# Patient Record
Sex: Female | Born: 1950 | Race: White | Hispanic: No | Smoking: Never smoker
Health system: Southern US, Community
[De-identification: ages and names within clinical notes are randomized; demographics above are authoritative.]

## PROBLEM LIST (undated history)

## (undated) DIAGNOSIS — E559 Vitamin D deficiency, unspecified: Secondary | ICD-10-CM

## (undated) DIAGNOSIS — R001 Bradycardia, unspecified: Secondary | ICD-10-CM

## (undated) DIAGNOSIS — E7800 Pure hypercholesterolemia, unspecified: Secondary | ICD-10-CM

## (undated) DIAGNOSIS — N6019 Diffuse cystic mastopathy of unspecified breast: Secondary | ICD-10-CM

## (undated) DIAGNOSIS — I341 Nonrheumatic mitral (valve) prolapse: Secondary | ICD-10-CM

## (undated) DIAGNOSIS — N200 Calculus of kidney: Secondary | ICD-10-CM

## (undated) DIAGNOSIS — Z124 Encounter for screening for malignant neoplasm of cervix: Secondary | ICD-10-CM

## (undated) DIAGNOSIS — R87619 Unspecified abnormal cytological findings in specimens from cervix uteri: Secondary | ICD-10-CM

## (undated) DIAGNOSIS — Z800 Family history of malignant neoplasm of digestive organs: Secondary | ICD-10-CM

## (undated) DIAGNOSIS — I499 Cardiac arrhythmia, unspecified: Secondary | ICD-10-CM

## (undated) DIAGNOSIS — G43909 Migraine, unspecified, not intractable, without status migrainosus: Secondary | ICD-10-CM

## (undated) DIAGNOSIS — Z780 Asymptomatic menopausal state: Secondary | ICD-10-CM

## (undated) DIAGNOSIS — H409 Unspecified glaucoma: Secondary | ICD-10-CM

## (undated) DIAGNOSIS — K648 Other hemorrhoids: Secondary | ICD-10-CM

## (undated) DIAGNOSIS — Z9289 Personal history of other medical treatment: Secondary | ICD-10-CM

## (undated) DIAGNOSIS — M810 Age-related osteoporosis without current pathological fracture: Secondary | ICD-10-CM

## (undated) DIAGNOSIS — Z803 Family history of malignant neoplasm of breast: Secondary | ICD-10-CM

## (undated) DIAGNOSIS — I4891 Unspecified atrial fibrillation: Secondary | ICD-10-CM

## (undated) DIAGNOSIS — J309 Allergic rhinitis, unspecified: Secondary | ICD-10-CM

## (undated) DIAGNOSIS — Z8041 Family history of malignant neoplasm of ovary: Secondary | ICD-10-CM

## (undated) DIAGNOSIS — H40003 Preglaucoma, unspecified, bilateral: Secondary | ICD-10-CM

## (undated) HISTORY — DX: Unspecified abnormal cytological findings in specimens from cervix uteri: R87.619

## (undated) HISTORY — DX: Family history of malignant neoplasm of ovary: Z80.41

## (undated) HISTORY — DX: Asymptomatic menopausal state: Z78.0

## (undated) HISTORY — DX: Calculus of kidney: N20.0

## (undated) HISTORY — PX: EYE SURGERY: SHX253

## (undated) HISTORY — DX: Preglaucoma, unspecified, bilateral: H40.003

## (undated) HISTORY — DX: Diffuse cystic mastopathy of unspecified breast: N60.19

## (undated) HISTORY — DX: Migraine, unspecified, not intractable, without status migrainosus: G43.909

## (undated) HISTORY — PX: OTHER SURGICAL HISTORY: SHX169

## (undated) HISTORY — DX: Personal history of other medical treatment: Z92.89

## (undated) HISTORY — PX: BREAST CYST ASPIRATION: SHX578

## (undated) HISTORY — DX: Age-related osteoporosis without current pathological fracture: M81.0

## (undated) HISTORY — DX: Nonrheumatic mitral (valve) prolapse: I34.1

## (undated) HISTORY — DX: Pure hypercholesterolemia, unspecified: E78.00

## (undated) HISTORY — DX: Family history of malignant neoplasm of digestive organs: Z80.0

## (undated) HISTORY — DX: Encounter for screening for malignant neoplasm of cervix: Z12.4

## (undated) HISTORY — DX: Vitamin D deficiency, unspecified: E55.9

## (undated) HISTORY — DX: Bradycardia, unspecified: R00.1

## (undated) HISTORY — PX: COLONOSCOPY: SHX174

## (undated) HISTORY — DX: Allergic rhinitis, unspecified: J30.9

## (undated) HISTORY — DX: Other hemorrhoids: K64.8

## (undated) HISTORY — DX: Family history of malignant neoplasm of breast: Z80.3

## (undated) HISTORY — DX: Unspecified atrial fibrillation: I48.91

## (undated) HISTORY — DX: Unspecified glaucoma: H40.9

---

## 1984-03-20 DIAGNOSIS — N200 Calculus of kidney: Secondary | ICD-10-CM

## 1984-03-20 HISTORY — DX: Calculus of kidney: N20.0

## 1997-03-20 DIAGNOSIS — R87619 Unspecified abnormal cytological findings in specimens from cervix uteri: Secondary | ICD-10-CM

## 1997-03-20 HISTORY — DX: Unspecified abnormal cytological findings in specimens from cervix uteri: R87.619

## 2000-02-21 ENCOUNTER — Encounter: Payer: Self-pay | Admitting: *Deleted

## 2000-02-21 ENCOUNTER — Encounter: Admission: RE | Admit: 2000-02-21 | Discharge: 2000-02-21 | Payer: Self-pay | Admitting: *Deleted

## 2000-05-08 LAB — HM COLONOSCOPY: HM Colonoscopy: NORMAL

## 2000-06-05 ENCOUNTER — Ambulatory Visit (HOSPITAL_COMMUNITY): Admission: RE | Admit: 2000-06-05 | Discharge: 2000-06-05 | Payer: Self-pay | Admitting: Gastroenterology

## 2001-03-18 ENCOUNTER — Other Ambulatory Visit: Admission: RE | Admit: 2001-03-18 | Discharge: 2001-03-18 | Payer: Self-pay | Admitting: *Deleted

## 2001-03-20 HISTORY — PX: CERVICAL POLYPECTOMY: SHX88

## 2001-11-18 HISTORY — PX: BREAST FIBROADENOMA SURGERY: SHX580

## 2002-09-08 ENCOUNTER — Other Ambulatory Visit: Admission: RE | Admit: 2002-09-08 | Discharge: 2002-09-08 | Payer: Self-pay

## 2003-11-24 ENCOUNTER — Other Ambulatory Visit: Admission: RE | Admit: 2003-11-24 | Discharge: 2003-11-24 | Payer: Self-pay | Admitting: Family Medicine

## 2004-01-19 HISTORY — PX: OTHER SURGICAL HISTORY: SHX169

## 2005-01-24 ENCOUNTER — Other Ambulatory Visit: Admission: RE | Admit: 2005-01-24 | Discharge: 2005-01-24 | Payer: Self-pay | Admitting: Family Medicine

## 2005-01-31 ENCOUNTER — Encounter: Admission: RE | Admit: 2005-01-31 | Discharge: 2005-01-31 | Payer: Self-pay | Admitting: Family Medicine

## 2007-03-21 DIAGNOSIS — E559 Vitamin D deficiency, unspecified: Secondary | ICD-10-CM

## 2007-03-21 HISTORY — DX: Vitamin D deficiency, unspecified: E55.9

## 2008-03-20 HISTORY — PX: OTHER SURGICAL HISTORY: SHX169

## 2009-04-13 LAB — HM PAP SMEAR: HM Pap smear: NORMAL

## 2009-04-14 DIAGNOSIS — Z124 Encounter for screening for malignant neoplasm of cervix: Secondary | ICD-10-CM

## 2009-04-14 HISTORY — DX: Encounter for screening for malignant neoplasm of cervix: Z12.4

## 2009-05-18 HISTORY — PX: DILATION AND CURETTAGE OF UTERUS: SHX78

## 2009-06-11 ENCOUNTER — Ambulatory Visit (HOSPITAL_COMMUNITY): Admission: RE | Admit: 2009-06-11 | Discharge: 2009-06-11 | Payer: Self-pay | Admitting: Obstetrics and Gynecology

## 2009-09-07 DIAGNOSIS — Z9289 Personal history of other medical treatment: Secondary | ICD-10-CM

## 2009-09-07 HISTORY — DX: Personal history of other medical treatment: Z92.89

## 2010-06-13 LAB — COMPREHENSIVE METABOLIC PANEL
ALT: 25 U/L (ref 0–35)
AST: 30 U/L (ref 0–37)
Albumin: 4.3 g/dL (ref 3.5–5.2)
Alkaline Phosphatase: 49 U/L (ref 39–117)
BUN: 13 mg/dL (ref 6–23)
CO2: 25 mEq/L (ref 19–32)
Calcium: 9 mg/dL (ref 8.4–10.5)
Chloride: 104 mEq/L (ref 96–112)
Creatinine, Ser: 0.87 mg/dL (ref 0.4–1.2)
GFR calc Af Amer: >60 mL/min (ref >60)
GFR calc non Af Amer: >60 mL/min (ref >60)
Glucose, Bld: 86 mg/dL (ref 70–99)
Potassium: 3.6 mEq/L (ref 3.5–5.1)
Sodium: 137 mEq/L (ref 135–145)
Total Bilirubin: 0.5 mg/dL (ref 0.3–1.2)
Total Protein: 7.6 g/dL (ref 6.0–8.3)

## 2010-06-13 LAB — CBC
HCT: 40.1 % (ref 36.0–46.0)
Hemoglobin: 13.4 g/dL (ref 12.0–15.0)
MCHC: 33.4 g/dL (ref 30.0–36.0)
MCV: 93.1 fL (ref 78.0–100.0)
Platelets: 306 10*3/uL (ref 150–400)
RBC: 4.3 MIL/uL (ref 3.87–5.11)
RDW: 13.1 % (ref 11.5–15.5)
WBC: 6 10*3/uL (ref 4.0–10.5)

## 2010-08-05 NOTE — Procedures (Signed)
[place]. [facility]  Patient:    Paige Klein, HEIDEL                  MRN: [id number] Proc. Date: 06/05/00 Adm. Date:  [id number] Attending:  Charna Elizabeth CC:         Heather Roberts, M.D.   Procedure Report  DATE OF BIRTH:  1950/05/04.  PROCEDURE:  Colonoscopy.  ENDOSCOPIST:  Anselmo Rod, M.D.  INSTRUMENT USED:  Olympus video colonoscope.  INDICATION FOR PROCEDURE:  Guaiac-positive stools in a 60 year old white female.  Rule out colonic polyps, masses, hemorrhoids, etc.  PREPROCEDURE PREPARATION:  Informed consent was procured from the patient. The patient was fasted for eight hours prior to the procedure and prepped with a bottle of magnesium citrate and a gallon of NuLytely the night prior to the procedure.  She also received 1 g of Ancef and 80 mg of gentamicin prior to the procedure.  PREPROCEDURE PHYSICAL:  VITAL SIGNS:  The patient had stable vital signs.  NECK:  Supple.  CHEST:  Clear to auscultation.  S1, S2 regular.  ABDOMEN:  Soft with normal abdominal bowel sounds.  DESCRIPTION OF PROCEDURE:  The patient was placed in the left lateral decubitus position and sedated with 50 mg of Demerol and 5 mg of Versed intravenously.  Once the patient was adequately sedate and maintained on low-flow oxygen and continuous cardiac monitoring, the Olympus video colonoscope was advanced from the rectum to the cecum without difficulty. The entire colonic mucosa up to the cecum appeared healthy with normal vascular pattern.  Small internal hemorrhoids were appreciated on retroflexion.  No masses, polyps, erosions, ulcerations, or diverticula were seen.  IMPRESSION:  Essentially healthy-appearing colon except for small, nonbleeding internal hemorrhoids.  RECOMMENDATIONS: 1. The patient has been advised to increase the fluid and fiber in her diet. 2. Repeat guaiac testing will be done on an outpatient basis and further    recommendations  made as needed. DD:  06/05/00 TD:  06/05/00 Job: [id number]

## 2010-10-20 ENCOUNTER — Ambulatory Visit (INDEPENDENT_AMBULATORY_CARE_PROVIDER_SITE_OTHER): Payer: BC Managed Care – PPO | Admitting: Family Medicine

## 2010-10-20 ENCOUNTER — Encounter: Payer: Self-pay | Admitting: Family Medicine

## 2010-10-20 VITALS — BP 100/60 | HR 60 | Ht 68.0 in | Wt 138.0 lb

## 2010-10-20 DIAGNOSIS — M810 Age-related osteoporosis without current pathological fracture: Secondary | ICD-10-CM

## 2010-10-20 DIAGNOSIS — Z0000 Encounter for general adult medical examination without abnormal findings: Secondary | ICD-10-CM

## 2010-10-20 DIAGNOSIS — E559 Vitamin D deficiency, unspecified: Secondary | ICD-10-CM | POA: Insufficient documentation

## 2010-10-20 LAB — POCT URINALYSIS DIPSTICK
Bilirubin, UA: NEGATIVE
Ketones, UA: NEGATIVE
Leukocytes, UA: NEGATIVE
Protein, UA: NEGATIVE
Spec Grav, UA: 1.02
pH, UA: 5

## 2010-10-20 MED ORDER — ALENDRONATE SODIUM 70 MG PO TABS: ORAL_TABLET | ORAL | Status: DC

## 2010-10-20 NOTE — Progress Notes (Signed)
Paige Klein is a 60 y.o. female who presents for a complete physical.  She has the following concerns:  She has been off the fosamax for a few months--ran out.  She took Actonel x 6 years, subsequently changed to Fosamax (insurance reasons).  She thinks she's been on medications for about 10 years.  For many years she took med with well water, and it was felt it wasn't being absorbed, bone density continued to decline.  Stabilized since changing to taking it with distilled water.  Complaining of some pain--L hip (better after getting some exercises).  Very sedentary. Pain lateral hip, intermittent. No pain with weight bearing, no pain in groin  Also takes compounded progesterone (bi-est (6:4)+progest+test 0.3mg +2%+0.025mg   1cc topically BID (last filled 10/07/10).  Still has some vaginal dryness  Immunization History  Administered Date(s) Administered  . Td 10/04/1990, 03/18/2001   Last Pap smear: 03/2009 Last mammogram: 2011--due now Last colonoscopy: 05/2000--due Last DEXA: 08/2009 Ophtho: every 6 months Dentist: regular Exercise: sporadic  Past Medical History  Diagnosis Date  . Unspecified vitamin D deficiency 2009  . Glaucoma suspect of both eyes     high-normal intraocular pressures (Dr. Emily Filbert)  . Abnormal Pap smear of cervix 1999    ASGUS x 1  . Kidney stone 1986    right  . Osteoporosis   . Allergic rhinitis, cause unspecified     mold; and vasomotor rhinitis  . Migraine headache     resolved with menopause (menstrual migraines)  . MVP (mitral valve prolapse)     per echo  . Fibrocystic breast changes     and also breast calcifications (08/2008)  . Postmenopausal     Past Surgical History  Procedure Date  . Cervical polypectomy 2003    treated with cryosurgery  . Colonoscopy 05/2000    Dr. Loreta Ave (normal)  . Breast cyst aspiration     many (Dr. Maryagnes Amos)  . Endocervical cyst 12/96, 6/97    Dr Chevis Pretty  . Cardiolite (stress test) 11/05    Dr. Ermalinda Memos  . Cataract  surgery 2010    bilateral    History   Social History  . Marital Status: Divorced    Spouse Name: N/A    Number of Children: 0  . Years of Education: N/A   Occupational History  . school psychologist Agricultural engineer) [employer]   Social History Main Topics  . Smoking status: Never Smoker   . Smokeless tobacco: Never Used  . Alcohol Use: Yes     2-3 drinks once a week.  . Drug Use: No  . Sexually Active: Yes   Other Topics Concern  . Not on file   Social History Narrative  . No narrative on file    Family History  Problem Relation Age of Onset  . Hypertension Mother   . Arthritis Mother   . Cancer Mother     breast cancer in her 69's and 63's (bilateral)  . Hypertension Father   . Lichen planus Sister     Current outpatient prescriptions:latanoprost (XALATAN) 0.005 % ophthalmic solution, Place 1 drop into both eyes at bedtime.  , Disp: , Rfl: ;  Multiple Minerals-Vitamins (CITRACAL PLUS) TABS, Take 1 tablet by mouth daily.  , Disp: , Rfl: ;  Multiple Vitamins-Minerals (CENTRUM SILVER PO), Take 1 tablet by mouth daily.  , Disp: , Rfl: ;  alendronate (FOSAMAX) 70 MG tablet, Take with a full glass of water on an empty stomach., Disp: 4 tablet, Rfl:  11  Allergies  Allergen Reactions  . Cortisone Other (See Comments)    Entire body hurt from a cortisone shot.    ROS: The patient denies anorexia, fever, weight changes, headaches,  vision changes, decreased hearing, ear pain, sore throat, breast concerns, chest pain, palpitations, dizziness, syncope, dyspnea on exertion, cough, swelling, nausea, vomiting, diarrhea, constipation, abdominal pain, melena, hematochezia, indigestion/heartburn, hematuria,  Dysuria, postmenopausal bleeding, vaginal discharge, odor or itch, genital lesions, numbness, tingling, weakness, tremor, suspicious skin lesions, depression, anxiety, abnormal bleeding/bruising, or enlarged lymph nodes.  Occasional L hip pain.  Rare leakage of urine if  holds urine too long  PHYSICAL EXAM: BP 100/60  Pulse 60  Ht 5\' 8"  (1.727 m)  Wt 138 lb (62.596 kg)  BMI 20.98 kg/m2  LMP 03/20/2000  General Appearance:    Alert, cooperative, no distress, appears stated age  Head:    Normocephalic, without obvious abnormality, atraumatic  Eyes:    PERRL, conjunctiva/corneas clear, EOM's intact, fundi    benign  Ears:    Normal TM's and external ear canals  Nose:   Nares normal, mucosa normal, no drainage or sinus   tenderness  Throat:   Lips, mucosa, and tongue normal; teeth and gums normal  Neck:   Supple, no lymphadenopathy;  thyroid:  no   enlargement/tenderness/nodules; no carotid   bruit or JVD  Back:    Spine nontender, no curvature, ROM normal, no CVA     tenderness  Lungs:     Clear to auscultation bilaterally without wheezes, rales or     ronchi; respirations unlabored  Chest Wall:    No tenderness or deformity   Heart:    Regular rate and rhythm, S1 and S2 normal, no murmur, rub   or gallop.  Bradycardic (rate in low 50's at time of my exam)  Breast Exam:    No tenderness, masses, or nipple discharge or inversion.      No axillary lymphadenopathy  Abdomen:     Soft, non-tender, nondistended, normoactive bowel sounds,    no masses, no hepatosplenomegaly  Genitalia:    Normal external genitalia without lesions.  Mild atrophic changes. Bimanual exam performed (no speculum exam)-- Uterus and adnexa not enlarged, nontender, no masses.  Pap not performed  Rectal:    Normal tone, no masses or tenderness; guaiac negative stool  Extremities:   No clubbing, cyanosis or edema.  Nontender at ITB and trochanteric bursa  Pulses:   2+ and symmetric all extremities  Skin:   Skin color, texture, turgor normal, no rashes or lesions  Lymph nodes:   Cervical, supraclavicular, and axillary nodes normal  Neurologic:   CNII-XII intact, normal strength, sensation and gait; reflexes 2+ and symmetric throughout          Psych:   Normal mood, affect, hygiene and  grooming.    ASSESSMENT/PLAN: 1. Routine general medical examination at a health care facility  Visual acuity screening, POCT urinalysis dipstick  2. Osteoporosis  alendronate (FOSAMAX) 70 MG tablet  3. Unspecified vitamin D deficiency  Vitamin D 25 hydroxy   Discussed monthly self breast exams and yearly mammograms; at least 30 minutes of aerobic activity at least 5 days/week; proper sunscreen use reviewed; healthy diet, including goals of calcium and vitamin D intake and alcohol recommendations (less than or equal to 1 drink/day) reviewed; regular seatbelt use; changing batteries in smoke detectors.  Immunization recommendations discussed--zostavax recommended.  Colonoscopy recommendations reviewed--past due; to call and schedule.  Zostavax discussed and recommended--check insurance.  Continue yearly flu shots  Osteoporosis:  Getting suboptimal amts of calcium (only taking 250mg  of Ca and 125 IU of Vit D by only taking 1 Citrical daily). Restart fosamax and re-check DEXA 08/2011.  Monitor Vitamin D levels.  Normal lipids (borderline, but LDL always <130) in past, normal CBC, c-met and TSH done yearly so will skip other labs this year.  No recent abnormal paps, can screen q2-3 years  Hemoccult kit given

## 2010-10-20 NOTE — Patient Instructions (Addendum)
Ensure that you are getting 1500 mg of calcium daily (look closely at labels of vitamins/supplements)--this includes your dietary intake Vitamin D 1000 IU daily Re-start the Fosamax Re-check DEXA in 1 year Please schedule your mammogram and your colonoscopy Check with your insurance regarding Zostavax (shingles vaccine) coverage   HEALTH MAINTENANCE RECOMMENDATIONS:  It is recommended that you get at least 30 minutes of aerobic exercise at least 5 days/week (for weight loss, you may need as much as 60-90 minutes). This can be any activity that gets your heart rate up. This can be divided in 10-15 minute intervals if needed, but try and build up your endurance at least once a week.  Weight bearing exercise is also recommended twice weekly.  Eat a healthy diet with lots of vegetables, fruits and fiber.  "Colorful" foods have a lot of vitamins (ie green vegetables, tomatoes, red peppers, etc).  Limit sweet tea, regular sodas and alcoholic beverages, all of which has a lot of calories and sugar.  Up to 1 alcoholic drink daily may be beneficial for women (unless trying to lose weight, watch sugars).  Drink a lot of water.  Calcium recommendations are 1200-1500 mg daily (1500 mg for postmenopausal women or women without ovaries), and vitamin D 1000 IU daily.  This should be obtained from diet and/or supplements (vitamins), and calcium should not be taken all at once, but in divided doses.  Monthly self breast exams and yearly mammograms for women over the age of 40 is recommended.  Sunscreen of at least SPF 30 should be used on all sun-exposed parts of the skin when outside between the hours of 10 am and 4 pm (not just when at beach or pool, but even with exercise, golf, tennis, and yard work!)  Use a sunscreen that says "broad spectrum" so it covers both UVA and UVB rays, and make sure to reapply every 1-2 hours.  Remember to change the batteries in your smoke detectors when changing your clock times  in the spring and fall.  Use your seat belt every time you are in a car, and please drive safely and not be distracted with cell phones and texting while driving.  Return stool cards

## 2010-10-21 ENCOUNTER — Encounter: Payer: Self-pay | Admitting: Family Medicine

## 2010-10-21 LAB — VITAMIN D 25 HYDROXY (VIT D DEFICIENCY, FRACTURES): Vit D, 25-Hydroxy: 30 ng/mL (ref 30–89)

## 2010-11-22 ENCOUNTER — Encounter: Payer: Self-pay | Admitting: Family Medicine

## 2010-11-22 ENCOUNTER — Other Ambulatory Visit: Payer: BC Managed Care – PPO

## 2010-11-22 DIAGNOSIS — K921 Melena: Secondary | ICD-10-CM

## 2010-11-22 LAB — POC HEMOCCULT BLD/STL (HOME/3-CARD/SCREEN)
Card #2 Fecal Occult Blod, POC: NEGATIVE
Card #3 Fecal Occult Blood, POC: NEGATIVE
Fecal Occult Blood, POC: NEGATIVE

## 2010-11-23 DIAGNOSIS — Z9289 Personal history of other medical treatment: Secondary | ICD-10-CM

## 2010-11-23 HISTORY — DX: Personal history of other medical treatment: Z92.89

## 2010-12-13 ENCOUNTER — Telehealth: Payer: Self-pay | Admitting: Family Medicine

## 2010-12-13 NOTE — Telephone Encounter (Signed)
Please see if pt has had her mammogram done.  Must have yearly mammograms in order to get refills of hormones.  I see order in computer--I don't have chart to see if paper copy came across.  If had mammogram recently (and is UTD), then okay to refill until next mammogram is due (1 year)

## 2010-12-13 NOTE — Telephone Encounter (Signed)
Called patient to see whether or not she was UTD on her mammogram, had to leave message on patient's answering machine.

## 2010-12-14 ENCOUNTER — Telehealth: Payer: Self-pay | Admitting: Family Medicine

## 2010-12-14 NOTE — Telephone Encounter (Signed)
Pt called and said she just had a mammogram in September 2012 at [place].  Please renew meds.

## 2010-12-15 ENCOUNTER — Other Ambulatory Visit: Payer: Self-pay | Admitting: *Deleted

## 2010-12-15 DIAGNOSIS — Z780 Asymptomatic menopausal state: Secondary | ICD-10-CM

## 2010-12-15 MED ORDER — AMBULATORY NON FORMULARY MEDICATION: 1.0000 mL | Freq: Two times a day (BID) | Status: DC

## 2010-12-15 NOTE — Telephone Encounter (Signed)
rx faxed to [facility] @ [phone]. Bi-est(6:4)+Progest+Test0.3+ 2%+0.025mg  Cream. 60quantity apply 1cc topically twice a day(rotate sites)

## 2011-02-06 ENCOUNTER — Telehealth: Payer: Self-pay | Admitting: Family Medicine

## 2011-02-06 NOTE — Telephone Encounter (Signed)
OV here is recommended (last seen/discussed in August)

## 2011-02-06 NOTE — Telephone Encounter (Signed)
Called patient and scheduled appt to see Dr.Knapp 02/22/11 @ 2:45pm.

## 2011-02-20 ENCOUNTER — Encounter: Payer: Self-pay | Admitting: Internal Medicine

## 2011-02-22 ENCOUNTER — Ambulatory Visit (INDEPENDENT_AMBULATORY_CARE_PROVIDER_SITE_OTHER): Payer: BC Managed Care – PPO | Admitting: Family Medicine

## 2011-02-22 ENCOUNTER — Encounter: Payer: Self-pay | Admitting: Family Medicine

## 2011-02-22 VITALS — BP 116/70 | HR 72 | Ht 68.0 in | Wt 145.0 lb

## 2011-02-22 DIAGNOSIS — M25559 Pain in unspecified hip: Secondary | ICD-10-CM

## 2011-02-22 DIAGNOSIS — M25552 Pain in left hip: Secondary | ICD-10-CM

## 2011-02-22 MED ORDER — NAPROXEN 500 MG PO TABS: 500.0000 mg | ORAL_TABLET | Freq: Two times a day (BID) | ORAL | Status: DC

## 2011-02-22 NOTE — Progress Notes (Signed)
Patient presents with complaint of L hip pain for 6-8 months.  Pain is worse with sitting, but also notices while lying in bed.  Hurts to lay down on her left side.  Has some pain with she rotates her hip externally.  Has pain when the cat sits on her lap when she sits cross legged.  Has been taking yoga classes, and had some pain with abduction of the hip, keeping her leg elevated against gravity.  Described as an achey discomfort.  Has had some cramping in her thigh, although this is much improved, no other radiation of pain.  Pain was worse when she moved in the Spring, and then sat for prolonged periods of time at work.  She hasn't taken any kind of anti-inflammatories.  Ice helped some  Past Medical History  Diagnosis Date  . Unspecified vitamin D deficiency 2009  . Glaucoma suspect of both eyes     high-normal intraocular pressures (Dr. Emily Filbert)  . Abnormal Pap smear of cervix 1999    ASGUS x 1  . Kidney stone 1986    right  . Osteoporosis   . Allergic rhinitis, cause unspecified     mold; and vasomotor rhinitis  . Migraine headache     resolved with menopause (menstrual migraines)  . MVP (mitral valve prolapse)     per echo  . Fibrocystic breast changes     and also breast calcifications (08/2008)  . Postmenopausal   . Cataract   . Internal hemorrhoids     Past Surgical History  Procedure Date  . Cervical polypectomy 2003    treated with cryosurgery  . Colonoscopy 05/2000    Dr. Loreta Ave (normal)  . Breast cyst aspiration     many (Dr. Maryagnes Amos)  . Endocervical cyst 12/96, 6/97    Dr Chevis Pretty  . Cardiolite (stress test) 11/05    Dr. Ermalinda Memos  . Cataract surgery 2010    bilateral  . Breast fibroadenoma surgery 09-03    Dr.Gooden    History   Social History  . Marital Status: Divorced    Spouse Name: N/A    Number of Children: 0  . Years of Education: N/A   Occupational History  . school psychologist Agricultural engineer) [employer]   Social History Main Topics  .  Smoking status: Never Smoker   . Smokeless tobacco: Never Used  . Alcohol Use: Yes     2-3 drinks once a week.  . Drug Use: No  . Sexually Active: Yes   Other Topics Concern  . Not on file   Social History Narrative  . No narrative on file    Family History  Problem Relation Age of Onset  . Hypertension Mother   . Arthritis Mother   . Cancer Mother     breast cancer in her 43's and 81's (bilateral)  . Hypertension Father   . Lichen planus Sister   . Celiac disease Maternal Uncle   . Cancer Maternal Grandmother     cervical and ovarian  . Cancer Maternal Grandfather     pancreatic    Current outpatient prescriptions:alendronate (FOSAMAX) 70 MG tablet, Take with a full glass of water on an empty stomach., Disp: 4 tablet, Rfl: 11;  AMBULATORY NON FORMULARY MEDICATION, Apply 1 mL topically 2 (two) times daily. BI-EST(6:4)+PROGEST+TEST 0.3MG  = 2% + 0.025MG  CREAM. 60 QUANTITY., Disp: 60 mL, Rfl: PRN;  latanoprost (XALATAN) 0.005 % ophthalmic solution, Place 1 drop into both eyes at bedtime.  , Disp: ,  Rfl:  LUTEIN PO, Take by mouth.  , Disp: , Rfl: ;  Multiple Minerals-Vitamins (CITRACAL PLUS) TABS, Take 1 tablet by mouth daily.  , Disp: , Rfl: ;  Multiple Vitamins-Minerals (CENTRUM SILVER PO), Take 1 tablet by mouth daily.  , Disp: , Rfl: ;  naproxen (NAPROSYN) 500 MG tablet, Take 1 tablet (500 mg total) by mouth 2 (two) times daily with a meal., Disp: 30 tablet, Rfl: 1  Allergies  Allergen Reactions  . Cortisone Other (See Comments)    Entire body hurt from a cortisone shot.   ROS:  Denies fevers, URI symptoms, GI complaints, other joint complaints except as per HPI.  PHYSICAL EXAM: BP 116/70  Pulse 72  Ht 5\' 8"  (1.727 m)  Wt 145 lb (65.772 kg)  BMI 22.05 kg/m2  LMP 03/20/2000 Well developed, pleasant female in no distress Left hip:  Full range of motion without pain.  Area of discomfort is over trochanteric bursa and into thigh, but not tender on exam.  Some discomfort  with hip abduction.  Normal strength.  Spine, SI joints nontender.  DTR's 2+, normal strength and sensation Skin: intact, no rash  ASSESSMENT/PLAN:  1. Hip pain, left  naproxen (NAPROSYN) 500 MG tablet    L hip pain--suspect either tendinitis or bursitis.  Will treat with NSAID's.  NSAID precautions reviewed.  If ongoing pain, recommend she call for hip x-ray and referral to PT.  Will give NSAID's a chance first. Continue with stretching, ice/heat

## 2011-02-22 NOTE — Patient Instructions (Signed)
Take medicine with food.  Do not take any other pain medications other than Tylenol.  Stop the medication (or decrease dose) if you develop stomach pain. If ongoing hip pain despite trial of 10-14 days of this medication, call to get order for hip x-ray, and referral to physical therapy.  Try ice and/or heat, and continue stretches.  Avoid activities which worsen pain

## 2011-03-21 ENCOUNTER — Emergency Department (HOSPITAL_COMMUNITY)
Admission: EM | Admit: 2011-03-21 | Discharge: 2011-03-21 | Disposition: A | Discharge status: Home / Self Care | Payer: No Typology Code available for payment source | Attending: Emergency Medicine | Admitting: Emergency Medicine

## 2011-03-21 ENCOUNTER — Encounter (HOSPITAL_COMMUNITY): Payer: Self-pay | Admitting: Emergency Medicine

## 2011-03-21 ENCOUNTER — Emergency Department (HOSPITAL_COMMUNITY): Payer: No Typology Code available for payment source

## 2011-03-21 DIAGNOSIS — M542 Cervicalgia: Secondary | ICD-10-CM | POA: Insufficient documentation

## 2011-03-21 DIAGNOSIS — Z9889 Other specified postprocedural states: Secondary | ICD-10-CM | POA: Insufficient documentation

## 2011-03-21 DIAGNOSIS — S22009A Unspecified fracture of unspecified thoracic vertebra, initial encounter for closed fracture: Secondary | ICD-10-CM | POA: Insufficient documentation

## 2011-03-21 DIAGNOSIS — M4854XA Collapsed vertebra, not elsewhere classified, thoracic region, initial encounter for fracture: Secondary | ICD-10-CM

## 2011-03-21 DIAGNOSIS — M549 Dorsalgia, unspecified: Secondary | ICD-10-CM | POA: Insufficient documentation

## 2011-03-21 DIAGNOSIS — R079 Chest pain, unspecified: Secondary | ICD-10-CM | POA: Insufficient documentation

## 2011-03-21 DIAGNOSIS — M810 Age-related osteoporosis without current pathological fracture: Secondary | ICD-10-CM | POA: Insufficient documentation

## 2011-03-21 DIAGNOSIS — S22000A Wedge compression fracture of unspecified thoracic vertebra, initial encounter for closed fracture: Secondary | ICD-10-CM | POA: Insufficient documentation

## 2011-03-21 DIAGNOSIS — R109 Unspecified abdominal pain: Secondary | ICD-10-CM | POA: Insufficient documentation

## 2011-03-21 LAB — CBC
HCT: 38.5 % (ref 36.0–46.0)
MCHC: 33 g/dL (ref 30.0–36.0)
Platelets: 233 10*3/uL (ref 150–400)
RDW: 13 % (ref 11.5–15.5)
WBC: 5.7 10*3/uL (ref 4.0–10.5)

## 2011-03-21 LAB — DIFFERENTIAL
Basophils Absolute: 0 10*3/uL (ref 0.0–0.1)
Basophils Relative: 0 % (ref 0–1)
Lymphocytes Relative: 31 % (ref 12–46)
Monocytes Absolute: 0.4 10*3/uL (ref 0.1–1.0)
Neutro Abs: 3.3 10*3/uL (ref 1.7–7.7)
Neutrophils Relative %: 59 % (ref 43–77)

## 2011-03-21 LAB — COMPREHENSIVE METABOLIC PANEL
ALT: 28 U/L (ref 0–35)
AST: 35 U/L (ref 0–37)
Albumin: 3.9 g/dL (ref 3.5–5.2)
Chloride: 105 mEq/L (ref 96–112)
Creatinine, Ser: 0.89 mg/dL (ref 0.50–1.10)
Sodium: 139 mEq/L (ref 135–145)
Total Bilirubin: 0.2 mg/dL — ABNORMAL LOW (ref 0.3–1.2)

## 2011-03-21 MED ORDER — TRAMADOL HCL 50 MG PO TABS: 50.0000 mg | ORAL_TABLET | Freq: Four times a day (QID) | ORAL | Status: AC | PRN

## 2011-03-21 MED ORDER — IOHEXOL 300 MG/ML  SOLN
80.0000 mL | Freq: Once | INTRAMUSCULAR | Status: AC | PRN
  Administered 2011-03-21: 80 mL via INTRAVENOUS

## 2011-03-21 NOTE — Progress Notes (Signed)
Trauma page: chaplain spoke briefly with patient who stated that she was coping okay. Patient asked chaplain to let her companion, Bing Plume (also in the vehicle with her), know that she is here in the [facility]. Chaplain did so. Patient declined to have any family or friends contacted at this time. Follow up as needed.

## 2011-03-21 NOTE — ED Notes (Signed)
Pt logrolled from backboard. Pt denies pain. No step offs or crepitus.

## 2011-03-21 NOTE — ED Notes (Signed)
Patient transported to CT 

## 2011-03-21 NOTE — ED Notes (Signed)
Pt placed in Aspen collar per Neuro, pt tolerated well
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
## 2011-03-22 ENCOUNTER — Encounter: Payer: Self-pay | Admitting: Family Medicine

## 2011-06-29 ENCOUNTER — Ambulatory Visit (INDEPENDENT_AMBULATORY_CARE_PROVIDER_SITE_OTHER): Payer: BC Managed Care – PPO | Admitting: Family Medicine

## 2011-06-29 ENCOUNTER — Encounter: Payer: Self-pay | Admitting: Family Medicine

## 2011-06-29 VITALS — BP 118/72 | HR 68 | Ht 68.0 in | Wt 139.0 lb

## 2011-06-29 DIAGNOSIS — N952 Postmenopausal atrophic vaginitis: Secondary | ICD-10-CM

## 2011-06-29 NOTE — Patient Instructions (Signed)
Increase hormones to twice daily as was originally prescribed.  Hopefully symptoms should improve in the next 1-2 weeks.  There is no evidence of infection just atrophic changes.  Atrophic Vaginitis Atrophic vaginitis is a problem of low levels of estrogen in women. This problem can happen at any age. It is most common in women who have gone through menopause ("the change").  HOW WILL I KNOW IF I HAVE THIS PROBLEM? You may have:  Trouble with peeing (urinating), such as:   Going to the bathroom often.   A hard time holding your pee until you reach a bathroom.   Leaking pee.   Having pain when you pee.   Itching or a burning feeling.   Vaginal bleeding and spotting.   Pain during sex.   Dryness of the vagina.   A yellow, bad-smelling fluid (discharge) coming from the vagina.  HOW WILL MY DOCTOR CHECK FOR THIS PROBLEM?  During your exam, your doctor will likely find the problem.   If there is a vaginal fluid, it may be checked for infection.  HOW WILL THIS PROBLEM BE TREATED? Keep the vulvar skin as clean as possible. Moisturizers and lubricants can help with some of the symptoms. Estrogen replacement can help. There are 2 ways to take estrogen:  Systemic estrogen gets estrogen to your whole body. It takes many weeks or months before the symptoms get better.   You take an estrogen pill.   You use a skin patch. This is a patch that you put on your skin.   If you still have your uterus, your doctor may ask you to take a hormone. Talk to your doctor about the right medicine for you.   Estrogen cream.  This puts estrogen only at the part of your body where you apply it. The cream is put into the vagina or put on the vulvar skin. For some women, estrogen cream works faster than pills or the patch.  CAN ALL WOMEN WITH THIS PROBLEM USE ESTROGEN? No. Women with certain types of cancer, liver problems, or problems with blood clots should not take estrogen. Your doctor can help you  decide the best treatment for your symptoms. Document Released: 08/23/2007 Document Revised: 02/23/2011 Document Reviewed: 08/23/2007 [facility] Patient Information 2012 [place], [place].

## 2011-06-29 NOTE — Progress Notes (Signed)
Patient presents with complaint of vaginal irritation.  Feels internal, not just a skin irritation.  Symptoms have been going on for about a month.  Initially thought it was due to a change in soap, but ongoing symptoms despite changing back to previous soap.  Has some clear/white discharge, notices "moisture" in her panties, thinks it may be perspiration, doesn't seem to have discharge..  Denies itching, just some burning.  Denies any significant odor.  On topical HRT with testosterone---hasn't changed (has been using since approx 2005.), although admits to only using it once daily, when it has been prescribed to BID dosing.  She is in a monogamous relationship x 2 years.  Doesn't think there is possibility of STD.  Past Medical History  Diagnosis Date  . Unspecified vitamin D deficiency 2009  . Glaucoma suspect of both eyes     high-normal intraocular pressures (Dr. Emily Filbert)  . Abnormal Pap smear of cervix 1999    ASGUS x 1  . Kidney stone 1986    right  . Osteoporosis   . Allergic rhinitis, cause unspecified     mold; and vasomotor rhinitis  . Migraine headache     resolved with menopause (menstrual migraines)  . MVP (mitral valve prolapse)     per echo  . Fibrocystic breast changes     and also breast calcifications (08/2008)  . Postmenopausal   . Cataract   . Internal hemorrhoids    Past Surgical History  Procedure Date  . Cervical polypectomy 2003    treated with cryosurgery  . Colonoscopy 05/2000    Dr. Loreta Ave (normal)  . Breast cyst aspiration     many (Dr. Maryagnes Amos)  . Endocervical cyst 12/96, 6/97    Dr Chevis Pretty  . Cardiolite (stress test) 11/05    Dr. Ermalinda Memos  . Cataract surgery 2010    bilateral  . Breast fibroadenoma surgery 09-03    Dr.Gooden   History   Social History  . Marital Status: Divorced    Spouse Name: N/A    Number of Children: 0  . Years of Education: N/A   Occupational History  . school psychologist Agricultural engineer) [employer]   Social  History Main Topics  . Smoking status: Never Smoker   . Smokeless tobacco: Never Used  . Alcohol Use: Yes     2-3 drinks once a week.  . Drug Use: No  . Sexually Active: Yes   Other Topics Concern  . Not on file   Social History Narrative  . No narrative on file   Current Outpatient Prescriptions on File Prior to Visit  Medication Sig Dispense Refill  . alendronate (FOSAMAX) 70 MG tablet Take 70 mg by mouth every 7 (seven) days. Wed or Thurs Take with a full glass of water on an empty stomach.       . AMBULATORY NON FORMULARY MEDICATION Apply 1 mL topically 2 (two) times daily. BI-EST(6:4)+PROGEST+TEST 0.3MG  = 2% + 0.025MG  CREAM. 60 QUANTITY.  60 mL  PRN  . latanoprost (XALATAN) 0.005 % ophthalmic solution Place 1 drop into both eyes at bedtime.        . LUTEIN PO Take by mouth.        . Multiple Minerals-Vitamins (CITRACAL PLUS) TABS Take 1 tablet by mouth daily.        . Multiple Vitamins-Minerals (CENTRUM SILVER PO) Take 1 tablet by mouth daily.        . naproxen (NAPROSYN) 500 MG tablet Take 1 tablet (500 mg  total) by mouth 2 (two) times daily with a meal.  30 tablet  1   Allergies  Allergen Reactions  . Cortisone Other (See Comments)    Entire body hurt from a cortisone shot.  . Tramadol Rash   ROS:  Denies dysuria, fevers, skin rashes.  Denies fevers, URI symptoms, chest pain or shortness of breath.  Hip is much better (last seen in December with pain), only occasionally notices slight discomfort.  No longer using NSAIDs.  PHYSICAL EXAM: BP 118/72  Pulse 68  Ht 5\' 8"  (1.727 m)  Wt 139 lb (63.05 kg)  BMI 21.13 kg/m2  LMP 03/20/2000 Well developed, pleasant female in no distress External genitalia: Very slight perineal erythema.  Atrophic changes noted.  No vaginal discharge is seen, some atrophic vaginal changes.  ASSESSMENT/PLAN:  1. Atrophic vaginitis    Increase hormones to BID as was originally prescribed (has only been using once daily).  Hopefully symptoms  should improve in the next 1-2 weeks.  There is no evidence of infection just atrophic changes.

## 2011-07-07 ENCOUNTER — Telehealth: Payer: Self-pay | Admitting: Internal Medicine

## 2011-07-07 NOTE — Telephone Encounter (Signed)
Faxed in pharmacy form to [facility] with medication and directions with 4 refills. Wasn't sure how to put in computer.

## 2011-07-07 NOTE — Telephone Encounter (Signed)
Okay to refill x 4 (I don't know how to put this in computer). Thanks

## 2011-08-02 LAB — HM COLONOSCOPY

## 2011-11-02 ENCOUNTER — Other Ambulatory Visit: Payer: Self-pay | Admitting: Family Medicine

## 2012-02-08 ENCOUNTER — Telehealth: Payer: Self-pay | Admitting: Internal Medicine

## 2012-02-08 NOTE — Telephone Encounter (Signed)
Last CPE was 10/2010.  I see no record of mammo within the year.  I can't rx HRT cream without CPE and updated mammograms

## 2012-02-08 NOTE — Telephone Encounter (Signed)
Left messages on patient's home and cell numbers asking her to please return my call and the reason for my call.

## 2012-02-08 NOTE — Telephone Encounter (Signed)
Pt scheduled an appt in December with shane

## 2012-02-21 ENCOUNTER — Encounter: Payer: Self-pay | Admitting: Medical

## 2012-02-21 ENCOUNTER — Ambulatory Visit (INDEPENDENT_AMBULATORY_CARE_PROVIDER_SITE_OTHER): Payer: BC Managed Care – PPO | Admitting: Medical

## 2012-02-21 VITALS — BP 110/70 | HR 60 | Resp 14 | Ht 68.5 in | Wt 144.0 lb

## 2012-02-21 DIAGNOSIS — M810 Age-related osteoporosis without current pathological fracture: Secondary | ICD-10-CM

## 2012-02-21 DIAGNOSIS — Z780 Asymptomatic menopausal state: Secondary | ICD-10-CM

## 2012-02-21 DIAGNOSIS — Z23 Encounter for immunization: Secondary | ICD-10-CM

## 2012-02-21 DIAGNOSIS — Z0000 Encounter for general adult medical examination without abnormal findings: Secondary | ICD-10-CM

## 2012-02-21 DIAGNOSIS — T887XXA Unspecified adverse effect of drug or medicament, initial encounter: Secondary | ICD-10-CM

## 2012-02-21 DIAGNOSIS — R002 Palpitations: Secondary | ICD-10-CM

## 2012-02-21 DIAGNOSIS — E559 Vitamin D deficiency, unspecified: Secondary | ICD-10-CM

## 2012-02-21 DIAGNOSIS — T50905A Adverse effect of unspecified drugs, medicaments and biological substances, initial encounter: Secondary | ICD-10-CM

## 2012-02-21 DIAGNOSIS — Z1239 Encounter for other screening for malignant neoplasm of breast: Secondary | ICD-10-CM

## 2012-02-21 LAB — POCT URINALYSIS DIPSTICK
Bilirubin, UA: NEGATIVE
Ketones, UA: NEGATIVE
Leukocytes, UA: NEGATIVE
Spec Grav, UA: 1.015

## 2012-02-21 LAB — CBC WITH DIFFERENTIAL/PLATELET
Basophils Relative: 1 % (ref 0–1)
Eosinophils Absolute: 0.1 10*3/uL (ref 0.0–0.7)
Eosinophils Relative: 2 % (ref 0–5)
HCT: 38.8 % (ref 36.0–46.0)
Hemoglobin: 13 g/dL (ref 12.0–15.0)
MCH: 30.6 pg (ref 26.0–34.0)
MCHC: 33.5 g/dL (ref 30.0–36.0)
MCV: 91.3 fL (ref 78.0–100.0)
Monocytes Absolute: 0.4 10*3/uL (ref 0.1–1.0)
Monocytes Relative: 8 % (ref 3–12)

## 2012-02-21 LAB — BASIC METABOLIC PANEL
CO2: 26 mEq/L (ref 19–32)
Chloride: 107 mEq/L (ref 96–112)
Sodium: 141 mEq/L (ref 135–145)

## 2012-02-21 NOTE — Progress Notes (Signed)
Subjective: Paige Klein is a 62 y.o. female who presents for a complete physical.  Saw Dr. Lynelle Doctor last year for physical, has not had a consistent routine PCM the last few years, has seen various providers, Dr. Merrilee Jansky, Dr. Wynetta Emery.  She has the following concerns:  She has been off the fosamax for a few months.  She took Actonel x 6 years, subsequently changed to Fosamax (insurance reasons).  She thinks she's been on medications for about 10 years.  For many years she took med with well water, and it was felt it wasn't being absorbed, bone density continued to decline.  Stabilized since changing to taking it with distilled water.  However, she stopped Fosamax in September after reading about possible adverse effects.  She says since stopping Fosamax, has no more GI upset, bone pains/aches are better, and left hip pain is better.  Ongoing left hip pain, but seems to be better after stopping Fosamax few months ago.    Postmenopausal with atrophic vaginitis, uses compounded progesterone (bi-est (6:4)+progest+test 0.3mg +2%+0.025mg   1cc topically BID  She was advised last year to check on zostavax coverage, but she has not done this.  She sees eye doctor regularly, on medication for suspected glaucoma.   Sees dentist regularly.  Has done yoga for 25 years, was exercising regularly, but lately with added stress, hasn't been exercising as much.  Past Medical History  Diagnosis Date  . Unspecified vitamin D deficiency 2009  . Glaucoma suspect of both eyes     high-normal intraocular pressures (Dr. Emily Filbert)  . Abnormal Pap smear of cervix 1999    ASGUS x 1  . Kidney stone 1986    right  . Osteoporosis   . Allergic rhinitis, cause unspecified     mold; and vasomotor rhinitis  . Migraine headache     resolved with menopause (menstrual migraines)  . MVP (mitral valve prolapse)     per echo  . Fibrocystic breast changes     and also breast calcifications (08/2008)  . Postmenopausal     on  compounded cream daily  . Cataract   . Internal hemorrhoids   . H/O mammogram 11/23/10    normal  . Pap smear for cervical cancer screening 04/14/09    negative for malignancy, +atrophic changes  . H/O bone density study 09/07/09    08/16/07, 08/08/05; osteoporosis  . Hypercholesteremia     borderline per NMR lipoprofile 2008, 2007    Past Surgical History  Procedure Date  . Cervical polypectomy 2003    treated with cryosurgery  . Colonoscopy 08/02/11; 05/2000    Dr. Loreta Ave - rectal polyps 2013  . Breast cyst aspiration     many (Dr. Maryagnes Amos)  . Endocervical cyst 12/96, 6/97    Dr Chevis Pretty  . Cardiolite (stress test) 11/05    Dr. Ermalinda Memos  . Cataract surgery 2010    bilateral  . Breast fibroadenoma surgery 09-03    Dr.Gooden  . Dilation and curettage of uterus 05/2009    uterine polyp, bleeding    History   Social History  . Marital Status: Divorced    Spouse Name: N/A    Number of Children: 0  . Years of Education: N/A   Occupational History  . school psychologist Agricultural engineer) [employer]   Social History Main Topics  . Smoking status: Never Smoker   . Smokeless tobacco: Never Used  . Alcohol Use: Yes     Comment: 2-3 drinks once a week.  Marland Kitchen  Drug Use: No  . Sexually Active: Yes   Other Topics Concern  . 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 Hypertension Mother   . Arthritis Mother   . Cancer Mother     breast cancer in her 83's and 60's (bilateral)  . Hypertension Father   . Other Father     died of pancreatitis  . Lichen planus Sister   . Celiac disease Maternal Uncle   . Cancer Maternal Grandmother     cervical and ovarian  . Cancer Maternal Grandfather     pancreatic    Current outpatient prescriptions:AMBULATORY NON FORMULARY MEDICATION, Apply 1 mL topically 2 (two) times daily.  BI-EST(6:4)+PROGEST+TEST 0.3MG  = 2% + 0.025MG  CREAM. 60 QUANTITY., Disp: 60 mL, Rfl: PRN;  latanoprost (XALATAN) 0.005 % ophthalmic solution, Place 1 drop into both eyes at bedtime.  , Disp: , Rfl: ;  LUTEIN PO, Take by mouth.  , Disp: , Rfl: ;  Multiple Minerals-Vitamins (CITRACAL PLUS) TABS, Take 1 tablet by mouth daily.  , Disp: , Rfl:  Multiple Vitamins-Minerals (CENTRUM SILVER PO), Take 1 tablet by mouth daily.  , Disp: , Rfl:   Allergies  Allergen Reactions  . Cortisone Other (See Comments)    Entire body hurt from a cortisone shot.  . Fosamax (Alendronate Sodium)     GI upset, bone pain; tolerated Actonel  . Tramadol Rash   Review of Systems Constitutional: -fever, -chills, -sweats, -unexpected -weight change,-fatigue ENT: -runny nose, -ear pain, -sore throat Cardiology:  -chest pain, -palpitations, -edema Respiratory: -cough, -shortness of breath, -wheezing Gastroenterology: -abdominal pain, -nausea, -vomiting, -diarrhea, -constipation  Hematology: -bleeding or bruising problems Urology: -dysuria, -difficulty urinating, -hematuria, -urinary frequency, -urgency Neurology: -headache, -weakness, -tingling, -numbness   Objective:  General Appearance:    Alert, cooperative, no distress, lean white female  Head:    Normocephalic, without obvious abnormality, atraumatic  Eyes:    PERRL, conjunctiva/corneas clear, EOM's intact  Ears:    Normal TM's and external ear canals  Nose:   Nares normal, mucosa normal, no drainage or sinus   tenderness  Throat:   Lips, mucosa, and tongue normal; teeth and gums normal  Neck:   Supple, no lymphadenopathy;  thyroid:  no   enlargement/tenderness/nodules; no carotid   bruit or JVD  Back:    Spine nontender, no curvature, ROM normal, no CVA     tenderness  Lungs:     Clear to auscultation bilaterally without wheezes, rales or     rhonchi; respirations unlabored  Chest Wall:    No tenderness or deformity   Heart:    Regular rate and rhythm, S1 and  S2 normal, no murmur, rub   or gallop.    Breast Exam:    No tenderness, masses, or nipple discharge or inversion.      No axillary lymphadenopathy  Abdomen:     Soft, non-tender, nondistended, normoactive bowel sounds,    no masses, no hepatosplenomegaly  Genitalia:    Normal external genitalia without lesions.  Mild atrophic changes. Bimanual exam performed (no speculum exam)-- Uterus and adnexa not enlarged, nontender, no masses.  Pap not performed  Rectal:    Normal tone, no masses or tenderness; guaiac negative stool  Extremities:   nontender joints and extremities, normal ROM, no obvious deformities or laxity, no clubbing, cyanosis or edema.  Pulses:   2+ and symmetric all extremities  Skin:   Skin color, texture, turgor normal.  scattered benign lesions.  Left medial breast with 2.33mm round cherry red lesion with irregular border but unchanged for years per pt, right upper abdomen with 4mm x 1mm raised oval stuck on appearing lesion c/w seborrheic keratosis  Lymph nodes:   Cervical, supraclavicular, and axillary nodes normal  Neurologic:   CNII-XII intact, normal strength, sensation and gait; reflexes 2+ and symmetric throughout          Psych:   Normal mood, affect, hygiene and grooming.     Adult ECG Report  Indication: palpitations  Rate: 51bpm  Rhythm: sinus bradycardia  QRS Axis: 51 degrees  PR Interval:  QRS Duration: 68ms  QTc:  Conduction Disturbances: none  Other Abnormalities: none  Patient's cardiac risk factors are: none.  EKG comparison: 09/2007    Narrative Interpretation: no significant change from 2009 EKG, sinus bradycardia   ASSESSMENT/PLAN: Encounter Diagnoses  Name Primary?  . Routine general medical examination at a health care facility Yes  . Need for prophylactic vaccination and inoculation against influenza   . Palpitation   . Screening for breast cancer   . Osteoporosis   . Vitamin D deficiency   . Adverse drug effect   .  Postmenopausal    Discussed diet, exercise, vaccinations, healthy lifestyle.  She is UTD on colonoscopy, repeat pap next year 2014.  Zostavax discussed and recommended--check insurance.  Continue yearly flu shots.  Flu vaccine, VIS and counseling given.  Palpitation - reassured.  Recheck if this continues  Referred for repeat yearly mammogram.  Hx/o multiple biopsies. Advised monthly self breast exams.  Osteoporosis, vit D deficiency:  Getting suboptimal amts of calcium and vitamin D, no different than a year ago.  Stressed the importance of 1500mg  Ca and 1000 Vit D daily.  Adverse effect of fosamax -  Repeat dexa.  Consider alternate therapy.    Postmenopausal - c/t compounded HRT.  discussed risks/benefits.   Refill sent.

## 2012-02-22 ENCOUNTER — Encounter: Payer: Self-pay | Admitting: Medical

## 2012-02-22 DIAGNOSIS — E559 Vitamin D deficiency, unspecified: Secondary | ICD-10-CM | POA: Insufficient documentation

## 2012-02-22 DIAGNOSIS — M810 Age-related osteoporosis without current pathological fracture: Secondary | ICD-10-CM

## 2012-02-22 HISTORY — DX: Age-related osteoporosis without current pathological fracture: M81.0

## 2012-02-22 HISTORY — DX: Vitamin D deficiency, unspecified: E55.9

## 2012-02-22 NOTE — Patient Instructions (Addendum)
Overall your significant health concerns include Glaucoma, Osteoporosis and Vitamin D deficiency.  Recommendations:  Given your history of osteoporosis and risk for fracture due to decreased bone density, I recommend you take 1500mg  Calcium with Vitamin D 1000 units daily.  This is available OTC  We will repeat a bone density scan.   Depending upon results, we can consider if we need to continue a bisphosphonate medication.  Options include once monthly Boniva, once yearly Reclast, or even a nasal spray.    We have referred you for repeat mammogram.  Do monthly self breast exams  Check your insurance coverage for Shingles vaccine/Zostavax coverage.  We recommend you get this.  We will repeat pap smear in 2014.  We will refill your compounded hormone cream.    After reviewing your past cholesterol lab evaluations, I would just recommend eating a healthy diet, avoiding lots of high cholesterol foods such as red meat, lots of cheese, ice cream, egg yolks, etc.  I do recommend you consider taking Aspirin 81mg  daily for heart health.  I don't have a copy of your prior echocardiogram.  Chart shows a history of mitral valve prolapse.  I did not hear a murmur.  At this point, since you have not had any more palpitations, you don't have any particular worrisome symptoms.  However, if you want to get a baseline echocardiogram now to recheck the heart valves, we can set this up.  Let me know.  Continue routine follow up with your ophthalmologist for Glaucoma management

## 2012-02-23 ENCOUNTER — Telehealth: Payer: Self-pay | Admitting: Medical

## 2012-02-23 NOTE — Telephone Encounter (Signed)
pls send the script refill form

## 2012-02-26 ENCOUNTER — Telehealth: Payer: Self-pay | Admitting: Medical

## 2012-02-26 DIAGNOSIS — Z780 Asymptomatic menopausal state: Secondary | ICD-10-CM

## 2012-02-26 MED ORDER — AMBULATORY NON FORMULARY MEDICATION: 1.0000 mL | Freq: Two times a day (BID) | Status: DC

## 2012-02-26 NOTE — Telephone Encounter (Signed)
REORDER FAXED TO [facility]

## 2012-04-02 ENCOUNTER — Other Ambulatory Visit: Payer: Self-pay | Admitting: Radiology

## 2012-04-02 LAB — HM MAMMOGRAPHY: HM Mammogram: ABNORMAL

## 2012-04-03 ENCOUNTER — Encounter: Payer: Self-pay | Admitting: Medical

## 2012-04-09 ENCOUNTER — Encounter: Payer: Self-pay | Admitting: Internal Medicine

## 2012-08-14 ENCOUNTER — Ambulatory Visit (INDEPENDENT_AMBULATORY_CARE_PROVIDER_SITE_OTHER): Payer: BC Managed Care – PPO | Admitting: Family Medicine

## 2012-08-14 ENCOUNTER — Encounter: Payer: Self-pay | Admitting: Family Medicine

## 2012-08-14 VITALS — BP 118/72 | HR 56 | Temp 98.0°F | Ht 67.0 in | Wt 140.0 lb

## 2012-08-14 DIAGNOSIS — R0789 Other chest pain: Secondary | ICD-10-CM

## 2012-08-14 DIAGNOSIS — R071 Chest pain on breathing: Secondary | ICD-10-CM

## 2012-08-14 NOTE — Patient Instructions (Addendum)
Consider trial of heat and/or NSAIDs (aleve or ibuprofen) when in more discomfort. Continue stretches. If worsening pain, shortness of breath, or other symptoms develop, return for re-evaluation (which may include x-rays or CT)
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
## 2012-08-20 ENCOUNTER — Other Ambulatory Visit (INDEPENDENT_AMBULATORY_CARE_PROVIDER_SITE_OTHER): Payer: BC Managed Care – PPO

## 2012-08-20 DIAGNOSIS — Z2911 Encounter for prophylactic immunotherapy for respiratory syncytial virus (RSV): Secondary | ICD-10-CM

## 2012-08-20 DIAGNOSIS — Z23 Encounter for immunization: Secondary | ICD-10-CM

## 2012-09-18 ENCOUNTER — Telehealth: Payer: Self-pay | Admitting: Internal Medicine

## 2012-09-18 NOTE — Telephone Encounter (Signed)
Refill request for BI-EST+ Progesterone + Testosterone 0.3mg  +2% +0.025mg  [facility]

## 2012-09-18 NOTE — Telephone Encounter (Signed)
Called in to pharmacy and scheduled patient for CPE with Dr.Knapp for 02/24/13.

## 2012-09-18 NOTE — Telephone Encounter (Signed)
Looks like it was done at CPE with Shane in 02/2012, with "prn" refills.  Okay to refill until due for CPE again in 02/2013. Likely should schedule CPE now also, if plans to have it with me

## 2013-02-24 ENCOUNTER — Other Ambulatory Visit (HOSPITAL_COMMUNITY)
Admission: RE | Admit: 2013-02-24 | Discharge: 2013-02-24 | Disposition: A | Discharge status: Home / Self Care | Payer: BC Managed Care – PPO | Source: Ambulatory Visit | Attending: Family Medicine | Admitting: Family Medicine

## 2013-02-24 ENCOUNTER — Ambulatory Visit (INDEPENDENT_AMBULATORY_CARE_PROVIDER_SITE_OTHER): Payer: BC Managed Care – PPO | Admitting: Family Medicine

## 2013-02-24 ENCOUNTER — Encounter: Payer: Self-pay | Admitting: Family Medicine

## 2013-02-24 VITALS — BP 124/70 | HR 56 | Ht 67.0 in | Wt 139.0 lb

## 2013-02-24 DIAGNOSIS — Z1151 Encounter for screening for human papillomavirus (HPV): Secondary | ICD-10-CM | POA: Insufficient documentation

## 2013-02-24 DIAGNOSIS — E559 Vitamin D deficiency, unspecified: Secondary | ICD-10-CM

## 2013-02-24 DIAGNOSIS — Z0000 Encounter for general adult medical examination without abnormal findings: Secondary | ICD-10-CM

## 2013-02-24 DIAGNOSIS — Z79899 Other long term (current) drug therapy: Secondary | ICD-10-CM

## 2013-02-24 DIAGNOSIS — Z13220 Encounter for screening for lipoid disorders: Secondary | ICD-10-CM

## 2013-02-24 DIAGNOSIS — Z01419 Encounter for gynecological examination (general) (routine) without abnormal findings: Secondary | ICD-10-CM | POA: Insufficient documentation

## 2013-02-24 DIAGNOSIS — Z23 Encounter for immunization: Secondary | ICD-10-CM

## 2013-02-24 DIAGNOSIS — M810 Age-related osteoporosis without current pathological fracture: Secondary | ICD-10-CM

## 2013-02-24 LAB — POCT URINALYSIS DIPSTICK
Bilirubin, UA: NEGATIVE
Ketones, UA: NEGATIVE
Leukocytes, UA: NEGATIVE
Protein, UA: NEGATIVE

## 2013-02-24 NOTE — Patient Instructions (Addendum)
HEALTH MAINTENANCE RECOMMENDATIONS:  It is recommended that you get at least 30 minutes of aerobic exercise at least 5 days/week (for weight loss, you may need as much as 60-90 minutes). This can be any activity that gets your heart rate up. This can be divided in 10-15 minute intervals if needed, but try and build up your endurance at least once a week.  Weight bearing exercise is also recommended twice weekly.  Eat a healthy diet with lots of vegetables, fruits and fiber.  "Colorful" foods have a lot of vitamins (ie green vegetables, tomatoes, red peppers, etc).  Limit sweet tea, regular sodas and alcoholic beverages, all of which has a lot of calories and sugar.  Up to 1 alcoholic drink daily may be beneficial for women (unless trying to lose weight, watch sugars).  Drink a lot of water.  Calcium recommendations are 1200-1500 mg daily (1500 mg for postmenopausal women or women without ovaries), and vitamin D 1000 IU daily.  This should be obtained from diet and/or supplements (vitamins), and calcium should not be taken all at once, but in divided doses.  Monthly self breast exams and yearly mammograms for women over the age of 82 is recommended.  Sunscreen of at least SPF 30 should be used on all sun-exposed parts of the skin when outside between the hours of 10 am and 4 pm (not just when at beach or pool, but even with exercise, golf, tennis, and yard work!)  Use a sunscreen that says "broad spectrum" so it covers both UVA and UVB rays, and make sure to reapply every 1-2 hours.  Remember to change the batteries in your smoke detectors when changing your clock times in the spring and fall.  Use your seat belt every time you are in a car, and please drive safely and not be distracted with cell phones and texting while driving.   Plantar Fasciitis Plantar fasciitis is a common condition that causes foot pain. It is soreness (inflammation) of the band of tough fibrous tissue on the bottom of the  foot that runs from the heel bone (calcaneus) to the ball of the foot. The cause of this soreness may be from excessive standing, poor fitting shoes, running on hard surfaces, being overweight, having an abnormal walk, or overuse (this is common in runners) of the painful foot or feet. It is also common in aerobic exercise dancers and ballet dancers. SYMPTOMS  Most people with plantar fasciitis complain of:  Severe pain in the morning on the bottom of their foot especially when taking the first steps out of bed. This pain recedes after a few minutes of walking.  Severe pain is experienced also during walking following a long period of inactivity.  Pain is worse when walking barefoot or up stairs DIAGNOSIS   Your caregiver will diagnose this condition by examining and feeling your foot.  Special tests such as X-rays of your foot, are usually not needed. PREVENTION   Consult a sports medicine professional before beginning a new exercise program.  Walking programs offer a good workout. With walking there is a lower chance of overuse injuries common to runners. There is less impact and less jarring of the joints.  Begin all new exercise programs slowly. If problems or pain develop, decrease the amount of time or distance until you are at a comfortable level.  Wear good shoes and replace them regularly.  Stretch your foot and the heel cords at the back of the ankle (Achilles tendon) both  before and after exercise.  Run or exercise on even surfaces that are not hard. For example, asphalt is better than pavement.  Do not run barefoot on hard surfaces.  If using a treadmill, vary the incline.  Do not continue to workout if you have foot or joint problems. Seek professional help if they do not improve. HOME CARE INSTRUCTIONS   Avoid activities that cause you pain until you recover.  Use ice or cold packs on the problem or painful areas after working out.  Only take over-the-counter or  prescription medicines for pain, discomfort, or fever as directed by your caregiver.  Soft shoe inserts or athletic shoes with air or gel sole cushions may be helpful.  If problems continue or become more severe, consult a sports medicine caregiver or your own health care provider. Cortisone is a potent anti-inflammatory medication that may be injected into the painful area. You can discuss this treatment with your caregiver. MAKE SURE YOU:   Understand these instructions.  Will watch your condition.  Will get help right away if you are not doing well or get worse. Document Released: 11/29/2000 Document Revised: 05/29/2011 Document Reviewed: 01/29/2008 [facility] Patient Information 2014 [place], [place].

## 2013-02-24 NOTE — Progress Notes (Signed)
Chief Complaint  Patient presents with  . Annual Exam    fasting annual exam with pap. Did not do eye exam, she just had one with Paige Klein last month. UA showed 1+, no symptoms. Paige Klein recommended a low dose asa last year, she stopped taking because it was bothering her stomach. B/L foot pain, right still bothering her.    Paige Klein is a 62 y.o. female who presents for a complete physical.  She has the following concerns:  Some right heel pain, mainly when she first wakes up in the morning.  Osteoporosis:  She has been off the fosamax for over a year. She took Actonel x 6 years, subsequently changed to Fosamax (insurance reasons). She thinks she had been on medications for about 10 years. (For many years she took med with well water, and it was felt it wasn't being absorbed, bone density continued to decline. Stabilized since changing to taking it with distilled water). She stopped Fosamax in September 2013 after reading about possible adverse effects. She says since stopping Fosamax, has no more GI upset, bone pains/aches are better, and left hip pain has completely resolved.  (She also quit working, and no longer has stairs at home).  Postmenopausal with atrophic vaginitis, uses compounded progesterone (bi-est (6:4)+progest+test 0.3mg +2%+0.025mg  1cc topically twice daily.  She is not in a sexual relationship, although has a boyfriend.  She knows that intercourse would be extremely painful for her.  Immunization History  Administered Date(s) Administered  . Influenza Split 03/21/2007, 03/20/2008, 01/19/2011  . Influenza, Seasonal, Injecte, Preservative Fre 02/21/2012  . PPD Test 02/11/1997  . Td 10/04/1990, 03/18/2001  . Tdap 04/13/2009  . Zoster 08/20/2012   Last Pap smear: 03/2009 Last mammogram: 09/2012 Last colonoscopy: 07/2011 Last DEXA: 03/2012:  T-2.9 in spine, but unchanged when compared to scan in 2011 Ophtho: every 6 months (glaucoma); went last month Dentist: regularly,  every 6 months Exercise: yoga once a week during school year, gym 2x/week (but hasn't been very good about going regularly)  Past Medical History  Diagnosis Date  . Unspecified vitamin D deficiency 2009  . Glaucoma suspect of both eyes     high-normal intraocular pressures (Paige Klein)  . Abnormal Pap smear of cervix 1999    ASGUS x 1  . Kidney stone 1986    right  . Osteoporosis   . Allergic rhinitis, cause unspecified     mold; and vasomotor rhinitis  . Migraine headache     resolved with menopause (menstrual migraines)  . MVP (mitral valve prolapse)     per echo  . Fibrocystic breast changes     and also breast calcifications (08/2008)  . Postmenopausal     on compounded cream daily  . Cataract   . Internal hemorrhoids   . H/O mammogram 11/23/10    normal  . Pap smear for cervical cancer screening 04/14/09    negative for malignancy, +atrophic changes  . H/O bone density study 09/07/09    08/16/07, 08/08/05; osteoporosis  . Hypercholesteremia     borderline per NMR lipoprofile 2008, 2007    Past Surgical History  Procedure Laterality Date  . Cervical polypectomy  2003    treated with cryosurgery  . Colonoscopy  08/02/11; 05/2000    Paige Klein - rectal polyps 2013  . Breast cyst aspiration      many (Paige Klein)  . Endocervical cyst  12/96, 6/97    Paige Klein  . Cardiolite (stress test)  11/05  Paige Klein  . Cataract surgery  2010    bilateral  . Breast fibroadenoma surgery  09-03    PaigeGooden  . Dilation and curettage of uterus  05/2009    uterine polyp, bleeding    History   Social History  . Marital Status: Divorced    Spouse Name: N/A    Number of Children: 0  . Years of Education: N/A   Occupational History  . 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 Smoking status: Never Smoker   . Smokeless tobacco: Never Used  . Alcohol Use: Yes     Comment: 2-3 drinks once a week.  . Drug Use: No  . Sexual  Activity: Yes     Comment: has boyfriend, not currently sexually active   Other Topics Concern  . Not on file   Social History Narrative   Divorced, no children, exercise with yoga, goes to the [employer], school psychologist - retired.   Lives alone    Family History  Problem Relation Age of Onset  . Hypertension Mother   . Arthritis Mother   . Cancer Mother     breast cancer in her 18's and 75's (bilateral)  . Hypertension Father   . Other Father     died of pancreatitis  . Lichen planus Sister   . Celiac disease Maternal Uncle   . Cancer Maternal Grandmother     cervical and ovarian  . Cancer Maternal Grandfather     pancreatic  . Muscular dystrophy Maternal Aunt     adult onset    Current outpatient prescriptions:AMBULATORY NON FORMULARY MEDICATION, Apply 1 mL topically 2 (two) times daily. BI-EST(6:4)+PROGEST+TEST 0.3MG  = 2% + 0.025MG  CREAM. 60 QUANTITY., Disp: 60 mL, Rfl: PRN;  latanoprost (XALATAN) 0.005 % ophthalmic solution, Place 1 drop into both eyes at bedtime.  , Disp: , Rfl: ;  LUTEIN PO, Take by mouth.  , Disp: , Rfl: ;  Multiple Minerals-Vitamins (CITRACAL PLUS) TABS, Take 1 tablet by mouth daily.  , Disp: , Rfl:  Multiple Vitamins-Minerals (CENTRUM SILVER PO), Take 1 tablet by mouth daily.  , Disp: , Rfl:   Allergies  Allergen Reactions  . Cortisone Other (See Comments)    Entire body hurt from a cortisone shot.  . Fosamax [Alendronate Sodium]     GI upset, bone pain; tolerated Actonel  . Tramadol Rash    ROS: The patient denies anorexia, fever, weight changes, headaches, vision changes (slight visual field loss from glaucoma, on left field of vision), decreased hearing, ear pain, sore throat, breast concerns, chest pain, palpitations, dizziness, syncope, dyspnea on exertion, cough, swelling, nausea, vomiting, diarrhea, constipation, abdominal pain, melena, hematochezia, indigestion/heartburn, hematuria, Dysuria, postmenopausal bleeding, vaginal discharge, odor or  itch, genital lesions, numbness, tingling, weakness, tremor, suspicious skin lesions, depression, anxiety, abnormal bleeding/bruising, or enlarged lymph nodes. Rare allergy symptoms in her eyes (has meds from Paige Klein for prn use).  Some urinary urgency, no incontinence.  PHYSICAL EXAM:  BP 124/70  Pulse 56  Ht 5\' 7"  (1.702 m)  Wt 139 lb (63.05 kg)  BMI 21.77 kg/m2  LMP 03/20/2000   General Appearance:  Alert, cooperative, no distress, appears her age  Head:  Normocephalic, without obvious abnormality, atraumatic   Eyes:  PERRL, conjunctiva/corneas clear, EOM's intact   Ears:  Normal TM's and external ear canals   Nose:  Nares normal, mucosa normal, no drainage or sinus tenderness.  Small amount of clear mucus in nares  Throat:  Lips, mucosa, and tongue normal; teeth and gums normal   Neck:  Supple, no lymphadenopathy; thyroid: no enlargement/tenderness/nodules; no carotid  bruit or JVD   Back:  Spine nontender, no curvature, ROM normal, no CVA tenderness   Lungs:  Clear to auscultation bilaterally without wheezes, rales or rhonchi; respirations unlabored   Chest Wall:  No tenderness or deformity   Heart:  Regular rate and rhythm, S1 and S2 normal, no murmur, rub  or gallop.   Breast Exam:  No tenderness, masses, or nipple discharge or inversion. No axillary lymphadenopathy   Abdomen:  Soft, non-tender, nondistended, normoactive bowel sounds,  no masses, no hepatosplenomegaly   Genitalia:  Normal external genitalia without lesions. Mild atrophic changes. Had pain upon insertion of speculum, narrow introitus Cervix is normal, no lesions, no cervical motion tenderness. Uterus and adnexa not enlarged, nontender, no masses. Pap performed   Rectal:  Normal tone, no masses or tenderness; guaiac negative stool   Extremities:  nontender joints and extremities, normal ROM, no clubbing, cyanosis or edema. nontender at plantar fascia insertion at calcaneous   Pulses:  2+ and symmetric all  extremities   Skin:  Skin color, texture, turgor normal. scattered benign lesions. Left medial breast with 2.68mm round cherry red lesion with irregular border but unchanged for years per pt, right upper abdomen with 4mm x 1mm raised oval stuck on appearing lesion c/w seborrheic keratosis.  Upper central chest has a 10x2mm flesh-colored raised area.  Smooth, no telangiectasia.  Unchanged per pt, without any bleeding/scabbing.  Lymph nodes:  Cervical, supraclavicular, and axillary nodes normal   Neurologic:  CNII-XII intact, normal strength, sensation and gait; reflexes 2+ and symmetric throughout          Psych: Normal mood, affect, hygiene and grooming.     ASSESSMENT/PLAN:  Routine general medical examination at a health care facility - Plan: POCT Urinalysis Dipstick, Comprehensive metabolic panel, Lipid panel, TSH, Cytology - PAP [facility]  Need for prophylactic vaccination and inoculation against influenza - Plan: Flu Vaccine QUAD 36+ mos IM  Osteoporosis, unspecified - Plan: Vit D  25 hydroxy (rtn osteoporosis monitoring), TSH  Unspecified vitamin D deficiency - Plan: Vit D  25 hydroxy (rtn osteoporosis monitoring)  Encounter for long-term (current) use of other medications - Plan: Comprehensive metabolic panel, Vit D  25 hydroxy (rtn osteoporosis monitoring)  Screening for lipoid disorders - Plan: Lipid panel  Osteoporosis  Suspect mild plantar fasciitis. Discussed arch supports, stretches.  NSAIDs prn.  Discussed monthly self breast exams and yearly mammograms after the age of 68; at least 30 minutes of aerobic activity at least 5 days/week; proper sunscreen use reviewed; healthy diet, including goals of calcium and vitamin D intake and alcohol recommendations (less than or equal to 1 drink/day) reviewed; regular seatbelt use; changing batteries in smoke detectors.  Immunization recommendations discussed-pneumonia vaccine at age 22.  Colonoscopy recommendations  reviewed--UTD  Osteoporosis and vitamin D deficiency: continue calcium and vitamin D as recommended.  dexa due 03/2014 Postmenopausal - c/t compounded HRT. discussed risks/benefits. Await refill request from pharmacy, when needed

## 2013-02-25 LAB — COMPREHENSIVE METABOLIC PANEL
ALT: 14 U/L (ref 0–35)
CO2: 27 mEq/L (ref 19–32)
Sodium: 139 mEq/L (ref 135–145)
Total Bilirubin: 0.4 mg/dL (ref 0.3–1.2)
Total Protein: 7.3 g/dL (ref 6.0–8.3)

## 2013-02-25 LAB — LIPID PANEL
Cholesterol: 228 mg/dL — ABNORMAL HIGH (ref 0–200)
LDL Cholesterol: 136 mg/dL — ABNORMAL HIGH (ref 0–99)
VLDL: 22 mg/dL (ref 0–40)

## 2013-02-25 LAB — VITAMIN D 25 HYDROXY (VIT D DEFICIENCY, FRACTURES): Vit D, 25-Hydroxy: 40 ng/mL (ref 30–89)

## 2013-02-25 LAB — TSH: TSH: 1.525 u[IU]/mL (ref 0.350–4.500)

## 2013-02-26 ENCOUNTER — Encounter: Payer: Self-pay | Admitting: Family Medicine

## 2013-04-16 ENCOUNTER — Telehealth: Payer: Self-pay | Admitting: *Deleted

## 2013-04-16 ENCOUNTER — Other Ambulatory Visit: Payer: Self-pay | Admitting: *Deleted

## 2013-04-16 ENCOUNTER — Telehealth: Payer: Self-pay | Admitting: Internal Medicine

## 2013-04-16 NOTE — Telephone Encounter (Signed)
Refill request for Bi-est +prgesterone + testosterone +2%+0.025mg  cream to [facility]

## 2013-04-16 NOTE — Telephone Encounter (Signed)
She is due for a 6 month f/u mammogram.  Please ensure that she has either had this (does at [facility]) or has it scheduled.  If only rescheduled, refill x 1 month.  If had mammo, okay to refill x 1 year if mammo is normal.

## 2013-04-16 NOTE — Telephone Encounter (Signed)
Spoke with patient and she will call [facility] and schedule 6 month mammogram, went ahead and filled 1 month worth of her bi-est cream. Once she has mammo and we have results I can go ahead and refill for 1 year.

## 2013-05-09 LAB — HM MAMMOGRAPHY

## 2013-05-16 ENCOUNTER — Encounter: Payer: Self-pay | Admitting: Internal Medicine

## 2013-05-16 LAB — HM MAMMOGRAPHY

## 2013-06-30 ENCOUNTER — Other Ambulatory Visit: Payer: Self-pay | Admitting: *Deleted

## 2013-06-30 ENCOUNTER — Telehealth: Payer: Self-pay | Admitting: Internal Medicine

## 2013-06-30 DIAGNOSIS — Z780 Asymptomatic menopausal state: Secondary | ICD-10-CM

## 2013-06-30 MED ORDER — AMBULATORY NON FORMULARY MEDICATION: 1.0000 mL | Freq: Two times a day (BID) | Status: DC

## 2013-06-30 NOTE — Telephone Encounter (Signed)
Called in x 73mo only, as it is a controlled substance.

## 2013-06-30 NOTE — Telephone Encounter (Signed)
Refill request for Bi-Est +progesterone + testosterone 0.3mg  +2%+0.025mg  cream to [facility]

## 2013-06-30 NOTE — Telephone Encounter (Signed)
Ok to refill to last until when next visit in December

## 2014-02-25 ENCOUNTER — Ambulatory Visit (INDEPENDENT_AMBULATORY_CARE_PROVIDER_SITE_OTHER): Payer: BC Managed Care – PPO | Admitting: Family Medicine

## 2014-02-25 ENCOUNTER — Encounter: Payer: Self-pay | Admitting: Family Medicine

## 2014-02-25 VITALS — BP 128/74 | Ht 67.0 in | Wt 138.0 lb

## 2014-02-25 DIAGNOSIS — M810 Age-related osteoporosis without current pathological fracture: Secondary | ICD-10-CM

## 2014-02-25 DIAGNOSIS — E559 Vitamin D deficiency, unspecified: Secondary | ICD-10-CM

## 2014-02-25 DIAGNOSIS — R001 Bradycardia, unspecified: Secondary | ICD-10-CM

## 2014-02-25 DIAGNOSIS — Z0000 Encounter for general adult medical examination without abnormal findings: Secondary | ICD-10-CM

## 2014-02-25 DIAGNOSIS — Z23 Encounter for immunization: Secondary | ICD-10-CM

## 2014-02-25 DIAGNOSIS — R5383 Other fatigue: Secondary | ICD-10-CM

## 2014-02-25 HISTORY — DX: Bradycardia, unspecified: R00.1

## 2014-02-25 LAB — CBC WITH DIFFERENTIAL/PLATELET
BASOS ABS: 0 10*3/uL (ref 0.0–0.1)
Basophils Relative: 1 % (ref 0–1)
Eosinophils Absolute: 0.1 10*3/uL (ref 0.0–0.7)
Eosinophils Relative: 2 % (ref 0–5)
HCT: 38.7 % (ref 36.0–46.0)
HEMOGLOBIN: 12.7 g/dL (ref 12.0–15.0)
LYMPHS ABS: 1.4 10*3/uL (ref 0.7–4.0)
LYMPHS PCT: 28 % (ref 12–46)
MCH: 30.5 pg (ref 26.0–34.0)
MCHC: 32.8 g/dL (ref 30.0–36.0)
MCV: 92.8 fL (ref 78.0–100.0)
MONO ABS: 0.6 10*3/uL (ref 0.1–1.0)
MPV: 9.1 fL — ABNORMAL LOW (ref 9.4–12.4)
Monocytes Relative: 12 % (ref 3–12)
NEUTROS ABS: 2.8 10*3/uL (ref 1.7–7.7)
Neutrophils Relative %: 57 % (ref 43–77)
PLATELETS: 284 10*3/uL (ref 150–400)
RBC: 4.17 MIL/uL (ref 3.87–5.11)
RDW: 13.4 % (ref 11.5–15.5)
WBC: 4.9 10*3/uL (ref 4.0–10.5)

## 2014-02-25 LAB — COMPREHENSIVE METABOLIC PANEL
ALT: 14 U/L (ref 0–35)
AST: 18 U/L (ref 0–37)
Albumin: 4.1 g/dL (ref 3.5–5.2)
Alkaline Phosphatase: 53 U/L (ref 39–117)
BILIRUBIN TOTAL: 0.5 mg/dL (ref 0.2–1.2)
BUN: 25 mg/dL — AB (ref 6–23)
CO2: 25 meq/L (ref 19–32)
CREATININE: 0.98 mg/dL (ref 0.50–1.10)
Calcium: 9.4 mg/dL (ref 8.4–10.5)
Chloride: 107 mEq/L (ref 96–112)
GLUCOSE: 77 mg/dL (ref 70–99)
Potassium: 4.3 mEq/L (ref 3.5–5.3)
SODIUM: 140 meq/L (ref 135–145)
TOTAL PROTEIN: 6.8 g/dL (ref 6.0–8.3)

## 2014-02-25 LAB — POCT URINALYSIS DIPSTICK
BILIRUBIN UA: NEGATIVE
Blood, UA: NEGATIVE
GLUCOSE UA: NEGATIVE
KETONES UA: NEGATIVE
LEUKOCYTES UA: NEGATIVE
NITRITE UA: NEGATIVE
Protein, UA: NEGATIVE
Spec Grav, UA: 1.02
Urobilinogen, UA: NEGATIVE
pH, UA: 7

## 2014-02-25 LAB — TSH: TSH: 2.235 u[IU]/mL (ref 0.350–4.500)

## 2014-02-25 LAB — LIPID PANEL
CHOL/HDL RATIO: 3.4 ratio
CHOLESTEROL: 185 mg/dL (ref 0–200)
HDL: 55 mg/dL (ref >39)
LDL Cholesterol: 115 mg/dL — ABNORMAL HIGH (ref 0–99)
TRIGLYCERIDES: 76 mg/dL (ref <150)
VLDL: 15 mg/dL (ref 0–40)

## 2014-02-25 NOTE — Progress Notes (Signed)
Chief Complaint  Patient presents with  . Annual Exam    nonfasting annual exam (blood in lab) with pelvic exam, pap done last year. Did not do eye exam as she just had one [facility] She is using new eye drop and she wonders if that is having an affect on her heart rate.    Paige Klein is a 63 y.o. female who presents for a complete physical.  She has the following concerns:  Last year she had heel pain, felt to have mild plantar fasciitis.  She got better shoes, and heel pain resolved.  She injured her left inner calf back in May.  She had a large knot, which resolved, but discoloration persists.  Her ophtho started her on a new eye drop a month ago for glaucoma.  She read that it could lower her heart rate.  It was 45-48 when she checked at home, and at the dentist (48 at dentist was prior to the new eye drop).  She does feel a little short of breath with exercise.  Denies dizziness, chest pain, shortness of breath on a regular basis.  She has been a little more tired than usual, but attributes that to stress and change in schedule/routine.  She plans to discuss with her ophtho her concerns, and wanted my opinion.  Osteoporosis: She has been off the fosamax since 11/2011 (since reading about adverse effects). She took Actonel x 6 years, subsequently changed to Fosamax (insurance reasons). She thinks she had been on medications for about 10 years. (For many years she took med with well water, and it was felt it wasn't being absorbed, bone density continued to decline. Stabilized since changing to taking it with distilled water). She says since stopping Fosamax, has no more GI upset, bone pains/aches are better, and left hip pain has completely resolved. (She also quit working, and no longer has stairs at home). She noticed stiffness in her yoga class, even after not working, while still taking the fosamax.  [facility].  Postmenopausal with atrophic vaginitis, uses compounded progesterone (bi-est (6:4)+progest+test 0.3mg +2%+0.025mg  1cc topically--she admits to only taking it once daily (rather than the twice daily), even less recently as she has run out. She is not in a sexual relationship, although has a boyfriend.When she has stopped the cream in the past, she had urinary symptoms (burning). Denies hot flashes or night sweats.  Sometimes feels a little sluggish when she is out of the cream.  Immunization History  Administered Date(s) Administered  . Influenza Split 03/21/2007, 03/20/2008, 01/19/2011  . Influenza, Seasonal, Injecte, Preservative Fre 02/21/2012  . Influenza,inj,Quad PF,36+ Mos 02/24/2013, 02/25/2014  . PPD Test 02/11/1997  . Td 10/04/1990, 03/18/2001  . Tdap 04/13/2009  . Zoster 08/20/2012   Last Pap smear: 02/2013--normal with no high risk HPV Last mammogram: 04/2013 Last colonoscopy: 07/2011 Last DEXA: 03/2012: T-2.9 in spine, but unchanged when compared to scan in 2011 Ophtho: every 6 months (glaucoma); has follow-up tomorrow Dentist: regularly, every 6 months Exercise: yoga once a week, goes to the gym once a week (treadmill and weights).  Past Medical History  Diagnosis Date  . Unspecified vitamin D deficiency 2009  . Glaucoma suspect of both eyes     high-normal intraocular pressures (Dr. Emily [facility])  . Abnormal Pap smear of cervix 1999    ASGUS x 1  . Kidney stone 1986    right  . Osteoporosis   .  Allergic rhinitis, cause unspecified     mold; and vasomotor rhinitis  . Migraine headache     resolved with menopause (menstrual migraines)  . MVP (mitral valve prolapse)     per echo  . Fibrocystic breast changes     and also breast calcifications (08/2008)  . Postmenopausal     on compounded cream daily  . Cataract   . Internal hemorrhoids   . H/O mammogram 11/23/10    normal  . Pap smear for cervical cancer screening 04/14/09    negative for malignancy, +atrophic  changes  . H/O bone density study 09/07/09    08/16/07, 08/08/05; osteoporosis  . Hypercholesteremia     borderline per NMR lipoprofile 2008, 2007  . Glaucoma     Past Surgical History  Procedure Laterality Date  . Cervical polypectomy  2003    treated with cryosurgery  . Colonoscopy  08/02/11; 05/2000    Dr. Loreta AveMann - rectal polyps 2013  . Breast cyst aspiration      many (Dr. Maryagnes AmosLeone)  . Endocervical cyst  12/96, 6/97    Dr Chevis PrettyMezer  . Cardiolite (stress test)  11/05    Dr. Ermalinda MemosBradshaw  . Cataract surgery  2010    bilateral  . Breast fibroadenoma surgery  09-03    Dr.Gooden  . Dilation and curettage of uterus  05/2009    uterine polyp, bleeding    History   Social History  . Marital Status: Divorced    Spouse Name: N/A    Number of Children: 0  . Years of Education: N/A   Occupational History  . 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 Smoking status: Never Smoker   . Smokeless tobacco: Never Used  . Alcohol Use: 0.0 oz/week    0 Not specified per week     Comment: 2-3 drinks once a week.  . Drug Use: No  . Sexual Activity: Not Currently     Comment: has boyfriend, not currently sexually active   Other Topics Concern  . 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   Lives alone    Family History  Problem Relation Age of Onset  . Hypertension Mother   . Arthritis Mother   . Cancer Mother     breast cancer in her 5970's and 8080's (bilateral)  . Hypertension Father   . Other Father     died of pancreatitis  . Lichen planus Sister   . Celiac disease Maternal Uncle   . Cancer Maternal Grandmother     cervical and ovarian  . Cancer Maternal Grandfather     pancreatic  . Muscular dystrophy Maternal Aunt     adult onset    Outpatient Encounter Prescriptions as of 02/25/2014  Medication Sig Note  . AMBULATORY NON FORMULARY  MEDICATION Apply 1 mL topically 2 (two) times daily. BI-EST(6:4)+PROGEST+TEST 0.3MG  = 2% + 0.025MG  CREAM. 60 QUANTITY. 02/25/2014: Has only been using it once daily, previously rx'd for BID.  Recently ran out  . dorzolamide-timolol (COSOPT) 22.3-6.8 MG/ML ophthalmic solution Place 1 drop into both eyes 2 (two) times daily.   Marland Kitchen. latanoprost (XALATAN) 0.005 % ophthalmic solution Place 1 drop into both eyes at bedtime.     . LUTEIN PO Take by mouth.     . Multiple Minerals-Vitamins (CITRACAL PLUS) TABS Take 1 tablet by mouth daily.     .Marland Kitchen  Multiple Vitamins-Minerals (CENTRUM SILVER PO) Take 1 tablet by mouth daily.       Allergies  Allergen Reactions  . Cortisone Other (See Comments)    Entire body hurt from a cortisone shot.  . Fosamax [Alendronate Sodium]     GI upset, bone pain; tolerated Actonel  . Tramadol Rash   ROS: The patient denies anorexia, fever, weight changes, headaches, vision changes (slight visual field loss from glaucoma, on left field of vision, worsening slightly), decreased hearing, ear pain, sore throat, breast concerns, chest pain, palpitations, dizziness, syncope, dyspnea on exertion (slight--see HPI, since heart rate lower), cough, swelling, nausea, vomiting, diarrhea, constipation, abdominal pain, melena, hematochezia, indigestion/heartburn, hematuria, Dysuria, postmenopausal bleeding, vaginal discharge, odor or itch, genital lesions, numbness, tingling, weakness, tremor, suspicious skin lesions, depression, anxiety, abnormal bleeding/bruising, or enlarged lymph nodes. Some urinary urgency, only rare leakage (on the way to the bathroom). Occasional insomnia  PHYSICAL EXAM:  BP 128/74 mmHg  Ht 5\' 7"  (1.702 m)  Wt 138 lb (62.596 kg)  BMI 21.61 kg/m2  LMP 03/20/2000 Pulse 46 and regular.  General Appearance:  Alert, cooperative, no distress, appears her age  Head:  Normocephalic, without obvious abnormality, atraumatic   Eyes:  PERRL, conjunctiva/corneas clear,  EOM's intact; fundi without hemorrhage or exudate  Ears:  Normal TM's and external ear canals   Nose:  Nares normal, mucosa normal, no drainage or sinus tenderness. Small amount of clear mucus in nares  Throat:  Lips, mucosa, and tongue normal; teeth and gums normal   Neck:  Supple, no lymphadenopathy; thyroid: no enlargement/tenderness/nodules; no carotid  bruit or JVD   Back:  Spine nontender, no curvature, ROM normal, no CVA tenderness   Lungs:  Clear to auscultation bilaterally without wheezes, rales or rhonchi; respirations unlabored   Chest Wall:  No tenderness or deformity   Heart:  Regular rate and rhythm, S1 and S2 normal, no murmur, rub  or gallop.   Breast Exam:  No tenderness, masses, or nipple discharge or inversion. No axillary lymphadenopathy   Abdomen:  Soft, non-tender, nondistended, normoactive bowel sounds,  no masses, no hepatosplenomegaly   Genitalia:  Normal external genitalia without lesions. Mild atrophic changes. No cervical motion tenderness. Uterus and adnexa not enlarged, nontender, no masses. Pap not performed   Rectal:  Normal tone, no masses or tenderness; guaiac negative stool   Extremities:  nontender joints and extremities, normal ROM, no clubbing, cyanosis or edema. nontender at plantar fascia insertion at calcaneous   Pulses:  2+ and symmetric all extremities   Skin:  Skin color, texture, turgor normal. scattered benign lesions. Left medial breast with 2.16mm round cherry red lesion with irregular border but unchanged for years per pt, right upper abdomen with 4mm x 1mm raised oval stuck on appearing lesion c/w seborrheic keratosis. Upper central chest has a 10x31mm flesh-colored raised area. Smooth, no telangiectasia. Unchanged per pt, without any bleeding/scabbing.some hyperpigmentation at her medial lower calf on left.  Nontender. Just above this is a round lesion she states has been present x years, unchanged.  Lymph  nodes:  Cervical, supraclavicular, and axillary nodes normal   Neurologic:  CNII-XII intact, normal strength, sensation and gait; reflexes 2+ and symmetric throughout    Psych: Normal mood, affect, hygiene and grooming.    ASSESSMENT/PLAN:  Annual physical exam - Plan: POCT Urinalysis Dipstick, Lipid panel, Comprehensive metabolic panel, CBC with Differential, TSH  Need for prophylactic vaccination and inoculation against influenza - Plan: Flu Vaccine QUAD 36+ mos PF IM (Fluarix  Quad PF)  Vitamin D deficiency  Osteoporosis - DEXA due in January.  reviewed calcium, vit D and weight-bearing exercise recommendations - Plan: Vit D  25 hydroxy (rtn osteoporosis monitoring), TSH  Other fatigue - Plan: Comprehensive metabolic panel, CBC with Differential, Vit D  25 hydroxy (rtn osteoporosis monitoring), TSH  Bradycardia - slightly symptomatic, with exercise only. Sounds like HR was same prior to eye drops.  Declined referral to cardiology.  reviewed symptoms.   Discussed monthly self breast exams and yearly mammograms; at least 30 minutes of aerobic activity at least 5 days/week, weight-bearing exercise 2x/week; proper sunscreen use reviewed; healthy diet, including goals of calcium and vitamin D intake and alcohol recommendations (less than or equal to 1 drink/day) reviewed; regular seatbelt use; changing batteries in smoke detectors. Immunization recommendations discussed-pneumonia vaccine at age 38 (prevnar-13, f/b pneumovax).  Flu shot given today. Colonoscopy recommendations reviewed--UTD  Bradycardia--low pulse noted at dentist before medication, but shortness of breath after eye drop started. If symptoms do not resolve after changing med (she will discuss with ophtho), then refer to cardiology for further eval. Discussed symptoms, treatments (ie pacemaker).  You had mild bradycardia in the past; pulse is slower now that you are on an eyedrop with a beta  blocker.  You are having mild symptoms.  If the beta blocker can be stopped, and the pulse rises and you feel better, great!.  If the pulse remains <50 and you continue to feel short of breath with exertion, let me know and I will refer you to the cardiology for evaluation.  Osteoporosis and vitamin D deficiency: continue calcium and vitamin D as recommended. dexa due 03/2014 Postmenopausal - c/t compounded HRT. discussed risks/benefits. Await refill request from pharmacy, when needed

## 2014-02-25 NOTE — Patient Instructions (Signed)
HEALTH MAINTENANCE RECOMMENDATIONS:  It is recommended that you get at least 30 minutes of aerobic exercise at least 5 days/week (for weight loss, you may need as much as 60-90 minutes). This can be any activity that gets your heart rate up. This can be divided in 10-15 minute intervals if needed, but try and build up your endurance at least once a week.  Weight bearing exercise is also recommended twice weekly.  Eat a healthy diet with lots of vegetables, fruits and fiber.  "Colorful" foods have a lot of vitamins (ie green vegetables, tomatoes, red peppers, etc).  Limit sweet tea, regular sodas and alcoholic beverages, all of which has a lot of calories and sugar.  Up to 1 alcoholic drink daily may be beneficial for women (unless trying to lose weight, watch sugars).  Drink a lot of water.  Calcium recommendations are 1200-1500 mg daily (1500 mg for postmenopausal women or women without ovaries), and vitamin D 1000 IU daily.  This should be obtained from diet and/or supplements (vitamins), and calcium should not be taken all at once, but in divided doses.  Monthly self breast exams and yearly mammograms for women over the age of 64 is recommended.  Sunscreen of at least SPF 30 should be used on all sun-exposed parts of the skin when outside between the hours of 10 am and 4 pm (not just when at beach or pool, but even with exercise, golf, tennis, and yard work!)  Use a sunscreen that says "broad spectrum" so it covers both UVA and UVB rays, and make sure to reapply every 1-2 hours.  Remember to change the batteries in your smoke detectors when changing your clock times in the spring and fall.  Use your seat belt every time you are in a car, and please drive safely and not be distracted with cell phones and texting while driving.  Bradycardia (slow heart rate) You had mild bradycardia in the past; pulse is slower now that you are on an eyedrop with a beta blocker.  You are having mild symptoms.  If  the beta blocker can be stopped, and the pulse rises and you feel better, great!.  If the pulse remains <50 and you continue to feel short of breath with exertion, let me know and I will refer you to the cardiology for evaluation.  Bradycardia Bradycardia is a term for a heart rate (pulse) that, in adults, is slower than 60 beats per minute. A normal rate is 60 to 100 beats per minute. A heart rate below 60 beats per minute may be normal for some adults with healthy hearts. If the rate is too slow, the heart may have trouble pumping the volume of blood the body needs. If the heart rate gets too low, blood flow to the brain may be decreased and may make you feel lightheaded, dizzy, or faint. The heart has a natural pacemaker in the top of the heart called the SA node (sinoatrial or sinus node). This pacemaker sends out regular electrical signals to the muscle of the heart, telling the heart muscle when to beat (contract). The electrical signal travels from the upper parts of the heart (atria) through the AV node (atrioventricular node), to the lower chambers of the heart (ventricles). The ventricles squeeze, pumping the blood from your heart to your lungs and to the rest of your body. CAUSES   Problem with the heart's electrical system.  Problem with the heart's natural pacemaker.  Heart disease, damage, or infection.  Medications.  Problems with minerals and salts (electrolytes). SYMPTOMS   Fainting (syncope).  Fatigue and weakness.  Shortness of breath (dyspnea).  Chest pain (angina).  Drowsiness.  Confusion. DIAGNOSIS   An electrocardiogram (ECG) can help your caregiver determine the type of slow heart rate you have.  If the cause is not seen on an ECG, you may need to wear a heart monitor that records your heart rhythm for several hours or days.  Blood tests. TREATMENT   Electrolyte supplements.  Medications.  Withholding medication which is causing a slow heart  rate.  Pacemaker placement. SEEK IMMEDIATE MEDICAL CARE IF:   You feel lightheaded or faint.  You develop an irregular heart rate.  You feel chest pain or have trouble breathing. MAKE SURE YOU:   Understand these instructions.  Will watch your condition.  Will get help right away if you are not doing well or get worse. Document Released: 11/26/2001 Document Revised: 05/29/2011 Document Reviewed: 06/11/2013 [facility]Exit[facility] Patient Information 2015 [place], [place]. This information is not intended to replace advice given to you by your health care provider. Make sure you discuss any questions you have with your health care provider.

## 2014-02-26 ENCOUNTER — Encounter: Payer: Self-pay | Admitting: Family Medicine

## 2014-02-26 ENCOUNTER — Other Ambulatory Visit: Payer: Self-pay | Admitting: *Deleted

## 2014-02-26 DIAGNOSIS — Z780 Asymptomatic menopausal state: Secondary | ICD-10-CM

## 2014-02-26 LAB — VITAMIN D 25 HYDROXY (VIT D DEFICIENCY, FRACTURES): Vit D, 25-Hydroxy: 34 ng/mL (ref 30–100)

## 2014-02-26 MED ORDER — AMBULATORY NON FORMULARY MEDICATION: 1.0000 mL | Freq: Two times a day (BID) | Status: DC

## 2014-05-12 LAB — HM DEXA SCAN

## 2014-05-12 LAB — HM MAMMOGRAPHY

## 2014-05-13 ENCOUNTER — Encounter: Payer: Self-pay | Admitting: *Deleted

## 2014-05-15 ENCOUNTER — Encounter: Payer: Self-pay | Admitting: Internal Medicine

## 2014-05-18 ENCOUNTER — Telehealth: Payer: Self-pay | Admitting: *Deleted

## 2014-05-18 NOTE — Telephone Encounter (Signed)
Patient notified of DEXA results

## 2014-11-30 ENCOUNTER — Other Ambulatory Visit: Payer: Self-pay | Admitting: *Deleted

## 2014-11-30 ENCOUNTER — Telehealth: Payer: Self-pay

## 2014-11-30 DIAGNOSIS — Z780 Asymptomatic menopausal state: Secondary | ICD-10-CM

## 2014-11-30 MED ORDER — AMBULATORY NON FORMULARY MEDICATION: 1.0000 mL | Freq: Two times a day (BID) | Status: DC

## 2014-11-30 NOTE — Telephone Encounter (Signed)
error 

## 2014-12-01 ENCOUNTER — Telehealth: Payer: Self-pay

## 2014-12-01 NOTE — Telephone Encounter (Signed)
Pharmacy called about this patients prescription for Bi-est (6:4)+PROGEST+TEST 0.3MG  = 2% + 0.025MG  CREAM. 60 QUANTITY. Apply 1 mL topically 2 (two) times daily. They said they haven't heard anything back about it. They gave a number to call back, it is [phone]

## 2014-12-01 NOTE — Telephone Encounter (Signed)
Per the computer, [place] phoned in a refill for it yesterday, for 60mL with 2 refills.  Please verify in computer and advise them--she was dealing with this yesterday afternoon (they had sent 2 faxes (that were put in my red folder), each requesting a different quantity.  She called yesterday and clarified, and phoned it in

## 2015-02-04 ENCOUNTER — Ambulatory Visit (INDEPENDENT_AMBULATORY_CARE_PROVIDER_SITE_OTHER): Payer: Medicare Other | Admitting: Family Medicine

## 2015-02-04 ENCOUNTER — Encounter: Payer: Self-pay | Admitting: Family Medicine

## 2015-02-04 VITALS — BP 118/68 | HR 60 | Temp 99.9°F | Ht 67.5 in | Wt 138.4 lb

## 2015-02-04 DIAGNOSIS — J069 Acute upper respiratory infection, unspecified: Secondary | ICD-10-CM

## 2015-02-04 NOTE — Patient Instructions (Signed)
  Drink plenty of fluids. Continue guaifenesin and DM (robitussin DM or Mucinex DM) to help loosen the secretions and suppress the cough. Add a decongestant such as Sudafed to your daily regimen--this should help with the nasal drainage and decrease the amount that drains down the throat and contributes to the cough and chest congestion.  Use tylenol or ibuprofen as needed for fever or pain. Consider sinus rinses or neti-pot. Use nasal saline spray if bleeding recurs.  Call Monday for antibiotic if your symptoms persist/worsen (be specific in the message you leave--having fever? Discolored mucus? Which symptoms are worse vs better).

## 2015-02-04 NOTE — Progress Notes (Signed)
Chief Complaint  Patient presents with  . Cough    and chest congestion that started last Thursday. Lots of nasal congestion. Thinks it may be going into to her ears, right is worse than left.    Last week she started feeling fatigued and run down, and then started having chest congestion. She has been using Robitussin DM, no other OTC meds--it helped some. She has not taken any other OTC meds, no allergy meds or decongestants. She is also having nasal stuffiness, bleeding, along with postnasal drainage.  Denies sinus pain or pressure. She is now having some discomfort in her right ear--feels echoey, like there is fluid in there.  Nasal mucus has been clear recently (bleeding stopped).  Cough is rattling in her chest, but not to able to expectorate much phlegm, just small amount and not discolored.  She never noticed any fevers, had slight chills (didn't check temp). +sick contacts the week prior to her illness  PMH, PSH, SH reviewed.  Outpatient Encounter Prescriptions as of 02/04/2015  Medication Sig Note  . AMBULATORY NON FORMULARY MEDICATION Apply 1 mL topically 2 (two) times daily. BI-EST(6:4)+PROGEST+TEST 0.3MG  = 2% + 0.025MG  CREAM. 60 QUANTITY.   Mar[place] Kitchen Dextromethorphan-Guaifenesin (ROBITUSSIN DM PO) Take 10 mLs by mouth every 4 (four) hours.   . dorzolamide (TRUSOPT) 2 % ophthalmic solution PLACE 1 DROP INTO AFFECTED EYE(S) TWICE A DAY 02/04/2015: Received from: External Pharmacy  . latanoprost (XALATAN) 0.005 % ophthalmic solution Place 1 drop into both eyes at bedtime.     . LUTEIN PO Take by mouth.     . Multiple Minerals-Vitamins (CITRACAL PLUS) TABS Take 1 tablet by mouth daily.     . Multiple Vitamins-Minerals (CENTRUM SILVER PO) Take 1 tablet by mouth daily.     . [DISCONTINUED] dorzolamide-timolol (COSOPT) 22.3-6.8 MG/ML ophthalmic solution Place 1 drop into both eyes 2 (two) times daily.    No facility-administered encounter medications on file as of 02/04/2015.   Allergies   Allergen Reactions  . Cortisone Other (See Comments)    Entire body hurt from a cortisone shot.  . Fosamax [Alendronate Sodium]     GI upset, bone pain; tolerated Actonel  . Tramadol Rash   ROS: no fever, chills, dizziness, chest pain, shortness of breath, palpitations, vomiting, diarrhea, bleeding, bruising, rash.  See HPI.  PHYSICAL EXAM: BP 118/68 mmHg  Pulse 60  Temp(Src) 99.9 F (37.7 C) (Tympanic)  Ht 5' 7.5" (1.715 m)  Wt 138 lb 6.4 oz (62.778 kg)  BMI 21.34 kg/m2  LMP 03/20/2000  Well developed, pleasant female, who sounds very congested, with throat clearing, in no distress. HEENT: PERRL, EOMI, conjunctiva clear.  TM's and EAC's normal.  Nasal mucosa is moderately edematous, slightly red with clear mucus, some yellow crusting and dried blood in the left nares.  Sinuses are nontender.  OP is notable for cobblestoning posteriorly.  Otherwise normal, no lesions Neck: no lymphadenopathy or mass Heart: regular rate and rhythm without murmur Lungs:clear bilaterally with good air movement. Skin: normal turgor, no rash Extremities: no edema Neuro: alert and oriented. Cranial nerves intact. Normal gait, strength Psych: normal mood, affect, hygiene and grooming.  ASSESSMENT/PLAN:  Acute upper respiratory infection no evidence of bacterial infection at this time, likely viral.  Drink plenty of fluids. Continue guaifenesin and DM (robitussin DM or Mucinex DM) to help loosen the secretions and suppress the cough. Add a decongestant such as Sudafed to your daily regimen--this should help with the nasal drainage and decrease the amount that  drains down the throat and contributes to the cough and chest congestion.  Use tylenol or ibuprofen as needed for fever or pain. Consider sinus rinses or neti-pot. Use nasal saline spray if bleeding recurs.  Call Monday for antibiotic if your symptoms persist/worsen (be specific in the message you leave--having fever? Discolored mucus? Which  symptoms are worse vs better).

## 2015-02-08 ENCOUNTER — Telehealth: Payer: Self-pay | Admitting: Family Medicine

## 2015-02-08 MED ORDER — AZITHROMYCIN 250 MG PO TABS: ORAL_TABLET | ORAL | Status: DC

## 2015-02-08 NOTE — Telephone Encounter (Signed)
Advise pt that z-pak was sent to her pharmacy. Contact us next week if not better.  A virus shouldn't still have persistent fevers like this.

## 2015-02-08 NOTE — Telephone Encounter (Signed)
Pt says no change in mucus color, fever was 101 yesterday and today, still has chest congestion & cough, ears are better. If med is sent in, send to [facility] [address]

## 2015-03-04 ENCOUNTER — Ambulatory Visit (INDEPENDENT_AMBULATORY_CARE_PROVIDER_SITE_OTHER): Payer: Medicare Other | Admitting: Family Medicine

## 2015-03-04 ENCOUNTER — Encounter: Payer: Self-pay | Admitting: Family Medicine

## 2015-03-04 VITALS — BP 132/62 | HR 60 | Ht 67.0 in | Wt 134.4 lb

## 2015-03-04 DIAGNOSIS — J309 Allergic rhinitis, unspecified: Secondary | ICD-10-CM | POA: Diagnosis not present

## 2015-03-04 DIAGNOSIS — E7800 Pure hypercholesterolemia, unspecified: Secondary | ICD-10-CM | POA: Diagnosis not present

## 2015-03-04 DIAGNOSIS — Z1159 Encounter for screening for other viral diseases: Secondary | ICD-10-CM

## 2015-03-04 DIAGNOSIS — M810 Age-related osteoporosis without current pathological fracture: Secondary | ICD-10-CM

## 2015-03-04 DIAGNOSIS — E559 Vitamin D deficiency, unspecified: Secondary | ICD-10-CM

## 2015-03-04 DIAGNOSIS — R197 Diarrhea, unspecified: Secondary | ICD-10-CM | POA: Diagnosis not present

## 2015-03-04 DIAGNOSIS — Z0000 Encounter for general adult medical examination without abnormal findings: Secondary | ICD-10-CM

## 2015-03-04 DIAGNOSIS — Z23 Encounter for immunization: Secondary | ICD-10-CM

## 2015-03-04 DIAGNOSIS — R001 Bradycardia, unspecified: Secondary | ICD-10-CM | POA: Diagnosis not present

## 2015-03-04 LAB — CBC WITH DIFFERENTIAL/PLATELET
Basophils Absolute: 0.1 10*3/uL (ref 0.0–0.1)
Basophils Relative: 1 % (ref 0–1)
EOS PCT: 2 % (ref 0–5)
Eosinophils Absolute: 0.1 10*3/uL (ref 0.0–0.7)
HEMATOCRIT: 38.5 % (ref 36.0–46.0)
HEMOGLOBIN: 12.4 g/dL (ref 12.0–15.0)
LYMPHS ABS: 1.3 10*3/uL (ref 0.7–4.0)
LYMPHS PCT: 24 % (ref 12–46)
MCH: 29.7 pg (ref 26.0–34.0)
MCHC: 32.2 g/dL (ref 30.0–36.0)
MCV: 92.1 fL (ref 78.0–100.0)
MONO ABS: 0.6 10*3/uL (ref 0.1–1.0)
MPV: 9.1 fL (ref 8.6–12.4)
Monocytes Relative: 11 % (ref 3–12)
NEUTROS ABS: 3.5 10*3/uL (ref 1.7–7.7)
Neutrophils Relative %: 62 % (ref 43–77)
Platelets: 265 10*3/uL (ref 150–400)
RBC: 4.18 MIL/uL (ref 3.87–5.11)
RDW: 12.8 % (ref 11.5–15.5)
WBC: 5.6 10*3/uL (ref 4.0–10.5)

## 2015-03-04 LAB — COMPREHENSIVE METABOLIC PANEL
ALT: 14 U/L (ref 6–29)
AST: 18 U/L (ref 10–35)
Albumin: 3.9 g/dL (ref 3.6–5.1)
Alkaline Phosphatase: 75 U/L (ref 33–130)
BUN: 16 mg/dL (ref 7–25)
CHLORIDE: 107 mmol/L (ref 98–110)
CO2: 25 mmol/L (ref 20–31)
Calcium: 9 mg/dL (ref 8.6–10.4)
Creat: 0.78 mg/dL (ref 0.50–0.99)
Glucose, Bld: 82 mg/dL (ref 65–99)
Potassium: 4.5 mmol/L (ref 3.5–5.3)
SODIUM: 139 mmol/L (ref 135–146)
Total Bilirubin: 0.3 mg/dL (ref 0.2–1.2)
Total Protein: 6.6 g/dL (ref 6.1–8.1)

## 2015-03-04 LAB — LIPID PANEL
CHOL/HDL RATIO: 3 ratio (ref <=5.0)
CHOLESTEROL: 165 mg/dL (ref 125–200)
HDL: 55 mg/dL (ref >=46)
LDL CALC: 93 mg/dL (ref <130)
Triglycerides: 84 mg/dL (ref <150)
VLDL: 17 mg/dL (ref <30)

## 2015-03-04 LAB — POCT URINALYSIS DIPSTICK
Bilirubin, UA: NEGATIVE
Blood, UA: NEGATIVE
Glucose, UA: NEGATIVE
KETONES UA: NEGATIVE
Leukocytes, UA: NEGATIVE
Nitrite, UA: NEGATIVE
PH UA: 7.5
PROTEIN UA: NEGATIVE
SPEC GRAV UA: 1.02
UROBILINOGEN UA: NEGATIVE

## 2015-03-04 LAB — TSH: TSH: 1.412 u[IU]/mL (ref 0.350–4.500)

## 2015-03-04 NOTE — Progress Notes (Signed)
Chief Complaint  Patient presents with  . Annual Exam    CPE/AWV with pelvic. Just had eye exam last Friday with glaucoma specialist. Will get UA when she is on the way out. Still has runny nose from last visit. Mentions that she has had diarrhea over a year, only in the mornings.    Paige Klein is a 64 y.o. female who presents for Welcome to Medicare visit, as well as f/u on chronic issues.  She has some persistent sniffling and cough, since her illness from last month.  She has a tickle in her throat.  After 4 bottles of Robitussin, she stopped using it, also had taken sudafed tablets, not currently.  She has some periodic sneezing.  Every morning over the last year she has very soft stools or diarrhea. This occurs when she first wakes up, not after eating. She has had celiac testing in the past (with routine colonoscopy, due to uncle with celiac disease), but she wasn't having any diarrhea at that time.  Denies incontinence, melena or hematochezia, no abdominal pain (occasional slight cramp). Denies change in her diet, eating more soups rather than sandwiches. No change in dairy, greens. Soups aren't creamy.  Osteoporosis: She has been off the fosamax since 11/2011 (since reading about adverse effects). She took Actonel x 6 years, subsequently changed to Fosamax (insurance reasons). She thinks she had been on medications for about 10 years. (For many years she took med with well water, and it was felt it wasn't being absorbed, bone density continued to decline. Stabilized since changing to taking it with distilled water). She says since stopping Fosamax, she feels better, bone pains/aches are better, and left hip pain has completely resolved. (She also quit working, and no longer has stairs at home). She noticed stiffness in her yoga class, even after not working, while still taking the fosamax.Her last DEXA showed no significant change (still showed osteoporosis, no decline from study 2  years prior).  Postmenopausal with atrophic vaginitis, uses compounded progesterone (bi-est (6:4)+progest+test 0.3mg +2%+0.025mg  1cc topically--she admits to only taking it once daily (rather than the twice daily), and feels like it helps at this dose.   Immunization History  Administered Date(s) Administered  . Influenza Split 03/21/2007, 03/20/2008, 01/19/2011  . Influenza, Seasonal, Injecte, Preservative Fre 02/21/2012  . Influenza,inj,Quad PF,36+ Mos 02/24/2013, 02/25/2014  . PPD Test 02/11/1997  . Td 10/04/1990, 03/18/2001  . Tdap 04/13/2009  . Zoster 08/20/2012   Last Pap smear: 02/2013--normal with no high risk HPV Last mammogram: 04/2014 Last colonoscopy: 07/2011 Dr. Loreta Ave Last DEXA: 03/2012: T-2.9 in spine, but unchanged when compared to scan in 2011; 04/2014: T-2.7, no statistically significant change.  S/P treatment with fosamax/actonel for 10 years; off meds since 11/2011. Ophtho: every 6 months (glaucoma); had appointment earlier this week at [facility] Dentist: regularly, every 6 months Exercise: yoga once a week; previously went to the gym once a week (treadmill and weights), but not recently. Vitamin D level was normal twice in the past (34 last year)  Other doctors caring for patient include: GI: Dr. Loreta Ave Ophtho: Dr. Emily Filbert  [facility] specialist at [facility]: Dr. Lottie Dawson Dentist: Dr. Renard Hamper GYN: Dr. Chevis Pretty   Depression screen: Negative Fall screen:  Larey Seat once in a parking deck (change in elevation, step down she didn't see), sprained foot. Functional status screen--notable for occasional urge/stress incontinence.  See screens in epic.  End of Life Discussion:  Patient does not have a living will and medical power of attorney  Past  Medical History  Diagnosis Date  . Unspecified vitamin D deficiency 2009  . Glaucoma suspect of both eyes     high-normal intraocular pressures (Dr. Emily Filbert)  . Abnormal Pap smear of cervix 1999    ASGUS x 1  . Kidney stone 1986    right  . Osteoporosis   .  Allergic rhinitis, cause unspecified     mold; and vasomotor rhinitis  . Migraine headache     resolved with menopause (menstrual migraines)  . MVP (mitral valve prolapse)     per echo  . Fibrocystic breast changes     and also breast calcifications (08/2008)  . Postmenopausal     on compounded cream daily  . Cataract   . Internal hemorrhoids   . H/O mammogram 11/23/10    normal  . Pap smear for cervical cancer screening 04/14/09    negative for malignancy, +atrophic changes  . H/O bone density study 09/07/09    08/16/07, 08/08/05; osteoporosis  . Hypercholesteremia     borderline per NMR lipoprofile 2008, 2007  . Glaucoma     Past Surgical History  Procedure Laterality Date  . Cervical polypectomy  2003    treated with cryosurgery  . Colonoscopy  08/02/11; 05/2000    Dr. Loreta Ave - rectal polyps 2013  . Breast cyst aspiration      many (Dr. Maryagnes Amos)  . Endocervical cyst  12/96, 6/97    Dr Chevis Pretty  . Cardiolite (stress test)  11/05    Dr. Ermalinda Memos  . Cataract surgery  2010    bilateral  . Breast fibroadenoma surgery  09-03    Dr.Gooden  . Dilation and curettage of uterus  05/2009    uterine polyp, bleeding    Social History   Social History  . Marital Status: Divorced    Spouse Name: N/A  . Number of Children: 0  . Years of Education: N/A   Occupational History  . 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 Smoking status: Never Smoker   . Smokeless tobacco: Never Used  . Alcohol Use: 0.0 oz/week    0 Standard drinks or equivalent per week     Comment: 2-3 drinks once a week, social  . Drug Use: No  . Sexual Activity:    Partners: Male     Comment: has boyfriend, not currently sexually active   Other Topics Concern  . Not on file   Social History Narrative   Divorced, no children, exercise with yoga, goes to the [employer], school psychologist - retired.   Lives alone, 1 cat    Family History  Problem Relation Age  of Onset  . Hypertension Mother   . Arthritis Mother   . Cancer Mother     breast cancer in her 34's and 55's (bilateral)  . Hypertension Father   . Other Father     died of pancreatitis  . Lichen planus Sister   . Celiac disease Maternal Uncle   . Cancer Maternal Grandmother     cervical and ovarian  . Cancer Maternal Grandfather     pancreatic  . Muscular dystrophy Maternal Aunt     adult onset  . Cancer Cousin     mouth (nonsmoker)    Outpatient Encounter Prescriptions as of 03/04/2015  Medication Sig Note  . AMBULATORY NON FORMULARY MEDICATION Apply 1 mL topically 2 (two) times daily. BI-EST(6:4)+PROGEST+TEST 0.3MG  = 2% + 0.025MG  CREAM. 60 QUANTITY. 03/04/2015: Uses it once daily  usually  . dorzolamide (TRUSOPT) 2 % ophthalmic solution PLACE 1 DROP INTO AFFECTED EYE(S) THREE TIMES A DAY 02/04/2015: Received from: External Pharmacy  . latanoprost (XALATAN) 0.005 % ophthalmic solution Place 1 drop into both eyes at bedtime.     . LUTEIN PO Take by mouth.     . Multiple Minerals-Vitamins (CITRACAL PLUS) TABS Take 1 tablet by mouth daily.     . Multiple Vitamins-Minerals (CENTRUM SILVER PO) Take 1 tablet by mouth daily.     . [DISCONTINUED] azithromycin (ZITHROMAX) 250 MG tablet Take 2 tablets by mouth on first day, then 1 tablet by mouth on days 2 through 5   . [DISCONTINUED] Dextromethorphan-Guaifenesin (ROBITUSSIN DM PO) Take 10 mLs by mouth every 4 (four) hours.    No facility-administered encounter medications on file as of 03/04/2015.    Allergies  Allergen Reactions  . Brinzolamide-Brimonidine Itching  . Cortisone Other (See Comments)    Entire body hurt from a cortisone shot.  . Fosamax [Alendronate Sodium]     GI upset, bone pain; tolerated Actonel  . Timolol   . Tramadol Rash   ROS: The patient denies anorexia, fever, weight changes, headaches, vision changes (slight visual field loss from glaucoma, on left field of vision), decreased hearing, ear pain, sore  throat, breast concerns, chest pain, palpitations, dizziness, syncope, dyspnea on exertion (slight--see HPI, since heart rate lower), cough, swelling, nausea, vomiting, constipation, abdominal pain, melena, hematochezia, indigestion/heartburn, hematuria,dysuria, postmenopausal bleeding, vaginal discharge, odor or itch, genital lesions, numbness, tingling, weakness, tremor, suspicious skin lesions, depression, anxiety, abnormal bleeding/bruising, or enlarged lymph nodes. Some urinary urgency, only rare leakage (on the way to the bathroom); recently some stress incontinence with bad cough. +sniffles, PND as per HPI. Loose stool every morning over the last year, per HPI.   PHYSICAL EXAM:  BP 138/70 mmHg  Pulse 60  Ht 5\' 7"  (1.702 m)  Wt 134 lb 6.4 oz (60.963 kg)  BMI 21.04 kg/m2  LMP 03/20/2000 132/62 on repeat by MD  General Appearance:  Alert, cooperative, no distress, appears her age  Head:  Normocephalic, without obvious abnormality, atraumatic   Eyes:  PERRL, conjunctiva/corneas clear, EOM's intact; fundi without hemorrhage or exudate  Ears:  Normal TM's and external ear canals   Nose:  Nares normal, mucosa is mildly edematous, no erythema, no drainage or sinus tenderness. Small amount of clear mucus in nares  Throat:  Lips, mucosa, and tongue normal; teeth and gums normal   Neck:  Supple, no lymphadenopathy; thyroid: no enlargement/tenderness/nodules; no carotid  bruit or JVD   Back:  Spine nontender, no curvature, ROM normal, no CVA tenderness   Lungs:  Clear to auscultation bilaterally without wheezes, rales or rhonchi; respirations unlabored   Chest Wall:  No tenderness or deformity   Heart:  Regular  Rhythm, bradycardic;  S1 and S2 normal, no murmur, rub  or gallop.   Breast Exam:  No tenderness, masses, or nipple discharge or inversion. No axillary lymphadenopathy   Abdomen:  Soft, non-tender, nondistended, normoactive bowel  sounds,  no masses, no hepatosplenomegaly   Genitalia:  Normal external genitalia without lesions. Mild atrophic changes. No cervical motion tenderness. Uterus and adnexa not enlarged, nontender, no masses. Pap not performed   Rectal:  Normal tone, no masses or tenderness; guaiac negative stool   Extremities:  nontender joints and extremities, no clubbing, cyanosis or edema.    Pulses:  2+ and symmetric all extremities   Skin:  Skin color, texture, turgor normal. scattered benign lesions.  Left medial breast with 2.62mm round cherry red lesion with irregular border but unchanged for years per pt, right upper abdomen with 4mm x 1mm raised oval stuck on appearing lesion c/w seborrheic keratosis. Upper central chest has a 10x21mm flesh-colored raised area. Smooth, no telangiectasia. Unchanged per pt, without any bleeding/scabbing.some hyperpigmentation at her medial lower calf on left. Nontender. Just above this is a round lesion she states has been present x years, unchanged.  Lymph nodes:  Cervical, supraclavicular, and axillary nodes normal   Neurologic:  CNII-XII intact, normal strength, sensation and gait; reflexes 2+ and symmetric throughout    Psych:   Normal mood, affect, hygiene and grooming       EKG: sinus bradycardia, rate 42. Otherwise normal.   ASSESSMENT/PLAN:  Welcome to Medicare preventive visit - Plan: EKG 12-Lead, POCT Urinalysis Dipstick  Osteoporosis - continue calcium, D, weight-bearing exercise 2x/wk; Recheck DEXA 04/2016; s/p 25yrs of bisphosphonate. Prolia if decline  Vitamin D deficiency - adequately replaced per prior labs; continue supplementation  Pure hypercholesterolemia - Plan: Lipid panel  Diarrhea, unspecified type - discussed diet, probiotics. check labs. Only occurs 1x/d in mornings - Plan: Comprehensive metabolic panel, CBC with Differential/Platelet, TSH, Celiac Panel  Need for hepatitis C screening  test - Plan: Hepatitis C antibody  Immunization due - Plan: Flu vaccine HIGH DOSE PF (Fluzone High dose), Pneumococcal conjugate vaccine 13-valent  Bradycardia - asymptomatic  Allergic rhinitis, unspecified allergic rhinitis type   Cough/sniffles--suspect allergies Discussed OTC allergy meds, guaifenesin prn.  Diarrhea--discussed lactose free diet short-term, f/b gluten-free if not improving. Discussed probiotics. Check labs.  Discussed monthly self breast exams and yearly mammograms; at least 30 minutes of aerobic activity at least 5 days/week, weight-bearing exercise 2x/week; proper sunscreen use reviewed; healthy diet, including goals of calcium and vitamin D intake and alcohol recommendations (less than or equal to 1 drink/day) reviewed; regular seatbelt use; changing batteries in smoke detectors. Immunization recommendations discussed-prevnar and high dose flu shot given today.risks/side effects reviewed. Colonoscopy recommendations reviewed--UTD  Osteoporosis and vitamin D deficiency: continue calcium and vitamin D as recommended. dexa due 04/2016 Postmenopausal - c/t compounded HRT. discussed risks/benefits. Await refill request from pharmacy, when needed  Paperwork given for Living Will and healthcare power of attorney; asked to give [place] copies when completed. MOST form filled out. Full Code, Full Care   Medicare Attestation I have personally reviewed: The patient's medical and social history Their use of alcohol, tobacco or illicit drugs Their current medications and supplements The patient's functional ability including ADLs,fall risks, home safety risks, cognitive, and hearing and visual impairment Diet and physical activities Evidence for depression or mood disorders  The patient's weight, height, and BMI have been recorded in the chart. Visual acuity not checked since she had visit with eye doctor earlier this week. I have made referrals, counseling, and provided  education to the patient based on review of the above and I have provided the patient with a written personalized care plan for preventive services.     Ezariah Nace A, MD   03/04/2015

## 2015-03-04 NOTE — Patient Instructions (Addendum)
HEALTH MAINTENANCE RECOMMENDATIONS:  It is recommended that you get at least 30 minutes of aerobic exercise at least 5 days/week (for weight loss, you may need as much as 60-90 minutes). This can be any activity that gets your heart rate up. This can be divided in 10-15 minute intervals if needed, but try and build up your endurance at least once a week.  Weight bearing exercise is also recommended twice weekly.  Eat a healthy diet with lots of vegetables, fruits and fiber.  "Colorful" foods have a lot of vitamins (ie green vegetables, tomatoes, red peppers, etc).  Limit sweet tea, regular sodas and alcoholic beverages, all of which has a lot of calories and sugar.  Up to 1 alcoholic drink daily may be beneficial for women (unless trying to lose weight, watch sugars).  Drink a lot of water.  Calcium recommendations are 1200-1500 mg daily (1500 mg for postmenopausal women or women without ovaries), and vitamin D 1000 IU daily.  This should be obtained from diet and/or supplements (vitamins), and calcium should not be taken all at once, but in divided doses.  Monthly self breast exams and yearly mammograms for women over the age of 44 is recommended.  Sunscreen of at least SPF 30 should be used on all sun-exposed parts of the skin when outside between the hours of 10 am and 4 pm (not just when at beach or pool, but even with exercise, golf, tennis, and yard work!)  Use a sunscreen that says "broad spectrum" so it covers both UVA and UVB rays, and make sure to reapply every 1-2 hours.  Remember to change the batteries in your smoke detectors when changing your clock times in the spring and fall.  Use your seat belt every time you are in a car, and please drive safely and not be distracted with cell phones and texting while driving.    Paige Klein , Thank you for taking time to come for your Welcome to Medicare Physical. I appreciate your ongoing commitment to your health goals. Please review the  following plan we discussed and let me know if I can assist you in the future.   These are the goals we discussed: Goals    None      This is a list of the screening recommended for you and due dates:  Health Maintenance  Topic Date Due  .  Hepatitis C: One time screening is recommended by Center for Disease Control  (CDC) for  adults born from [birth year] through 1966.   16-Jan-1951  . HIV Screening  08/25/1965  . Pneumonia vaccines (1 of 2 - PCV13) 08/26/2014  . Flu Shot  10/19/2014  . Pap Smear  02/25/2016  . Mammogram  05/12/2016  . Tetanus Vaccine  04/14/2019  . Colon Cancer Screening  08/01/2021  . DEXA scan (bone density measurement)  Completed  . Shingles Vaccine  Completed   Hepatitis C screening is being done today. You have had HIV screen in the past, not high risk. You were given flu shot and pneumonia vaccine (prevnar) today. You will get a different type of pneumonia vaccine (pneumovax) next year Mammograms are recommended yearly--due 04/2015 I do not know the correct date of your next needed colonoscopy--if you had adenomatous polyps on the one in 2013, then you will be due in 2018, not 2023.  You should receive notification from Dr. Kenna Gilbert office when you are due. Currently up to date.  We may need to consider it sooner, if  your diarrhea worsens. Your next bone density test is due 04/2016.  Consider trying probiotics (Align, Ultra Flora IB are good ones), consider lactose-free diet for a week or two

## 2015-03-05 ENCOUNTER — Encounter: Payer: Self-pay | Admitting: Family Medicine

## 2015-03-05 LAB — GLIA (IGA/G) + TTG IGA
GLIADIN IGA: 18 U (ref <20)
Gliadin IgG: 2 Units (ref <20)
TISSUE TRANSGLUTAMINASE AB, IGA: 1 U/mL (ref <4)

## 2015-03-05 LAB — HEPATITIS C ANTIBODY: HCV AB: NEGATIVE

## 2015-03-26 DIAGNOSIS — H4423 Degenerative myopia, bilateral: Secondary | ICD-10-CM | POA: Insufficient documentation

## 2015-03-26 DIAGNOSIS — H401190 Primary open-angle glaucoma, unspecified eye, stage unspecified: Secondary | ICD-10-CM | POA: Insufficient documentation

## 2015-04-16 ENCOUNTER — Other Ambulatory Visit: Payer: Self-pay | Admitting: Orthopedic Surgery

## 2015-04-22 ENCOUNTER — Encounter (HOSPITAL_COMMUNITY)
Admission: RE | Admit: 2015-04-22 | Discharge: 2015-04-22 | Disposition: A | Discharge status: Home / Self Care | Payer: Medicare Other | Source: Ambulatory Visit | Attending: Orthopedic Surgery | Admitting: Orthopedic Surgery

## 2015-04-22 ENCOUNTER — Encounter (HOSPITAL_COMMUNITY): Payer: Self-pay

## 2015-04-22 DIAGNOSIS — M810 Age-related osteoporosis without current pathological fracture: Secondary | ICD-10-CM | POA: Diagnosis not present

## 2015-04-22 DIAGNOSIS — Z79899 Other long term (current) drug therapy: Secondary | ICD-10-CM | POA: Diagnosis not present

## 2015-04-22 DIAGNOSIS — Z01818 Encounter for other preprocedural examination: Secondary | ICD-10-CM | POA: Insufficient documentation

## 2015-04-22 DIAGNOSIS — E7800 Pure hypercholesterolemia, unspecified: Secondary | ICD-10-CM | POA: Diagnosis not present

## 2015-04-22 DIAGNOSIS — J309 Allergic rhinitis, unspecified: Secondary | ICD-10-CM | POA: Insufficient documentation

## 2015-04-22 DIAGNOSIS — I341 Nonrheumatic mitral (valve) prolapse: Secondary | ICD-10-CM | POA: Diagnosis not present

## 2015-04-22 DIAGNOSIS — Z01812 Encounter for preprocedural laboratory examination: Secondary | ICD-10-CM | POA: Insufficient documentation

## 2015-04-22 LAB — PROTIME-INR
INR: 1.04 (ref 0.00–1.49)
Prothrombin Time: 13.8 seconds (ref 11.6–15.2)

## 2015-04-22 LAB — SURGICAL PCR SCREEN
MRSA, PCR: NEGATIVE
STAPHYLOCOCCUS AUREUS: NEGATIVE

## 2015-04-22 LAB — COMPREHENSIVE METABOLIC PANEL
ALBUMIN: 4.2 g/dL (ref 3.5–5.0)
ALT: 19 U/L (ref 14–54)
ANION GAP: 9 (ref 5–15)
AST: 24 U/L (ref 15–41)
Alkaline Phosphatase: 54 U/L (ref 38–126)
BUN: 20 mg/dL (ref 6–20)
CHLORIDE: 107 mmol/L (ref 101–111)
CO2: 25 mmol/L (ref 22–32)
Calcium: 9.6 mg/dL (ref 8.9–10.3)
Creatinine, Ser: 0.88 mg/dL (ref 0.44–1.00)
GFR calc Af Amer: >60 mL/min (ref >60)
GFR calc non Af Amer: >60 mL/min (ref >60)
GLUCOSE: 88 mg/dL (ref 65–99)
POTASSIUM: 4.4 mmol/L (ref 3.5–5.1)
SODIUM: 141 mmol/L (ref 135–145)
Total Bilirubin: 0.4 mg/dL (ref 0.3–1.2)
Total Protein: 7.5 g/dL (ref 6.5–8.1)

## 2015-04-22 LAB — CBC WITH DIFFERENTIAL/PLATELET
BASOS PCT: 0 %
Basophils Absolute: 0 10*3/uL (ref 0.0–0.1)
EOS ABS: 0.1 10*3/uL (ref 0.0–0.7)
Eosinophils Relative: 2 %
HCT: 40.5 % (ref 36.0–46.0)
Hemoglobin: 13.1 g/dL (ref 12.0–15.0)
Lymphocytes Relative: 28 %
Lymphs Abs: 1.6 10*3/uL (ref 0.7–4.0)
MCH: 30.3 pg (ref 26.0–34.0)
MCHC: 32.3 g/dL (ref 30.0–36.0)
MCV: 93.5 fL (ref 78.0–100.0)
MONO ABS: 0.5 10*3/uL (ref 0.1–1.0)
MONOS PCT: 8 %
NEUTROS PCT: 62 %
Neutro Abs: 3.6 10*3/uL (ref 1.7–7.7)
Platelets: 283 10*3/uL (ref 150–400)
RBC: 4.33 MIL/uL (ref 3.87–5.11)
RDW: 13.6 % (ref 11.5–15.5)
WBC: 5.8 10*3/uL (ref 4.0–10.5)

## 2015-04-22 LAB — URINALYSIS, ROUTINE W REFLEX MICROSCOPIC
BILIRUBIN URINE: NEGATIVE
Glucose, UA: NEGATIVE mg/dL
Hgb urine dipstick: NEGATIVE
Ketones, ur: NEGATIVE mg/dL
Leukocytes, UA: NEGATIVE
NITRITE: NEGATIVE
Protein, ur: NEGATIVE mg/dL
SPECIFIC GRAVITY, URINE: 1.013 (ref 1.005–1.030)
pH: 7 (ref 5.0–8.0)

## 2015-04-22 LAB — APTT: aPTT: 29 seconds (ref 24–37)

## 2015-04-22 NOTE — Progress Notes (Signed)
Patient states she had a stress/echo done over 10 years ago to check out her MVP.  No follow-up was needed and she can't remember where she had that done.  Does not see a cardiologist and pcp is KNAPP,EVE A, MD

## 2015-04-22 NOTE — Pre-Procedure Instructions (Signed)
GENNELL HOW  04/22/2015      [id number] - [place], [place] - [address] AT COR[address] OF [facility] [address] [place] [address] Phone: [phone] Fax: [phone]  [facility] - [place], [place] - [address] [place] [address] Phone: [phone] Fax: [phone]    Your procedure is scheduled on Thursday February 9th at 1110 am.  Report to [facility] Admitting at 810 A.M.  Call this number if you have problems the morning of surgery:  [phone]   Remember:  Do not eat food or drink liquids after midnight Wednesday Feb 8th.  Take these medicines the morning of surgery with A SIP OF WATER eye drops (if needed and bring with you), methocarbamol (robaxin) if needed  STOP: ALL Vitamins, Supplements, Effient and Herbal Medications, Fish Oils, Aspirins, NSAIDs (Nonsteroidal Anti-inflammatories such as Ibuprofen, Aleve, or Advil), and Goody's/BC Powders 7 days prior to surgery, until after surgery as directed by your physician.     Do not wear jewelry, make-up or nail polish.  Do not wear lotions, powders, or perfumes.  You may wear deodorant.  Do not shave 48 hours prior to surgery.    Do not bring valuables to the hospital.  [facility] is not responsible for any belongings or valuables.  Contacts, dentures or bridgework may not be worn into surgery.  Leave your suitcase in the car.  After surgery it may be brought to your room.  For patients admitted to the hospital, discharge time will be determined by your treatment team.  Patients discharged the day of surgery will not be allowed to drive home.   Please read over the following fact sheets that you were given. Pain Booklet, Coughing and Deep Breathing, MRSA Information and Surgical Site Infection Prevention        Preparing for Surgery at [facility]  Before surgery, you can play an important role.  Because skin is not  sterile, your skin needs to be as free of germs as possible.  You can reduce the number of germs on your skin by washing with CHG (chlorahexidine gluconate) Soap before surgery.  CHG is an antiseptic cleaner with kills germs and bonds with the skin to continue killing germs even after washing.   Please do not use if you have an allergy to CHG or antibacterial soaps.  If your skin becomes reddened/irritated stop using the CHG.  Do not shave (including legs and underarms) for at least 48 hours prior to first CHG shower.  It is okay to shave your face.  Please follow these instructions carefully:  1. Shower with CHG Soap the night before surgery and the morning of Surgery. 2. If you choose to wash your hair, wash your hair first as usual with your normal shampoo. 3. After you shampoo, rinse your hair and body thoroughly to remove the Shampoo. 4. Use CHG as you would any other liquid soap. You can apply chg directly to the skin and wash gently with scrungie or a clean washcloth. 5. Apply the CHG Soap to your body ONLY FROM THE NECK DOWN. Do not use on open wounds or open sores. Avoid contact with your eyes, ears, mouth and genitals (private parts). Wash genitals (private parts) with your normal soap. 6. Wash thoroughly, paying special attention to the area where your surgery will be performed. 7. Thoroughly rinse your body with warm water from the neck down. 8. DO  NOT shower/wash with your normal soap after using and rinsing off the CHG Soap. 9. Pat yourself dry with a clean towel.  10. Wear clean pajamas.  11. Place clean sheets on your bed the night of your first shower and do not sleep with pets.  Day of Surgery  Do not apply any lotions/deodorants the morning of surgery. Please wear clean clothes to the hospital/surgery center.

## 2015-04-23 NOTE — Progress Notes (Signed)
Anesthesia Chart Review: Patient is a 65 year old female scheduled for L1 kyphoplasty on 04/29/15 by Dr. Yevette Edwards.  History includes non-smoker, allergic rhinitis, osteoporosis, migraines, fibrocystic breast, nephrolithiasis, hypercholesterolemia, cataract extraction, breast surgery (fibroadenoma) '10, MVP.  Reported stress test and echo > 10 years ago (Dr. Ermalinda Memos, ~ 2005) for MVP with PRN follow-up recommended. 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   PCP is Dr. [name], last visit for CPE/Medicare visit 03/04/15. Meds include Robaxin, Citracal, MVI, Claritin, Xalatan and Trusopt ophthalmic.  PAT Vitals: BP 121/40, RR 20, HR 51, T 37.1C, O2 sat 100%.  03/04/15 EKG (reviewed by Dr. Lynelle Doctor): SB at 42 bpm.  04/22/15 CXR: Impression: No active cardiopulmonary disease.  Preoperative labs noted.   If no acute changes or symptomatic bradycardia then I would anticiapte that she could proceed as planned.  Velna Ochs [facility] Short Stay Center/Anesthesiology Phone [phone] 04/23/2015 11:15 AM

## 2015-04-28 MED ORDER — CEFAZOLIN SODIUM-DEXTROSE 2-3 GM-% IV SOLR
2.0000 g | INTRAVENOUS | Status: AC
  Administered 2015-04-29: 2 g via INTRAVENOUS
  Filled 2015-04-28: qty 50

## 2015-04-28 MED ORDER — POVIDONE-IODINE 7.5 % EX SOLN
Freq: Once | CUTANEOUS | Status: DC
  Filled 2015-04-28: qty 118

## 2015-04-28 NOTE — H&P (Signed)
PREOPERATIVE H&P  Chief Complaint: low back pain  HPI: Paige Klein is a 65 y.o. female who presents with ongoing pain in the low back  MRI reveals edema throughtout the L1 vertebral body  Patient has failed multiple forms of conservative care and continues to have pain (see office notes for additional details regarding the patient's full course of treatment)  Past Medical History  Diagnosis Date  . Unspecified vitamin D deficiency 2009  . Glaucoma suspect of both eyes     high-normal intraocular pressures (Dr. Emily Filbert)  . Abnormal Pap smear of cervix 1999    ASGUS x 1  . Kidney stone 1986    right  . Osteoporosis   . Allergic rhinitis, cause unspecified     mold; and vasomotor rhinitis  . Migraine headache     resolved with menopause (menstrual migraines)  . MVP (mitral valve prolapse)     per echo  . Fibrocystic breast changes     and also breast calcifications (08/2008)  . Postmenopausal     on compounded cream daily  . Cataract   . Internal hemorrhoids   . H/O mammogram 11/23/10    normal  . Pap smear for cervical cancer screening 04/14/09    negative for malignancy, +atrophic changes  . H/O bone density study 09/07/09    08/16/07, 08/08/05; osteoporosis  . Hypercholesteremia     borderline per NMR lipoprofile 2008, 2007  . Glaucoma    Past Surgical History  Procedure Laterality Date  . Cervical polypectomy  2003    treated with cryosurgery  . Colonoscopy  08/02/11; 05/2000    Dr. Loreta Ave - rectal polyps 2013  . Breast cyst aspiration      many (Dr. Maryagnes Amos)  . Endocervical cyst  12/96, 6/97    Dr Chevis Pretty  . Cardiolite (stress test)  11/05    Dr. Ermalinda Memos  . Cataract surgery  2010    bilateral  . Breast fibroadenoma surgery  09-03    Dr.Gooden  . Dilation and curettage of uterus  05/2009    uterine polyp, bleeding  . Eye surgery     Social History   Social History  . Marital Status: Divorced    Spouse Name: N/A  . Number of Children: 0  . Years  of Education: N/A   Occupational History  . 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 Smoking status: Never Smoker   . Smokeless tobacco: Never Used  . Alcohol Use: 0.0 oz/week    0 Standard drinks or equivalent per week     Comment: 2-3 drinks once a week, social  . Drug Use: No  . Sexual Activity:    Partners: Male     Comment: has boyfriend, not currently sexually active   Other Topics Concern  . Not on file   Social History Narrative   Divorced, no children, exercise with yoga, goes to the [employer], school psychologist - retired.   Lives alone, 1 cat   Family History  Problem Relation Age of Onset  . Hypertension Mother   . Arthritis Mother   . Cancer Mother     breast cancer in her 44's and 54's (bilateral)  . Hypertension Father   . Other Father     died of pancreatitis  . Lichen planus Sister   . Celiac disease Maternal Uncle   . Cancer Maternal Grandmother     cervical and ovarian  . Cancer  Maternal Grandfather     pancreatic  . Muscular dystrophy Maternal Aunt     adult onset  . Cancer Cousin     mouth (nonsmoker)   Allergies  Allergen Reactions  . Brinzolamide-Brimonidine Itching  . Cortisone Other (See Comments)    Entire body hurt from a cortisone shot.  . Fosamax [Alendronate Sodium]     GI upset, bone pain; tolerated Actonel  . Timolol Other (See Comments)    Slowed her heart rate down  . Tramadol Rash   Prior to Admission medications   Medication Sig Start Date End Date Taking? Authorizing Provider  AMBULATORY NON FORMULARY MEDICATION Apply 1 mL topically 2 (two) times daily. BI-EST(6:4)+PROGEST+TEST 0.3MG  = 2% + 0.025MG  CREAM. 60 QUANTITY. Patient taking differently: Apply 1 mL topically 2 (two) times daily. Compounded Hormone Medication. [facility] [place]. BI-EST(6:4)+PROGEST+TEST 0.3MG  = 2% + 0.025MG  CREAM. 60 QUANTITY. 11/30/14  Yes [name], MD  apraclonidine (IOPIDINE)  0.5 % ophthalmic solution Place 1 drop into both eyes 2 (two) times daily. 04/23/15  Yes Historical Provider, MD  dorzolamide (TRUSOPT) 2 % ophthalmic solution PLACE 1 DROP INTO both EYE(S) THREE TIMES A DAY 01/20/15  Yes Historical Provider, MD  latanoprost (XALATAN) 0.005 % ophthalmic solution Place 1 drop into both eyes at bedtime.     Yes Historical Provider, MD  loratadine (CLARITIN) 10 MG tablet Take 10 mg by mouth daily as needed for allergies.   Yes Historical Provider, MD  LUTEIN PO Take 1 capsule by mouth daily.    Yes Historical Provider, MD  methocarbamol (ROBAXIN) 500 MG tablet Take 500 mg by mouth every 6 (six) hours as needed for muscle spasms.   Yes Historical Provider, MD  Multiple Minerals-Vitamins (CITRACAL PLUS) TABS Take 3 tablets by mouth daily.    Yes Historical Provider, MD  Multiple Vitamins-Minerals (CENTRUM SILVER PO) Take 1 tablet by mouth daily.     Yes Historical Provider, MD     All other systems have been reviewed and were otherwise negative with the exception of those mentioned in the HPI and as above.  Physical Exam: There were no vitals filed for this visit.  General: Alert, no acute distress Cardiovascular: No pedal edema Respiratory: No cyanosis, no use of accessory musculature Skin: No lesions in the area of chief complaint Neurologic: Sensation intact distally Psychiatric: Patient is competent for consent with normal mood and affect Lymphatic: No axillary or cervical lymphadenopathy  MUSCULOSKELETAL: + TTP at mid-back  Assessment/Plan: Lumbar 1 compression fracture Plan for Procedure(s): KYPHOPLASTY   Emilee Hero, MD 04/28/2015 8:12 AM

## 2015-04-29 ENCOUNTER — Ambulatory Visit (HOSPITAL_COMMUNITY): Payer: Medicare Other | Admitting: Anesthesiology

## 2015-04-29 ENCOUNTER — Ambulatory Visit (HOSPITAL_COMMUNITY)
Admission: RE | Admit: 2015-04-29 | Discharge: 2015-04-29 | Disposition: A | Discharge status: Home / Self Care | Payer: Medicare Other | Source: Ambulatory Visit | Attending: Orthopedic Surgery | Admitting: Orthopedic Surgery

## 2015-04-29 ENCOUNTER — Encounter (HOSPITAL_COMMUNITY)
Admission: RE | Disposition: A | Discharge status: Home / Self Care | Payer: Self-pay | Source: Ambulatory Visit | Attending: Orthopedic Surgery

## 2015-04-29 ENCOUNTER — Encounter (HOSPITAL_COMMUNITY): Payer: Self-pay | Admitting: *Deleted

## 2015-04-29 ENCOUNTER — Ambulatory Visit (HOSPITAL_COMMUNITY): Payer: Medicare Other | Admitting: Vascular Surgery

## 2015-04-29 ENCOUNTER — Ambulatory Visit (HOSPITAL_COMMUNITY): Payer: Medicare Other

## 2015-04-29 DIAGNOSIS — M4856XA Collapsed vertebra, not elsewhere classified, lumbar region, initial encounter for fracture: Secondary | ICD-10-CM | POA: Diagnosis not present

## 2015-04-29 DIAGNOSIS — Z419 Encounter for procedure for purposes other than remedying health state, unspecified: Secondary | ICD-10-CM

## 2015-04-29 DIAGNOSIS — E7800 Pure hypercholesterolemia, unspecified: Secondary | ICD-10-CM | POA: Insufficient documentation

## 2015-04-29 DIAGNOSIS — M545 Low back pain: Secondary | ICD-10-CM | POA: Diagnosis present

## 2015-04-29 HISTORY — PX: KYPHOPLASTY: SHX5884

## 2015-04-29 SURGERY — KYPHOPLASTY
Anesthesia: General

## 2015-04-29 MED ORDER — BUPIVACAINE-EPINEPHRINE (PF) 0.25% -1:200000 IJ SOLN
INTRAMUSCULAR | Status: AC
  Filled 2015-04-29: qty 30

## 2015-04-29 MED ORDER — ONDANSETRON HCL 4 MG/2ML IJ SOLN
INTRAMUSCULAR | Status: DC | PRN
  Administered 2015-04-29: 4 mg via INTRAVENOUS

## 2015-04-29 MED ORDER — MIDAZOLAM HCL 2 MG/2ML IJ SOLN
INTRAMUSCULAR | Status: AC
  Filled 2015-04-29: qty 2

## 2015-04-29 MED ORDER — ONDANSETRON HCL 4 MG/2ML IJ SOLN
INTRAMUSCULAR | Status: AC
  Filled 2015-04-29: qty 2

## 2015-04-29 MED ORDER — BACITRACIN 500 UNIT/GM EX OINT
TOPICAL_OINTMENT | CUTANEOUS | Status: DC | PRN
  Administered 2015-04-29: 1 via TOPICAL

## 2015-04-29 MED ORDER — NEOSTIGMINE METHYLSULFATE 10 MG/10ML IV SOLN
INTRAVENOUS | Status: AC
  Filled 2015-04-29: qty 1

## 2015-04-29 MED ORDER — ROCURONIUM BROMIDE 50 MG/5ML IV SOLN
INTRAVENOUS | Status: AC
  Filled 2015-04-29: qty 1

## 2015-04-29 MED ORDER — LIDOCAINE HCL (CARDIAC) 20 MG/ML IV SOLN
INTRAVENOUS | Status: AC
  Filled 2015-04-29: qty 5

## 2015-04-29 MED ORDER — EPHEDRINE SULFATE 50 MG/ML IJ SOLN
INTRAMUSCULAR | Status: AC
  Filled 2015-04-29: qty 1

## 2015-04-29 MED ORDER — FENTANYL CITRATE (PF) 100 MCG/2ML IJ SOLN: 25.0000 ug | INTRAMUSCULAR | Status: DC | PRN

## 2015-04-29 MED ORDER — LIDOCAINE HCL (CARDIAC) 20 MG/ML IV SOLN
INTRAVENOUS | Status: DC | PRN
  Administered 2015-04-29: 40 mg via INTRAVENOUS

## 2015-04-29 MED ORDER — PROPOFOL 10 MG/ML IV BOLUS
INTRAVENOUS | Status: DC | PRN
  Administered 2015-04-29: 100 mg via INTRAVENOUS

## 2015-04-29 MED ORDER — FENTANYL CITRATE (PF) 100 MCG/2ML IJ SOLN
INTRAMUSCULAR | Status: DC | PRN
  Administered 2015-04-29 (×2): 50 ug via INTRAVENOUS

## 2015-04-29 MED ORDER — GLYCOPYRROLATE 0.2 MG/ML IJ SOLN
INTRAMUSCULAR | Status: AC
  Filled 2015-04-29: qty 4

## 2015-04-29 MED ORDER — IOHEXOL 300 MG/ML  SOLN
INTRAMUSCULAR | Status: DC | PRN
  Administered 2015-04-29: 50 mL via INTRAVENOUS

## 2015-04-29 MED ORDER — BACITRACIN ZINC 500 UNIT/GM EX OINT
TOPICAL_OINTMENT | CUTANEOUS | Status: AC
  Filled 2015-04-29: qty 28.35

## 2015-04-29 MED ORDER — LACTATED RINGERS IV SOLN
INTRAVENOUS | Status: DC
  Administered 2015-04-29 (×2): via INTRAVENOUS

## 2015-04-29 MED ORDER — NEOSTIGMINE METHYLSULFATE 10 MG/10ML IV SOLN
INTRAVENOUS | Status: DC | PRN
  Administered 2015-04-29: 4 mg via INTRAVENOUS

## 2015-04-29 MED ORDER — GLYCOPYRROLATE 0.2 MG/ML IJ SOLN
INTRAMUSCULAR | Status: DC | PRN
  Administered 2015-04-29: 0.6 mg via INTRAVENOUS
  Administered 2015-04-29: 0.2 mg via INTRAVENOUS

## 2015-04-29 MED ORDER — MIDAZOLAM HCL 5 MG/5ML IJ SOLN
INTRAMUSCULAR | Status: DC | PRN
  Administered 2015-04-29: 1 mg via INTRAVENOUS

## 2015-04-29 MED ORDER — FENTANYL CITRATE (PF) 250 MCG/5ML IJ SOLN
INTRAMUSCULAR | Status: AC
  Filled 2015-04-29: qty 5

## 2015-04-29 MED ORDER — ROCURONIUM BROMIDE 100 MG/10ML IV SOLN
INTRAVENOUS | Status: DC | PRN
  Administered 2015-04-29: 40 mg via INTRAVENOUS

## 2015-04-29 MED ORDER — EPHEDRINE SULFATE 50 MG/ML IJ SOLN
INTRAMUSCULAR | Status: DC | PRN
  Administered 2015-04-29 (×2): 10 mg via INTRAVENOUS

## 2015-04-29 MED ORDER — SODIUM CHLORIDE 0.9 % IJ SOLN
INTRAMUSCULAR | Status: AC
  Filled 2015-04-29: qty 10

## 2015-04-29 SURGICAL SUPPLY — 46 items
BANDAGE ADH SHEER 1  50/CT (GAUZE/BANDAGES/DRESSINGS) ×6 IMPLANT
BLADE SURG 15 STRL LF DISP TIS (BLADE) ×1 IMPLANT
BLADE SURG 15 STRL SS (BLADE) ×2
CEMENT BONE KYPHX HV R (Orthopedic Implant) ×3 IMPLANT
COVER MAYO STAND STRL (DRAPES) ×3 IMPLANT
COVER SURGICAL LIGHT HANDLE (MISCELLANEOUS) ×3 IMPLANT
CURETTE EXPRESS SZ2 7MM (INSTRUMENTS) ×1 IMPLANT
CURETTE WEDGE 8.5MM KYPHX (MISCELLANEOUS) IMPLANT
CURRETTE EXPRESS SZ2 7MM (INSTRUMENTS) ×3
DERMABOND ADVANCED (GAUZE/BANDAGES/DRESSINGS) ×2
DERMABOND ADVANCED .7 DNX12 (GAUZE/BANDAGES/DRESSINGS) ×1 IMPLANT
DRAPE C-ARM 42X72 X-RAY (DRAPES) ×3 IMPLANT
DRAPE INCISE IOBAN 66X45 STRL (DRAPES) ×3 IMPLANT
DRAPE LAPAROTOMY T 102X78X121 (DRAPES) ×3 IMPLANT
DRAPE PROXIMA HALF (DRAPES) ×6 IMPLANT
DRAPE SURG 17X23 STRL (DRAPES) ×12 IMPLANT
DURAPREP 26ML APPLICATOR (WOUND CARE) ×3 IMPLANT
GAUZE SPONGE 4X4 16PLY XRAY LF (GAUZE/BANDAGES/DRESSINGS) ×3 IMPLANT
GLOVE BIO SURGEON STRL SZ7 (GLOVE) ×6 IMPLANT
GLOVE BIO SURGEON STRL SZ8 (GLOVE) ×6 IMPLANT
GLOVE BIOGEL PI IND STRL 7.0 (GLOVE) ×2 IMPLANT
GLOVE BIOGEL PI IND STRL 7.5 (GLOVE) ×1 IMPLANT
GLOVE BIOGEL PI IND STRL 8 (GLOVE) ×1 IMPLANT
GLOVE BIOGEL PI INDICATOR 7.0 (GLOVE) ×4
GLOVE BIOGEL PI INDICATOR 7.5 (GLOVE) ×2
GLOVE BIOGEL PI INDICATOR 8 (GLOVE) ×2
GOWN STRL REUS W/ TWL LRG LVL3 (GOWN DISPOSABLE) ×2 IMPLANT
GOWN STRL REUS W/ TWL XL LVL3 (GOWN DISPOSABLE) ×1 IMPLANT
GOWN STRL REUS W/TWL LRG LVL3 (GOWN DISPOSABLE) ×4
GOWN STRL REUS W/TWL XL LVL3 (GOWN DISPOSABLE) ×2
KIT BASIN OR (CUSTOM PROCEDURE TRAY) ×3 IMPLANT
NEEDLE 22X1 1/2 (OR ONLY) (NEEDLE) IMPLANT
NEEDLE ASPIRATION RAN815N 8X15 (NEEDLE) ×1 IMPLANT
NEEDLE ASPIRATION RANFAC 8X15 (NEEDLE) ×2
NEEDLE HYPO 25X1 1.5 SAFETY (NEEDLE) IMPLANT
NEEDLE SPNL 18GX3.5 QUINCKE PK (NEEDLE) ×6 IMPLANT
NS IRRIG 1000ML POUR BTL (IV SOLUTION) ×3 IMPLANT
PACK SURGICAL SETUP 50X90 (CUSTOM PROCEDURE TRAY) ×3 IMPLANT
PAD ARMBOARD 7.5X6 YLW CONV (MISCELLANEOUS) ×6 IMPLANT
POSITIONER HEAD PRONE TRACH (MISCELLANEOUS) ×3 IMPLANT
SUT MNCRL AB 4-0 PS2 18 (SUTURE) ×3 IMPLANT
SYR BULB IRRIGATION 50ML (SYRINGE) ×3 IMPLANT
SYR CONTROL 10ML LL (SYRINGE) ×3 IMPLANT
TOWEL OR 17X24 6PK STRL BLUE (TOWEL DISPOSABLE) ×3 IMPLANT
TOWEL OR 17X26 10 PK STRL BLUE (TOWEL DISPOSABLE) ×3 IMPLANT
TRAY KYPHOPAK 15/3 ONESTEP 1ST (MISCELLANEOUS) ×3 IMPLANT

## 2015-04-29 NOTE — Anesthesia Preprocedure Evaluation (Addendum)
Anesthesia Evaluation  Patient identified by MRN, date of birth, ID band Patient awake    Reviewed: Allergy & Precautions, H&P , NPO status , Patient's Chart, lab work & pertinent test results  Airway Mallampati: II  TM Distance: >3 FB Neck ROM: Full    Dental no notable dental hx. (+) Teeth Intact, Dental Advisory Given   Pulmonary neg pulmonary ROS,    Pulmonary exam normal breath sounds clear to auscultation       Cardiovascular negative cardio ROS   Rhythm:Regular Rate:Normal     Neuro/Psych  Headaches, negative psych ROS   GI/Hepatic negative GI ROS, Neg liver ROS,   Endo/Other  negative endocrine ROS  Renal/GU Renal disease  negative genitourinary   Musculoskeletal   Abdominal   Peds  Hematology negative hematology ROS (+)   Anesthesia Other Findings   Reproductive/Obstetrics negative OB ROS                            Anesthesia Physical Anesthesia Plan  ASA: II  Anesthesia Plan: General   Post-op Pain Management:    Induction: Intravenous  Airway Management Planned: Oral ETT  Additional Equipment:   Intra-op Plan:   Post-operative Plan: Extubation in OR  Informed Consent: I have reviewed the patients History and Physical, chart, labs and discussed the procedure including the risks, benefits and alternatives for the proposed anesthesia with the patient or authorized representative who has indicated his/her understanding and acceptance.   Dental advisory given  Plan Discussed with: CRNA  Anesthesia Plan Comments:         Anesthesia Quick Evaluation

## 2015-04-29 NOTE — Anesthesia Procedure Notes (Signed)
Procedure Name: Intubation Date/Time: 04/29/2015 10:40 AM Performed by: Marni Griffon Pre-anesthesia Checklist: Patient identified, Emergency Drugs available, Suction available and Patient being monitored Patient Re-evaluated:Patient Re-evaluated prior to inductionOxygen Delivery Method: Circle system utilized Preoxygenation: Pre-oxygenation with 100% oxygen Intubation Type: IV induction Ventilation: Mask ventilation without difficulty Laryngoscope Size: Mac and 3 Grade View: Grade I Tube type: Oral Tube size: 7.5 mm Number of attempts: 1 Airway Equipment and Method: Stylet Placement Confirmation: ETT inserted through vocal cords under direct vision,  positive ETCO2 and breath sounds checked- equal and bilateral Secured at: 21 (cm at teeth) cm Tube secured with: Tape Dental Injury: Teeth and Oropharynx as per pre-operative assessment

## 2015-04-29 NOTE — Transfer of Care (Signed)
Immediate Anesthesia Transfer of Care Note  Patient: Paige Klein  Procedure(s) Performed: Procedure(s) with comments: KYPHOPLASTY (N/A) - Lumbar 1 kyphoplasty  Patient Location: PACU  Anesthesia Type:General  Level of Consciousness: awake, alert , oriented and patient cooperative  Airway & Oxygen Therapy: Patient Spontanous Breathing and Patient connected to nasal cannula oxygen  Post-op Assessment: Report given to RN, Post -op Vital signs reviewed and stable and Patient moving all extremities  Post vital signs: Reviewed and stable  Last Vitals:  Filed Vitals:   04/29/15 0822  BP: 126/37  Pulse: 45  Temp: 36.8 C  Resp: 18    Complications: No apparent anesthesia complications

## 2015-04-29 NOTE — Anesthesia Postprocedure Evaluation (Signed)
Anesthesia Post Note  Patient: Paige Klein  Procedure(s) Performed: Procedure(s) (LRB): KYPHOPLASTY (N/A)  Patient location during evaluation: PACU Anesthesia Type: General Level of consciousness: awake and alert Pain management: pain level controlled Vital Signs Assessment: post-procedure vital signs reviewed and stable Respiratory status: spontaneous breathing, nonlabored ventilation and respiratory function stable Cardiovascular status: blood pressure returned to baseline and stable Postop Assessment: no signs of nausea or vomiting Anesthetic complications: no    Last Vitals:  Filed Vitals:   04/29/15 1306 04/29/15 1315  BP: 134/63 100/59  Pulse: 58 55  Temp: 36.7 C   Resp: 16 16    Last Pain:  Filed Vitals:   04/29/15 1317  PainSc: 0-No pain                 Avia Merkley,W. EDMOND

## 2015-04-30 ENCOUNTER — Encounter (HOSPITAL_COMMUNITY): Payer: Self-pay | Admitting: Orthopedic Surgery

## 2015-04-30 NOTE — Op Note (Signed)
NAMEMarland Kitchen  YANNA, LEAKS         ACCOUNT NO.:  [id number]  MEDICAL RECORD NO.:  [id number]  LOCATION:                               FACILITY:  [facility]  PHYSICIAN:  Estill Bamberg, MD      DATE OF BIRTH:  [birth year]  DATE OF PROCEDURE:  04/29/2015 DATE OF DISCHARGE:  04/29/2015                              OPERATIVE REPORT   PREOPERATIVE DIAGNOSIS:  L1 compression fracture.  POSTOPERATIVE DIAGNOSIS:  L1 compression fracture.  PROCEDURE:  L1 kyphoplasty using a bipedicular approach.  SURGEON:  Estill Bamberg, MD  ASSISTANT:  Jason Coop, PA-C  ANESTHESIA:  General endotracheal anesthesia.  COMPLICATIONS:  None.  DISPOSITION:  Stable.  ESTIMATED BLOOD LOSS:  Minimal.  INDICATIONS FOR SURGERY:  Briefly, Ms. Boline is a pleasant 65 year old female, who did present to me on April 09, 2015, with ongoing pain in the low back.  Of note, she is status post a fall in November.  She did have ongoing pain.  The pain was improving, but it did continue along with her function.  We therefore did discuss the risks and benefits and recovery associated with an L1 kyphoplasty.  The patient did understand the risks and limitations of the procedure and did elect to proceed.  OPERATIVE DETAILS:  On April 29, 2015, patient was brought to surgery and general endotracheal anesthesia was administered.  The patient was placed prone on a well-padded flat Jackson bed.  Gell rolls were placed under the patient's chest and hips.  Antibiotics were given.  I then brought in AP and lateral fluoroscopy.  I then made two small stab incisions at the superior and lateral aspect of the L1 pedicles.  I then advanced the obturators across the S1 pedicles on the right and left sides.  The obturators were advanced to a level just anterior to the posterior border of the L1 vertebral body.  I then drilled and curetted through the cannulas.  I then introduced kyphoplasty balloons.  The balloons were  inflated with approximately 0.5 mL of contrast.  There was very minimal restoration of the superior endplate noted.  At this point, I did introduce a total of approximately 3 mL of bones of cement, approximately half through each cannula on the right and left sides.  Of note, there was excellent interdigitation of the cement noted.  There was no abnormal extravasation of cement posteriorly into the spinal canal or anteriorly or into the intervertebral space superiorly or inferiorly.  The cement was then allowed to harden.  The wound was then irrigated.  The wound was then closed using 3-0 Monocryl.  Benzoin and Steri-Strips were applied followed by sterile dressing.  All instrument counts were correct at the termination of the procedure.     Estill Bamberg, MD     MD/MEDQ  D:  04/29/2015  T:  04/30/2015  Job:  [id number]  cc:   Lavonda Jumbo, M.D.

## 2015-05-03 ENCOUNTER — Telehealth: Payer: Self-pay | Admitting: *Deleted

## 2015-05-03 NOTE — Telephone Encounter (Signed)
-----   Message from [name], MD sent at 04/29/2015  8:41 AM EST ----- Regarding: osteoporosis I see that she now has a compression fracture at L1, and is scheduled for kyphoplasty.  Given this new event, and her known osteoporosis, I think it would be wiser to start Prolia now, rather than waiting a year for another bone density test.  Have her schedule appointment to discuss if she has questions.  If she has no questions and would like to proceed, we can arrange. Rep that was here yesterday Claris Che) said we order through central pharmacy, and administer the injection every 6 months year (and that Lafonda Mosses is aware of how to get med).  She also mentioned a portal that could tell how much pt would have to pay, straight Medicare would be completely covered, per Claris Che.  I think she is having procedure today/soon, so this can wait until next week (and you can copy/paste some of this into a phone encounter).  Thanks!

## 2015-05-03 NOTE — Telephone Encounter (Signed)
Patient is going to call me back in a few days.

## 2015-05-24 ENCOUNTER — Other Ambulatory Visit (INDEPENDENT_AMBULATORY_CARE_PROVIDER_SITE_OTHER): Payer: Medicare Other

## 2015-05-24 DIAGNOSIS — M810 Age-related osteoporosis without current pathological fracture: Secondary | ICD-10-CM

## 2015-05-24 MED ORDER — DENOSUMAB 60 MG/ML ~~LOC~~ SOLN
60.0000 mg | Freq: Once | SUBCUTANEOUS | Status: AC
  Administered 2015-05-24: 60 mg via SUBCUTANEOUS

## 2015-05-27 LAB — HM MAMMOGRAPHY: HM Mammogram: NORMAL

## 2015-06-03 ENCOUNTER — Encounter: Payer: Self-pay | Admitting: *Deleted

## 2015-08-23 ENCOUNTER — Other Ambulatory Visit: Payer: Self-pay | Admitting: *Deleted

## 2015-08-23 ENCOUNTER — Telehealth: Payer: Self-pay | Admitting: *Deleted

## 2015-08-23 DIAGNOSIS — Z780 Asymptomatic menopausal state: Secondary | ICD-10-CM

## 2015-08-23 MED ORDER — AMBULATORY NON FORMULARY MEDICATION: 1.0000 mL | Freq: Two times a day (BID) | Status: DC

## 2015-08-23 NOTE — Telephone Encounter (Signed)
Ok to refill to last x 6 months

## 2015-08-23 NOTE — Telephone Encounter (Signed)
Fax came over from [facility] for patient's compounded progest/testosterone cream-am I okay to refill for 6 months?

## 2015-12-07 ENCOUNTER — Telehealth: Payer: Self-pay | Admitting: Family Medicine

## 2015-12-07 ENCOUNTER — Telehealth: Payer: Self-pay | Admitting: *Deleted

## 2015-12-07 NOTE — Telephone Encounter (Signed)
I didn't receive a automatic reverification from [employer] regarding coverage of pt's Prolia.  Called [employer]  t#  [phone] & they will send reverification to insurance company & will fax us back the information.   Also need to verify with Dr. Lynelle DoctorKnapp if we are continuing this medication ? Please advise

## 2015-12-07 NOTE — Telephone Encounter (Signed)
Check to see if patient has any claudication symptoms (any pain or discomfort with exercise/exertion that is relieved by rest, in her legs).  I suspect the screen was an ABI--those values are abnormal.  Recommend arterial US of BLE's

## 2015-12-07 NOTE — Telephone Encounter (Signed)
[facility] and was following up to let you know that PAD screening left foot 0.24 severe and right foot 0.46 significant, please follow up with patient.

## 2015-12-07 NOTE — Telephone Encounter (Signed)
Left message for patient to return my call.

## 2015-12-07 NOTE — Telephone Encounter (Signed)
She is past due for this--due earlier this month.  As long as patient didn't report any problems, and desires to continue with the injection, this only needs to be verified with me once a year (that she has been seen for a wellness visit and med is being continued should be documented in that visit--that is good enough)

## 2015-12-08 NOTE — Telephone Encounter (Signed)
Spoke with patient and she is not having any claudication symptoms-do you still want u/s scheduled? She did want me to tell you that she is having some strange vibration feeling at her hips, neck and chest that lasts for about 20 seconds every so often for the last month or so...offered her an appt.-she said she would rather wait until her CPE in Dec that she isn't really worried about but just wanted you to know. Said I would tell you but encouraged her to schedule appt if it worsened-she said she Googled it and was worried about Parkinson's (FYI).

## 2015-12-08 NOTE — Telephone Encounter (Signed)
If she is asymptomatic re: leg pain with exercise, she can wait and we can address this at her physical.  Have her bring the paperwork from [employer] to her visit (or send here in advance so we have it, so she doesn't forget).  Her other concerns can wait if that's what she wants to do.

## 2015-12-08 NOTE — Telephone Encounter (Signed)
Patient advised.

## 2015-12-22 NOTE — Telephone Encounter (Signed)
Recv'd reverification and Prolia is covered cost is $50 for syring & $20 admin fee.  Called pt & she states she had no problems with first injection and wants to continue.  Advised will order and call her once received

## 2015-12-22 NOTE — Telephone Encounter (Signed)
Called [employer] again, due to no response & they stated they have verified ins and will refax this info.

## 2015-12-24 NOTE — Telephone Encounter (Signed)
Prolia received, called pt & scheduled lab visit

## 2015-12-27 ENCOUNTER — Other Ambulatory Visit (INDEPENDENT_AMBULATORY_CARE_PROVIDER_SITE_OTHER): Payer: Medicare Other

## 2015-12-27 DIAGNOSIS — M810 Age-related osteoporosis without current pathological fracture: Secondary | ICD-10-CM

## 2015-12-27 MED ORDER — DENOSUMAB 60 MG/ML ~~LOC~~ SOLN
60.0000 mg | Freq: Once | SUBCUTANEOUS | Status: AC
  Administered 2015-12-27: 60 mg via SUBCUTANEOUS

## 2016-03-07 NOTE — Progress Notes (Signed)
Chief Complaint  Patient presents with  . Medicare Wellness    fasting AWV/CPE with pap. Did not do eye exam just had one with Dr Lottie [facility]Bond at [facility]. She mentions that she wakes up with numb hands. Digestion issues.     Paige Klein is a 65 y.o. female who presents for annual physical, Medicare wellness visit and follow-up on chronic medical conditions.  She has the following concerns:  Insurance did a HouseCall screening--abnormal ABI noted, 0.46 on right, 0.24 on left.  She will walk 1.5 miles on the treadmill without any problems.  Denies claudication.  Osteoporosis: She has been off fosamax since 11/2011 (since reading about adverse effects). She took Actonel x 6 years, subsequently changed to Fosamax (insurance reasons). She thinks she had been on medications for about 10 years. (For many years she took med with well water, and it was felt it wasn't being absorbed, bone density continued to decline. Stabilized since changing to taking it with distilled water). She says since stopping Fosamax, she feels better, bone pains/aches are better, and left hip pain has completely resolved. (She also quit working, and no longer has stairs at home). She noticed stiffness in her yoga class, even after not working, while still taking the fosamax. Her last DEXA showed no significant change (still showed osteoporosis, no decline from study 2 years prior). She was started on Prolia injections in March, and had her second injection in October. She denies any side effects.  She has noticed some more skin issues/dermatitis, but very mild. (short-term itchy spots x 3 times).  Postmenopausal with atrophic vaginitis, uses compounded progesterone (bi-est (6:4)+progest+test 0.3mg +2%+0.025mg  1cc topically)--she admits to only taking it once daily (rather than the twice daily), and feels like it helps at this dose. She feels "calmer" with it, and feels more stressed/anxious without it.  A year ago she reported  that every morning over the last year she has very soft stools or diarrhea. This occured when she first wakes up, not after eating. She has had celiac testing in the past (with routine colonoscopy, due to uncle with celiac disease), but she reported she wasn't having any diarrhea at that time. Studies were repeated last year (bloodwork), and were normal.  She tried probiotics (as recommended) and this improved.  She no longer has loose stools, sometimes has constipation (mild).  She now only uses probiotic sporadically.   Immunization History  Administered Date(s) Administered  . Influenza Split 03/21/2007, 03/20/2008, 01/19/2011  . Influenza, High Dose Seasonal PF 03/04/2015  . Influenza, Seasonal, Injecte, Preservative Fre 02/21/2012  . Influenza,inj,Quad PF,36+ Mos 02/24/2013, 02/25/2014  . PPD Test 02/11/1997  . Pneumococcal Conjugate-13 03/04/2015  . Td 10/04/1990, 03/18/2001  . Tdap 04/13/2009  . Zoster 08/20/2012   Last Pap smear: 02/2013--normal with no high risk HPV Last mammogram: 05/2015 Last colonoscopy: 07/2011 Dr. Loreta AveMann Last DEXA:  04/2014: T-2.7, no statistically significant change.  S/P treatment with fosamax/actonel for 10 years; off bisphosphonates since 11/2011. Prolia started 05/2015 Ophtho: every 6 months (glaucoma) Dentist: regularly, every 6 months Exercise: yoga once or twice a week; treadmill once a week (30 minutes plus weights) at the [facility] (not recently due to foot sprain).  Vitamin D level was normal twice in the past on current vitamins (last was 34 in 02/2014). Lipids: Lab Results  Component Value Date   CHOL 165 03/04/2015   HDL 55 03/04/2015   LDLCALC 93 03/04/2015   TRIG 84 03/04/2015   CHOLHDL 3.0 03/04/2015  Other doctors caring for patient include: GI: Dr. Loreta Ave Ophtho: Dr. Emily Filbert (in the past, now seeing specialist) [facility] specialist at [facility]: Dr. Lottie Dawson Dentist: Dr. Renard Hamper (now seeing his pratner) GYN: Dr. Chevis Pretty   Depression screen: Negative Fall  screen: 2 falls: slipped on the ice 2 weeks ago, sprained foot. Tripped on a rock in her yard while walking backwards winding up her garden hose.  No significant injury. Functional status screen--notable for vision issues (glaucoma, sees ophtho), occasional urge/stress incontinence.  See screens in epic.  End of Life Discussion:  Patient has a living will and medical power of attorney  Past Medical History:  Diagnosis Date  . Abnormal Pap smear of cervix 1999   ASGUS x 1  . Allergic rhinitis, cause unspecified    mold; and vasomotor rhinitis  . Cataract   . Fibrocystic breast changes    and also breast calcifications (08/2008)  . Glaucoma   . Glaucoma    followed by [facility] Dr. Lottie Dawson  . Glaucoma suspect of both eyes    high-normal intraocular pressures (Dr. Emily Filbert)  . H/O bone density study 09/07/09   08/16/07, 08/08/05; osteoporosis  . H/O mammogram 11/23/10   normal  . Hypercholesteremia    borderline per NMR lipoprofile 2008, 2007  . Internal hemorrhoids   . Kidney stone 1986   right  . Migraine headache    resolved with menopause (menstrual migraines)  . MVP (mitral valve prolapse)    per echo  . Osteoporosis   . Pap smear for cervical cancer screening 04/14/09   negative for malignancy, +atrophic changes  . Postmenopausal    on compounded cream daily  . Unspecified vitamin D deficiency 2009    Past Surgical History:  Procedure Laterality Date  . BREAST CYST ASPIRATION     many (Dr. Maryagnes Amos)  . BREAST FIBROADENOMA SURGERY  09-03   Dr.Gooden  . cardiolite (stress test)  11/05   Dr. Ermalinda Memos  . cataract surgery  2010   bilateral  . CERVICAL POLYPECTOMY  2003   treated with cryosurgery  . COLONOSCOPY  08/02/11; 05/2000   Dr. Loreta Ave - rectal polyps 2013  . DILATION AND CURETTAGE OF UTERUS  05/2009   uterine polyp, bleeding  . endocervical cyst  12/96, 6/97   Dr Chevis Pretty  . EYE SURGERY    . KYPHOPLASTY N/A 04/29/2015   Procedure: KYPHOPLASTY;  Surgeon: Estill Bamberg, MD;  Location:  [facility] OR;  Service: Orthopedics;  Laterality: N/A;  Lumbar 1 kyphoplasty    Social History   Social History  . Marital status: Divorced    Spouse name: N/A  . Number of children: 0  . Years of education: N/A   Occupational History  . 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 Smoking status: Never Smoker  . Smokeless tobacco: Never Used  . Alcohol use 0.0 oz/week     Comment: 2-3 drinks once a week, social  . Drug use: No  . Sexual activity: Not Currently    Partners: Male     Comment: has boyfriend, not currently sexually active   Other Topics Concern  . Not on file   Social History Narrative   Divorced, no children, exercise with yoga, goes to the [employer], school psychologist - retired.   Lives alone, 1 cat    Family History  Problem Relation Age of Onset  . Hypertension Mother   . Arthritis Mother   . Cancer Mother  breast cancer in her 70's and 80's (bilateral)  . Hypertension Father   . Other Father     died of pancreatitis  . Muscular dystrophy Maternal Aunt     adult onset  . Cancer Cousin     mouth (nonsmoker)  . Celiac disease Maternal Uncle   . Cancer Maternal Grandmother     cervical and ovarian  . Cancer Maternal Grandfather     pancreatic  . Diabetes Maternal Aunt     Outpatient Encounter Prescriptions as of 03/09/2016  Medication Sig  . AMBULATORY NON FORMULARY MEDICATION Apply 1 mL topically 2 (two) times daily. BI-EST(6:4)+PROGEST+TEST 0.3MG  = 2% + 0.025MG  CREAM. 60 QUANTITY.  Marland Kitchen. apraclonidine (IOPIDINE) 0.5 % ophthalmic solution Place 1 drop into both eyes 2 (two) times daily.  . dorzolamide (TRUSOPT) 2 % ophthalmic solution PLACE 1 DROP INTO both EYE(S) THREE TIMES A DAY  . latanoprost (XALATAN) 0.005 % ophthalmic solution Place 1 drop into both eyes at bedtime.    . LUTEIN PO Take 1 capsule by mouth daily.   . Multiple Minerals-Vitamins (CITRACAL PLUS) TABS Take 3 tablets by mouth daily.    . Multiple Vitamins-Minerals (CENTRUM SILVER PO) Take 1 tablet by mouth daily.    Marland Kitchen. loratadine (CLARITIN) 10 MG tablet Take 10 mg by mouth daily as needed for allergies.   No facility-administered encounter medications on file as of 03/09/2016.     Allergies  Allergen Reactions  . Brinzolamide-Brimonidine Itching  . Cortisone Other (See Comments)    Entire body hurt from a cortisone shot.  . Fosamax [Alendronate Sodium]     GI upset, bone pain; tolerated Actonel  . Timolol Other (See Comments)    Slowed her heart rate down  . Tramadol Rash    ROS: The patient denies anorexia, fever, weight changes, headaches,  decreased hearing, ear pain, sore throat, breast concerns, chest pain, palpitations, dizziness, syncope, dyspnea on exertion, cough, swelling (just slight in the left foot she recently sprained), nausea, vomiting, abdominal pain, diarrhea, melena, hematochezia, indigestion/heartburn (rare), hematuria,dysuria, postmenopausal bleeding, vaginal discharge, odor or itch, genital lesions, weakness, tremor, suspicious skin lesions, depression, anxiety, abnormal bleeding/bruising, or enlarged lymph nodes. Some urinary urgency, only rare leakage (on the way to the bathroom); slight urgency/incontinence when she has colds/cough. Wakes up with numbness in her hands, intermittently (4th and 5th fingers on the left, and first 3 fingers on the right), usually one or the other. Does sometimes sleep on her stomach. Occasionally stuffy nose and frontal headaches, intermittent. Occasional constipation Intermittent mild skin reactions (itchy spots, short-lived, none currently). Some early awakening and trouble getting back to sleep when very busy/stressed. +vision changes--visual field loss from glaucoma; no longer driving at night), Frequent tick bites--never had any reactions, no rashes, joint pains, symptoms. Asking about screening tests   PHYSICAL EXAM:  BP 138/70 (BP Location: Left Arm,  Patient Position: Sitting, Cuff Size: Normal)   Pulse 60   Ht 5\' 7"  (1.702 m)   Wt 138 lb 12.8 oz (63 kg)   LMP 03/20/2000   BMI 21.74 kg/m   General Appearance:  Alert, cooperative, no distress, appears her age  Head:  Normocephalic, without obvious abnormality, atraumatic   Eyes:  PERRL, conjunctiva is diffusely mildly injected R>L; EOM's intact; fundi without hemorrhage or exudate  Ears:  Normal TM's and external ear canals   Nose:  Nares normal, mucosa is mildly edematous, no erythema, no drainage or sinus tenderness.  Throat:  Lips, mucosa, and tongue normal; teeth and  gums normal   Neck:  Supple, no lymphadenopathy; thyroid: no enlargement/tenderness/nodules; no carotid bruit or JVD   Back:  Spine nontender, no curvature, ROM normal, no CVA tenderness   Lungs:  Clear to auscultation bilaterally without wheezes, rales or rhonchi; respirations unlabored   Chest Wall:  No tenderness or deformity   Heart:  Regular  Rhythm, bradycardic (56-60);  S1 and S2 normal, no murmur, rub or gallop.   Breast Exam:  No tenderness, masses, or nipple discharge or inversion. No axillary lymphadenopathy   Abdomen:  Soft, non-tender, nondistended, normoactive bowel sounds,  no masses, no hepatosplenomegaly   Genitalia:  Normal external genitalia without lesions. Mild atrophic changes. No cervical motion tenderness. Uterus and adnexa not enlarged, nontender, no masses. Pap not performed   Rectal:  Normal tone, no masses or tenderness; guaiac negative stool   Extremities:  No clubbing, cyanosis or edema.    Pulses:  2+ dorsalis pedis and right posterior tibialis, 1+ left posterior tib  Skin:  Skin color, texture, turgor normal. scattered benign lesions.  Lymph nodes:  Cervical, supraclavicular, and axillary nodes normal   Neurologic:  CNII-XII intact, normal strength, sensation and gait; reflexes 2+ and symmetric throughout     Psych:   Normal mood, affect, hygiene and grooming   ASSESSMENT/PLAN:  Annual physical exam - Plan: Comprehensive metabolic panel, POCT Urinalysis Dipstick  Medicare annual wellness visit, initial  Osteoporosis without current pathological fracture, unspecified osteoporosis type - continue Prolia injections q6 mos; continue Ca, Vit D, weight bearing exercise  Vitamin D deficiency - continue supplements  Immunization due - Plan: Pneumococcal polysaccharide vaccine 23-valent greater than or equal to 2yo subcutaneous/IM, Flu vaccine HIGH DOSE PF (Fluzone High dose)  Medication monitoring encounter - Plan: Comprehensive metabolic panel  Abnormal ankle brachial index (ABI) - per [employer] Best boy).  I do not suspect as severe as noted given minimal decrease in pulses; check arterial US - Plan: VAS [place] LOWER EXTREMITY ARTERIAL DUPLEX  Diminished pulses in lower extremity - Plan: VAS [place] ABI WITH/WO TBI   Discussed monthly self breast exams and yearly mammograms; at least 30 minutes of aerobic activity at least 5 days/week, weight-bearing exercise 2x/week; proper sunscreen use reviewed; healthy diet, including goals of calcium and vitamin D intake and alcohol recommendations (less than or equal to 1 drink/day) reviewed; regular seatbelt use; changing batteries in smoke detectors. Immunization recommendations discussed--pneumovax and high dose flu shot given today.Risks/side effects reviewed. Shingrix recommended when available. Colonoscopy recommendations reviewed--UTD  Osteoporosis and vitamin D deficiency: continue calcium and vitamin D as recommended. dexa due 04/2016--rec getting with her next mammo, due in March ([facility] to fax order)  Prolia in 06/2016.  Postmenopausal - c/t compounded HRT.  Full Code, Full Care, no changes (MOST form not signed today)   Medicare Attestation I have personally reviewed: The patient's medical and social history Their  use of alcohol, tobacco or illicit drugs Their current medications and supplements The patient's functional ability including ADLs,fall risks, home safety risks, cognitive, and hearing and visual impairment Diet and physical activities Evidence for depression or mood disorders  The patient's weight, height, and BMI have been recorded in the chart.  I have made referrals, counseling, and provided education to the patient based on review of the above and I have provided the patient with a written personalized care plan for preventive services.     Breawna Montenegro A, MD   03/07/2016

## 2016-03-09 ENCOUNTER — Encounter: Payer: Self-pay | Admitting: Family Medicine

## 2016-03-09 ENCOUNTER — Ambulatory Visit (INDEPENDENT_AMBULATORY_CARE_PROVIDER_SITE_OTHER): Payer: Medicare Other | Admitting: Family Medicine

## 2016-03-09 VITALS — BP 138/70 | HR 60 | Ht 67.0 in | Wt 138.8 lb

## 2016-03-09 DIAGNOSIS — E559 Vitamin D deficiency, unspecified: Secondary | ICD-10-CM | POA: Diagnosis not present

## 2016-03-09 DIAGNOSIS — R0989 Other specified symptoms and signs involving the circulatory and respiratory systems: Secondary | ICD-10-CM

## 2016-03-09 DIAGNOSIS — Z23 Encounter for immunization: Secondary | ICD-10-CM

## 2016-03-09 DIAGNOSIS — Z0000 Encounter for general adult medical examination without abnormal findings: Secondary | ICD-10-CM

## 2016-03-09 DIAGNOSIS — M810 Age-related osteoporosis without current pathological fracture: Secondary | ICD-10-CM | POA: Diagnosis not present

## 2016-03-09 DIAGNOSIS — Z5181 Encounter for therapeutic drug level monitoring: Secondary | ICD-10-CM

## 2016-03-09 DIAGNOSIS — R6889 Other general symptoms and signs: Secondary | ICD-10-CM | POA: Diagnosis not present

## 2016-03-09 LAB — POCT URINALYSIS DIPSTICK
Bilirubin, UA: NEGATIVE
Blood, UA: NEGATIVE
Glucose, UA: NEGATIVE
Ketones, UA: NEGATIVE
Leukocytes, UA: NEGATIVE
Nitrite, UA: NEGATIVE
Protein, UA: NEGATIVE
Spec Grav, UA: 1.015
Urobilinogen, UA: NEGATIVE
pH, UA: 8

## 2016-03-09 LAB — COMPREHENSIVE METABOLIC PANEL
ALK PHOS: 41 U/L (ref 33–130)
ALT: 26 U/L (ref 6–29)
AST: 30 U/L (ref 10–35)
Albumin: 4.6 g/dL (ref 3.6–5.1)
BILIRUBIN TOTAL: 0.4 mg/dL (ref 0.2–1.2)
BUN: 20 mg/dL (ref 7–25)
CO2: 22 mmol/L (ref 20–31)
CREATININE: 0.92 mg/dL (ref 0.50–0.99)
Calcium: 9.2 mg/dL (ref 8.6–10.4)
Chloride: 106 mmol/L (ref 98–110)
Glucose, Bld: 76 mg/dL (ref 65–99)
Potassium: 4.2 mmol/L (ref 3.5–5.3)
SODIUM: 139 mmol/L (ref 135–146)
TOTAL PROTEIN: 7.6 g/dL (ref 6.1–8.1)

## 2016-03-09 NOTE — Patient Instructions (Signed)
HEALTH MAINTENANCE RECOMMENDATIONS:  It is recommended that you get at least 30 minutes of aerobic exercise at least 5 days/week (for weight loss, you may need as much as 60-90 minutes). This can be any activity that gets your heart rate up. This can be divided in 10-15 minute intervals if needed, but try and build up your endurance at least once a week.  Weight bearing exercise is also recommended twice weekly.  Eat a healthy diet with lots of vegetables, fruits and fiber.  "Colorful" foods have a lot of vitamins (ie green vegetables, tomatoes, red peppers, etc).  Limit sweet tea, regular sodas and alcoholic beverages, all of which has a lot of calories and sugar.  Up to 1 alcoholic drink daily may be beneficial for women (unless trying to lose weight, watch sugars).  Drink a lot of water.  Calcium recommendations are 1200-1500 mg daily (1500 mg for postmenopausal women or women without ovaries), and vitamin D 1000 IU daily.  This should be obtained from diet and/or supplements (vitamins), and calcium should not be taken all at once, but in divided doses.  Monthly self breast exams and yearly mammograms for women over the age of 66 is recommended.  Sunscreen of at least SPF 30 should be used on all sun-exposed parts of the skin when outside between the hours of 10 am and 4 pm (not just when at beach or pool, but even with exercise, golf, tennis, and yard work!)  Use a sunscreen that says "broad spectrum" so it covers both UVA and UVB rays, and make sure to reapply every 1-2 hours.  Remember to change the batteries in your smoke detectors when changing your clock times in the spring and fall.  Use your seat belt every time you are in a car, and please drive safely and not be distracted with cell phones and texting while driving.   Ms. Paige Klein , Thank you for taking time to come for your Medicare Wellness Visit. I appreciate your ongoing commitment to your health goals. Please review the  following plan we discussed and let me know if I can assist you in the future.   These are the goals we discussed: Goals    None      This is a list of the screening recommended for you and due dates:  Health Maintenance  Topic Date Due  . Flu Shot  10/19/2015  . Pneumonia vaccines (2 of 2 - PPSV23) 03/03/2016  . Mammogram  05/26/2017  . Tetanus Vaccine  04/14/2019  . Colon Cancer Screening  08/01/2021  . DEXA scan (bone density measurement)  Completed  . Shingles Vaccine  Completed  .  Hepatitis C: One time screening is recommended by Center for Disease Control  (CDC) for  adults born from [birth year] through 1966.   Completed   Flu shot and second pneumonia vaccine was given today. Your next mammogram is due March 2018 (not 2019 as stated above). You are due for your next bone density test at the same time--please schedule these. The date above for colonoscopy is 10 years from the last--if you were told 5 years, then you are due 07/2016 (not 2023 as stated above). Contact Dr. Kenna GilbertMann's office if you don't hear from them in the spring.  I recommend getting the new shingles vaccine (Shingrix) when available. You will need to check with your insurance to see if it is covered, and if covered by Medicare Part D, you need to get from the pharmacy rather  than our office.  It is a series of 2 injections, spaced 2 months apart.

## 2016-03-16 ENCOUNTER — Encounter (HOSPITAL_COMMUNITY): Payer: Self-pay

## 2016-03-16 ENCOUNTER — Ambulatory Visit (HOSPITAL_COMMUNITY)
Admission: RE | Admit: 2016-03-16 | Discharge: 2016-03-16 | Disposition: A | Discharge status: Home / Self Care | Payer: Medicare Other | Source: Ambulatory Visit | Attending: Vascular Surgery | Admitting: Vascular Surgery

## 2016-03-16 DIAGNOSIS — R6889 Other general symptoms and signs: Secondary | ICD-10-CM

## 2016-03-16 DIAGNOSIS — R0989 Other specified symptoms and signs involving the circulatory and respiratory systems: Secondary | ICD-10-CM | POA: Diagnosis not present

## 2016-03-16 DIAGNOSIS — I10 Essential (primary) hypertension: Secondary | ICD-10-CM | POA: Diagnosis not present

## 2016-05-09 ENCOUNTER — Telehealth: Payer: Self-pay

## 2016-05-09 NOTE — Telephone Encounter (Signed)
Okay to refill x 1.  Due for mammo in March.  Once we see mammo, can refill for a year

## 2016-05-09 NOTE — Telephone Encounter (Signed)
Faxed request for pt Bi-est+progesterone+ testosterone 0.3mg +2%=0.025mg  cream to [facility].  [phone])[phone]

## 2016-05-10 ENCOUNTER — Other Ambulatory Visit: Payer: Self-pay | Admitting: *Deleted

## 2016-05-10 DIAGNOSIS — Z780 Asymptomatic menopausal state: Secondary | ICD-10-CM

## 2016-05-10 MED ORDER — AMBULATORY NON FORMULARY MEDICATION: 1.0000 mL | Freq: Two times a day (BID) | 0 refills | Status: DC

## 2016-05-10 NOTE — Telephone Encounter (Signed)
Phoned in x 1 month.

## 2016-05-31 LAB — HM MAMMOGRAPHY

## 2016-05-31 LAB — HM DEXA SCAN

## 2016-06-08 ENCOUNTER — Telehealth: Payer: Self-pay | Admitting: *Deleted

## 2016-06-08 NOTE — Telephone Encounter (Signed)
Called patient to give her DEXA results.

## 2016-06-09 ENCOUNTER — Encounter: Payer: Self-pay | Admitting: Family Medicine

## 2016-06-26 ENCOUNTER — Telehealth: Payer: Self-pay | Admitting: Family Medicine

## 2016-06-26 NOTE — Telephone Encounter (Signed)
Faxed benefits reverification

## 2016-07-04 ENCOUNTER — Telehealth: Payer: Self-pay | Admitting: Family Medicine

## 2016-07-04 NOTE — Telephone Encounter (Signed)
Pt called back and was informed info on Prolia injection. She scheduled an appt for 07/14/2016. Cost will be $70.00. Will order from [place].

## 2016-07-04 NOTE — Telephone Encounter (Signed)
Left message for pt to call. Prolia has been verified. Pt's out of pocket cost will be $70.00. Pt needs to be scheduled and medication needs to be ordered.

## 2016-07-05 NOTE — Telephone Encounter (Signed)
prolia ordered from [place] 07/05/2016.

## 2016-07-07 ENCOUNTER — Encounter: Payer: Self-pay | Admitting: Family Medicine

## 2016-07-07 NOTE — Telephone Encounter (Signed)
prolia received 07/06/2016 °

## 2016-07-14 ENCOUNTER — Other Ambulatory Visit (INDEPENDENT_AMBULATORY_CARE_PROVIDER_SITE_OTHER): Payer: Medicare Other

## 2016-07-14 DIAGNOSIS — M810 Age-related osteoporosis without current pathological fracture: Secondary | ICD-10-CM | POA: Diagnosis not present

## 2016-07-14 MED ORDER — DENOSUMAB 60 MG/ML ~~LOC~~ SOLN
60.0000 mg | Freq: Once | SUBCUTANEOUS | Status: AC
  Administered 2016-07-14: 60 mg via SUBCUTANEOUS

## 2016-07-25 ENCOUNTER — Telehealth: Payer: Self-pay

## 2016-07-25 DIAGNOSIS — Z780 Asymptomatic menopausal state: Secondary | ICD-10-CM

## 2016-07-25 NOTE — Telephone Encounter (Signed)
Ok to refill with an additional refill

## 2016-07-25 NOTE — Telephone Encounter (Signed)
Fax request for refill of bi-est (6:4)+progesterone+testosterone 0.3mg +2%+0.025mg  cream to [facility]. Phone [phone].  Trixie Rude/RLB

## 2016-07-26 MED ORDER — AMBULATORY NON FORMULARY MEDICATION: 1.0000 mL | Freq: Two times a day (BID) | 1 refills | Status: DC

## 2016-07-26 NOTE — Telephone Encounter (Signed)
Called out medication with 1 RF. Trixie Rude/RLB

## 2016-10-30 ENCOUNTER — Telehealth: Payer: Self-pay | Admitting: Family Medicine

## 2016-10-30 NOTE — Telephone Encounter (Signed)
She needs to verify with Dr. Loreta AveMann.  Last colonoscopy was 07/2011, I see that polyps were noted, but I can't seem to find any pathology report from those polyps.  If they were adenomatous polyps, then 5 year follow-up is recommended, in which case she is due.  Let me know if you find pathology, if none, request from Dr. Loreta AveMann. But patient should contact them directly to ask when due again.

## 2016-10-30 NOTE — Telephone Encounter (Signed)
Patient Paige Fleetingadvised-she will call and ask them to send us path as well.

## 2016-10-30 NOTE — Telephone Encounter (Signed)
Pt called and states that she got the [facility] call that she was due for a colonoscopy and she was wanted to see if she needed one, pt can be reached at [phone]

## 2016-11-07 ENCOUNTER — Encounter: Payer: Self-pay | Admitting: Family Medicine

## 2016-12-19 ENCOUNTER — Telehealth: Payer: Self-pay | Admitting: Family Medicine

## 2016-12-19 NOTE — Telephone Encounter (Signed)
Pt was called concerning prolia injection. Verification was submitted and summary of benefits was received. Pt's insurance will cover the injection. Pt's estimated cost will be $70.00 due at appt time. Pt has a appt scheduled for 01/15/2017.

## 2017-01-15 ENCOUNTER — Telehealth: Payer: Self-pay | Admitting: Family Medicine

## 2017-01-15 ENCOUNTER — Other Ambulatory Visit (INDEPENDENT_AMBULATORY_CARE_PROVIDER_SITE_OTHER): Payer: Medicare Other

## 2017-01-15 DIAGNOSIS — M810 Age-related osteoporosis without current pathological fracture: Secondary | ICD-10-CM | POA: Diagnosis not present

## 2017-01-15 DIAGNOSIS — Z23 Encounter for immunization: Secondary | ICD-10-CM

## 2017-01-15 MED ORDER — DENOSUMAB 60 MG/ML ~~LOC~~ SOLN
60.0000 mg | Freq: Once | SUBCUTANEOUS | Status: AC
  Administered 2017-01-15: 60 mg via SUBCUTANEOUS

## 2017-01-15 MED ORDER — DENOSUMAB 60 MG/ML ~~LOC~~ SOLN: 60.0000 mg | Freq: Once | SUBCUTANEOUS | Status: DC

## 2017-01-15 NOTE — Telephone Encounter (Signed)
Pt was called concerning Prolia. Prolia was approved with a estimated out of pocket cost of $70. Medication was order and received. Pt came in on 01/15/2017 and received.

## 2017-03-20 NOTE — Progress Notes (Signed)
Chief Complaint  Patient presents with  . Medicare Wellness    fasting AWV/CPE with pap. Has eye exam scheduled for 04/11/17 with Dr. Lottie Dawson at [facility]. Has been experiencing a rapid heart rate rom time to time.     ANIQUA Klein is a 67 y.o. female who presents for annual wellness visit and follow-up on chronic medical conditions.  She has the following concerns:  Rapid heart rate:  Only occurred a couple of times over the last year.  Seems to occur in the evenings--sometimes after a meal, once when she was busy, had gone out to eat and to a concert.  Never related to exertion.  She thinks pulse was around 130 (the one time she used her BP cuff).  It was too fast for her to try and count her pulse.  She really can't recall if sudden onset.  She takes deep breaths, tries to relax, and it goes away. Can sometimes take 30 minutes.  Doesn't think it stops suddenly, but really hasn't really paid much attention. She thinks this only happened 2-3 times.  Saw cardiologist once in her 40's due to DOE, had stress test, and echo. That is when she was diagnosed with MVP.  She believes she saw Dr. Clarene Duke in the past (says Ermalinda Memos elsewhere in chart).  Osteoporosis: She continues on Prolia q6 months (last injection the end of October) without side effects.  She previously took Actonel x 6 years, changed to Fosamax (insurance reasons), stopped in 11/2011. She noticed improvement in bone pains/aches and hip pain since stopping alendronate in 2013.  Last DEXA was 05/2016--still showed osteoporosis in the spine, but had improved (T went from -2.8 to -2.5).  Postmenopausal with atrophic vaginitis, prescribed compounded progesterone (bi-est (6:4)+progest+test 0.3mg +2%+0.025mg  1cc topically)--she admits to forgetting to use it frequently.  She really hasn't had many symptoms recently, as she is not in a sexual relationship.  Her energy seems better when she uses it regularly.  Hasn't had the same anxiety  recently as she used to when she wasn't using it in the past. Denies any hot flashes or night sweats   Immunization History  Administered Date(s) Administered  . Influenza Split 03/21/2007, 03/20/2008, 01/19/2011  . Influenza, High Dose Seasonal PF 03/04/2015, 03/09/2016, 01/15/2017  . Influenza, Seasonal, Injecte, Preservative Fre 02/21/2012  . Influenza,inj,Quad PF,6+ Mos 02/24/2013, 02/25/2014  . PPD Test 02/11/1997  . Pneumococcal Conjugate-13 03/04/2015  . Pneumococcal Polysaccharide-23 03/09/2016  . Td 10/04/1990, 03/18/2001  . Tdap 04/13/2009  . Zoster 08/20/2012   Last Pap smear: 02/2013--normal with no high risk HPV Last mammogram: 05/2015--pt reports she had one done at same time as DEXA, but results weren't received. Last colonoscopy: 07/2011 Dr. Loreta Ave; hyperplastic polyps (10 yr f/u recommended) Last DEXA:  05/2016 (Prolis started 05/2015); T-2.5 at spine (improved from T-2.8 in 04/2014) Ophtho: every 6 months (glaucoma) or more frequently (due to changing meds) Dentist: regularly, every 6 months Exercise: yoga once or twice a week; treadmill once a week (30 minutes plus weights) at the [facility], though hasn't done this in the last month or two, due to being very busy. +gardening sporadically. Vitamin D level was normal twice in the past on current vitamins (last was 34 in 02/2014). Lipids: Lab Results  Component Value Date   CHOL 165 03/04/2015   HDL 55 03/04/2015   LDLCALC 93 03/04/2015   TRIG 84 03/04/2015   CHOLHDL 3.0 03/04/2015   Other doctors caring for patient include: GI: Dr. Loreta Ave Ophtho:  Dr. Emily Filbert (in the past, now seeing specialist) Eye specialist at [facility]: Dr. Lottie Dawson Dentist: Dr. Ninetta Lights GYN: Dr. Chevis Pretty (retired)  Depression screen: Negative Fall screen: negative  Functional status screen--notable for vision issues (glaucoma, sees ophtho), occasional urge/stress incontinence. See screens in epic.  End of Life Discussion: Patient hasa living will and medical  power of attorney  Past Medical History:  Diagnosis Date  . Abnormal Pap smear of cervix 1999   ASGUS x 1  . Allergic rhinitis, cause unspecified    mold; and vasomotor rhinitis  . Cataract   . Fibrocystic breast changes    and also breast calcifications (08/2008)  . Glaucoma   . Glaucoma    followed by [facility] Dr. Lottie Dawson  . Glaucoma suspect of both eyes    high-normal intraocular pressures (Dr. Emily Filbert)  . H/O bone density study 09/07/09   08/16/07, 08/08/05; osteoporosis  . H/O mammogram 11/23/10   normal  . Hypercholesteremia    borderline per NMR lipoprofile 2008, 2007  . Internal hemorrhoids   . Kidney stone 1986   right  . Migraine headache    resolved with menopause (menstrual migraines)  . MVP (mitral valve prolapse)    per echo  . Osteoporosis   . Pap smear for cervical cancer screening 04/14/09   negative for malignancy, +atrophic changes  . Postmenopausal    on compounded cream daily  . Unspecified vitamin D deficiency 2009    Past Surgical History:  Procedure Laterality Date  . BREAST CYST ASPIRATION     many (Dr. Maryagnes Amos)  . BREAST FIBROADENOMA SURGERY  09-03   Dr.Gooden  . cardiolite (stress test)  11/05   Dr. Ermalinda Memos  . cataract surgery  2010   bilateral  . CERVICAL POLYPECTOMY  2003   treated with cryosurgery  . COLONOSCOPY  08/02/11; 05/2000   Dr. Loreta Ave - rectal polyps 2013  . DILATION AND CURETTAGE OF UTERUS  05/2009   uterine polyp, bleeding  . endocervical cyst  12/96, 6/97   Dr Chevis Pretty  . EYE SURGERY    . KYPHOPLASTY N/A 04/29/2015   Procedure: KYPHOPLASTY;  Surgeon: Estill Bamberg, MD;  Location: [facility] OR;  Service: Orthopedics;  Laterality: N/A;  Lumbar 1 kyphoplasty    Social History   Socioeconomic History  . Marital status: Divorced    Spouse name: Not on file  . Number of children: 0  . Years of education: Not on file  . Highest education level: Not on file  Social Needs  . Financial resource strain: Not on file  . Food insecurity - worry: Not on  file  . Food insecurity - inability: Not on file  . Transportation needs - medical: Not on file  . Transportation needs - non-medical: Not on file  Occupational History  . 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 Smoking status: Never Smoker  . Smokeless tobacco: Never Used  Substance and Sexual Activity  . Alcohol use: Yes    Alcohol/week: 0.0 oz    Comment: a glass of wine if she eats out, up to 2/week  . Drug use: No  . Sexual activity: Not Currently    Partners: Male    Comment: has boyfriend, not currently sexually active  Other Topics Concern  . Not on file  Social History Narrative   Divorced, no children, exercise with yoga, goes to the [employer], school psychologist - retired.   Lives alone, 1 cat  Family History  Problem Relation Age of Onset  . Hypertension Mother   . Arthritis Mother   . Cancer Mother        breast cancer in her 45's and 8's (bilateral)  . Hypertension Father   . Other Father        died of pancreatitis  . Muscular dystrophy Maternal Aunt        adult onset  . Cancer Cousin        mouth (nonsmoker)  . Celiac disease Maternal Uncle   . Cancer Maternal Grandmother        cervical and ovarian  . Cancer Maternal Grandfather        pancreatic  . Diabetes Maternal Aunt     Outpatient Encounter Medications as of 03/22/2017  Medication Sig Note  . AMBULATORY NON FORMULARY MEDICATION Apply 1 mL topically 2 (two) times daily. BI-EST(6:4)+PROGEST+TEST 0.3MG  = 2% + 0.025MG  CREAM. 60 QUANTITY. 03/22/2017: Uses sporadically  . denosumab (PROLIA) 60 MG/ML SOLN injection Inject 60 mg into the skin every 6 (six) months. Administer in upper arm, thigh, or abdomen   . dorzolamide (TRUSOPT) 2 % ophthalmic solution PLACE 1 DROP INTO both EYE(S) THREE TIMES A DAY   . latanoprost (XALATAN) 0.005 % ophthalmic solution Place 1 drop into both eyes at bedtime.     . LUTEIN PO Take 1 capsule by mouth  daily.    . Multiple Minerals-Vitamins (CITRACAL PLUS) TABS Take 3 tablets by mouth daily.    . Multiple Vitamins-Minerals (CENTRUM SILVER PO) Take 1 tablet by mouth daily.     . [DISCONTINUED] apraclonidine (IOPIDINE) 0.5 % ophthalmic solution Place 1 drop into both eyes 2 (two) times daily.   . [DISCONTINUED] loratadine (CLARITIN) 10 MG tablet Take 10 mg by mouth daily as needed for allergies.    No facility-administered encounter medications on file as of 03/22/2017.     Allergies  Allergen Reactions  . Brinzolamide-Brimonidine Itching  . Cortisone Other (See Comments)    Entire body hurt from a cortisone shot.  . Fosamax [Alendronate Sodium]     GI upset, bone pain; tolerated Actonel  . Timolol Other (See Comments)    Slowed her heart rate down  . Tramadol Rash   ROS: The patient denies anorexia, fever, weight changes, headaches,  decreased hearing, ear pain, sore throat, breast concerns, chest pain, palpitations, dizziness, syncope, dyspnea on exertion, cough, swelling (just slight in the left foot she recently sprained), nausea, vomiting, abdominal pain, diarrhea, melena, hematochezia, indigestion/heartburn (occasional heartburn), hematuria,dysuria, postmenopausal bleeding, vaginal discharge, odor or itch, genital lesions, weakness, tremor, suspicious skin lesions, depression, anxiety, abnormal bleeding/bruising, or enlarged lymph nodes. Some urinary urgency, only rare leakage (on the way to the bathroom); slight urgency/incontinence when she has colds/cough. Occasionally stuffy nose and frontal headaches, intermittent, not recently Some diarrhea related to pilocarpine drops, improved (uses probiotics as needed). +vision changes--visual field loss from glaucoma; no longer driving at night), Frequent medication changes and close monitoring of her pressures. Frequent tick bites--never had any reactions, no rashes, joint pains, symptoms.     PHYSICAL EXAM:  BP 110/72   Pulse 60   Ht  5\' 7"  (1.702 m)   Wt 141 lb 12.8 oz (64.3 kg)   LMP 03/20/2000   BMI 22.21 kg/m   Wt Readings from Last 3 Encounters:  03/22/17 141 lb 12.8 oz (64.3 kg)  03/09/16 138 lb 12.8 oz (63 kg)  04/29/15 137 lb (62.1 kg)    General Appearance:  Alert,  cooperative, no distress, appears her age  Head:  Normocephalic, without obvious abnormality, atraumatic   Eyes:  Miotic pupils, fundi not visualized; conjunctive clear; EOM's intact  Ears:  Normal TM's and external ear canals   Nose:  Nares normal, mucosa is normal, no erythema, no drainage or sinus tenderness.  Throat:  Lips, mucosa, and tongue normal; teeth and gums normal   Neck:  Supple, no lymphadenopathy; thyroid: no enlargement/tenderness/nodules; no carotid bruit or JVD   Back:  Spine nontender, no curvature, ROM normal, no CVA tenderness   Lungs:  Clear to auscultation bilaterally without wheezes, rales or rhonchi; respirations unlabored   Chest Wall:  No tenderness or deformity   Heart:  Regular Rhythm, bradycardic (56-60); S1 and S2 normal, no murmur, rub or gallop.   Breast Exam:  No tenderness, masses, or nipple discharge or inversion. No axillary lymphadenopathy   Abdomen:  Soft, non-tender, nondistended, normoactive bowel sounds,  no masses, no hepatosplenomegaly   Genitalia:  Normal external genitalia without lesions. Mild atrophic changes. No cervical motion tenderness. Uterus and adnexa not enlarged, nontender, no masses. Pap not performed   Rectal:  Normal tone, no masses or tenderness; guaiac negative stool   Extremities:  No clubbing, cyanosis or edema.   Pulses:  2+ dorsalis pedis and right posterior tibialis, 1+ left posterior tib  Skin:  Skin color, texture, turgor normal. Hyperpigmented patches at the anteromedial left lower leg.  Dermatofibroma also noted left medial calf.  New discolored lesion is noted above the left medial malleolus.  Lymph nodes:   Cervical, supraclavicular, and axillary nodes normal   Neurologic:  CNII-XII intact, normal strength, sensation and gait; reflexes 2+ and symmetric throughout    Psych: Normal mood, affect, hygiene and grooming     ASSESSMENT/PLAN:   Annual physical exam - Plan: Comprehensive metabolic panel, CBC with Differential/Platelet, TSH, POCT Urinalysis DIP (Proadvantage Device)  Medicare annual wellness visit, subsequent  Osteoporosis without current pathological fracture, unspecified osteoporosis type  Palpitations - Plan: Comprehensive metabolic panel, CBC with Differential/Platelet, TSH, Ambulatory referral to Cardiology  Mitral valve prolapse - Plan: Ambulatory referral to Cardiology  Post-menopausal atrophic vaginitis - discussed risks/benefits of meds; no hot flashes, no pain (not sexually active); okay to just use creams prn (ie prior to pap next year)   Hold off on pap until next year (hasn't been using hormonal creams, may be more painful, and only needs q5 years)  See derm for rash on legs, specifically the new pigmented lesion above the ankle.  REFER to cardiology for eval of palpitations, given h/o MVP.  Likely should have repeat echo and poss event monitor. Discussed Ddx (suspect SVT, vs afib) Instructed on how to check pulse, and what to document on calendar if occurs again, when to seek immediate care.  Will call [facility] to get mammo results from last March.  Discussed monthly self breast exams and yearly mammograms; at least 30 minutes of aerobic activity at least 5 days/week, weight-bearing exercise 2x/week; proper sunscreen use reviewed; healthy diet, including goals of calcium and vitamin D intake and alcohol recommendations (less than or equal to 1 drink/day) reviewed; regular seatbelt use; changing batteries in smoke detectors. Immunization recommendations discussed--Continue high dose flu shots yearly. Shingrix recommended.Risks/side  effects reviewed. Colonoscopy recommendations reviewed--UTD, due again 07/2021 DEXA due 05/2018  Full Code, Full Care MOST form reviewed and updated.  Medicare Attestation I have personally reviewed: The patient's medical and social history Their use of alcohol, tobacco or illicit drugs Their current medications  and supplements The patient's functional ability including ADLs,fall risks, home safety risks, cognitive, and hearing and visual impairment Diet and physical activities Evidence for depression or mood disorders  The patient's weight, height and BMI have been recorded in the chart.  I have made referrals, counseling, and provided education to the patient based on review of the above and I have provided the patient with a written personalized care plan for preventive services.

## 2017-03-22 ENCOUNTER — Encounter: Payer: Self-pay | Admitting: Family Medicine

## 2017-03-22 ENCOUNTER — Ambulatory Visit: Payer: Medicare Other | Admitting: Family Medicine

## 2017-03-22 VITALS — BP 110/72 | HR 60 | Ht 67.0 in | Wt 141.8 lb

## 2017-03-22 DIAGNOSIS — I341 Nonrheumatic mitral (valve) prolapse: Secondary | ICD-10-CM

## 2017-03-22 DIAGNOSIS — R002 Palpitations: Secondary | ICD-10-CM | POA: Diagnosis not present

## 2017-03-22 DIAGNOSIS — Z0000 Encounter for general adult medical examination without abnormal findings: Secondary | ICD-10-CM | POA: Diagnosis not present

## 2017-03-22 DIAGNOSIS — M810 Age-related osteoporosis without current pathological fracture: Secondary | ICD-10-CM | POA: Diagnosis not present

## 2017-03-22 DIAGNOSIS — N952 Postmenopausal atrophic vaginitis: Secondary | ICD-10-CM

## 2017-03-22 LAB — POCT URINALYSIS DIP (PROADVANTAGE DEVICE)
BILIRUBIN UA: NEGATIVE
GLUCOSE UA: NEGATIVE mg/dL
Ketones, POC UA: NEGATIVE mg/dL
Leukocytes, UA: NEGATIVE
NITRITE UA: NEGATIVE
PH UA: 7.5 (ref 5.0–8.0)
Protein Ur, POC: NEGATIVE mg/dL
RBC UA: NEGATIVE
SPECIFIC GRAVITY, URINE: 1.015
Urobilinogen, Ur: NEGATIVE

## 2017-03-22 NOTE — Patient Instructions (Addendum)
HEALTH MAINTENANCE RECOMMENDATIONS:  It is recommended that you get at least 30 minutes of aerobic exercise at least 5 days/week (for weight loss, you may need as much as 60-90 minutes). This can be any activity that gets your heart rate up. This can be divided in 10-15 minute intervals if needed, but try and build up your endurance at least once a week.  Weight bearing exercise is also recommended twice weekly.  Eat a healthy diet with lots of vegetables, fruits and fiber.  "Colorful" foods have a lot of vitamins (ie green vegetables, tomatoes, red peppers, etc).  Limit sweet tea, regular sodas and alcoholic beverages, all of which has a lot of calories and sugar.  Up to 1 alcoholic drink daily may be beneficial for women (unless trying to lose weight, watch sugars).  Drink a lot of water.  Calcium recommendations are 1200-1500 mg daily (1500 mg for postmenopausal women or women without ovaries), and vitamin D 1000 IU daily.  This should be obtained from diet and/or supplements (vitamins), and calcium should not be taken all at once, but in divided doses.  Monthly self breast exams and yearly mammograms for women over the age of 40 is recommended.  Sunscreen of at least SPF 30 should be used on all sun-exposed parts of the skin when outside between the hours of 10 am and 4 pm (not just when at beach or pool, but even with exercise, golf, tennis, and yard work!)  Use a sunscreen that says "broad spectrum" so it covers both UVA and UVB rays, and make sure to reapply every 1-2 hours.  Remember to change the batteries in your smoke detectors when changing your clock times in the spring and fall.  Use your seat belt every time you are in a car, and please drive safely and not be distracted with cell phones and texting while driving.   Ms. Bartelt , Thank you for taking time to come for your Medicare Wellness Visit. I appreciate your ongoing commitment to your health goals. Please review the  following plan we discussed and let me know if I can assist you in the future.   These are the goals we discussed: Goals    None      This is a list of the screening recommended for you and due dates:  Health Maintenance  Topic Date Due  . Mammogram  05/26/2017  . Tetanus Vaccine  04/14/2019  . Colon Cancer Screening  08/01/2021  . Flu Shot  Completed  . DEXA scan (bone density measurement)  Completed  .  Hepatitis C: One time screening is recommended by Center for Disease Control  (CDC) for  adults born from [birth year] through 1966.   Completed  . Pneumonia vaccines  Completed   Bone density will be due again 05/2018.  We never got mammogram report, just the DEXA from 05/31/16.  We will call to get it. I recommend yearly mammograms, due again after 05/31/17 (if you had it on that date last year).  I recommend getting the new shingles vaccine (Shingrix). You will need to check with your insurance to see if it is covered, and if covered by Medicare Part D, you need to get from the pharmacy rather than our office.  It is a series of 2 injections, spaced 2 months apart.  Keep track of the episodes of fast heart rate--when they occur, what you are doing, how fast it is going, whether it is regular or irregular, and if it  starts/ends suddenly vs gradually.  Report how long it lasts. If you feel lightheaded or chest pain associated with the episode, you should seek medical assistance.  We are going to refer you to a cardiologist for further evaluation.   See dermatologist for rash on legs, specifically the new pigmented lesion above the ankle.

## 2017-03-23 LAB — COMPREHENSIVE METABOLIC PANEL
AG Ratio: 1.9 (calc) (ref 1.0–2.5)
ALBUMIN MSPROF: 4.3 g/dL (ref 3.6–5.1)
ALKALINE PHOSPHATASE (APISO): 38 U/L (ref 33–130)
ALT: 12 U/L (ref 6–29)
AST: 17 U/L (ref 10–35)
BILIRUBIN TOTAL: 0.4 mg/dL (ref 0.2–1.2)
BUN/Creatinine Ratio: 18 (calc) (ref 6–22)
BUN: 19 mg/dL (ref 7–25)
CALCIUM: 8.8 mg/dL (ref 8.6–10.4)
CHLORIDE: 107 mmol/L (ref 98–110)
CO2: 27 mmol/L (ref 20–32)
CREATININE: 1.05 mg/dL — AB (ref 0.50–0.99)
GLOBULIN: 2.3 g/dL (ref 1.9–3.7)
Glucose, Bld: 77 mg/dL (ref 65–99)
POTASSIUM: 4.2 mmol/L (ref 3.5–5.3)
SODIUM: 140 mmol/L (ref 135–146)
TOTAL PROTEIN: 6.6 g/dL (ref 6.1–8.1)

## 2017-03-23 LAB — CBC WITH DIFFERENTIAL/PLATELET
BASOS ABS: 49 {cells}/uL (ref 0–200)
Basophils Relative: 0.9 %
Eosinophils Absolute: 108 cells/uL (ref 15–500)
Eosinophils Relative: 2 %
HEMATOCRIT: 40.5 % (ref 35.0–45.0)
Hemoglobin: 13.2 g/dL (ref 11.7–15.5)
LYMPHS ABS: 1501 {cells}/uL (ref 850–3900)
MCH: 30.2 pg (ref 27.0–33.0)
MCHC: 32.6 g/dL (ref 32.0–36.0)
MCV: 92.7 fL (ref 80.0–100.0)
MPV: 9.4 fL (ref 7.5–12.5)
Monocytes Relative: 9.6 %
NEUTROS PCT: 59.7 %
Neutro Abs: 3224 cells/uL (ref 1500–7800)
PLATELETS: 321 10*3/uL (ref 140–400)
RBC: 4.37 10*6/uL (ref 3.80–5.10)
RDW: 12.1 % (ref 11.0–15.0)
TOTAL LYMPHOCYTE: 27.8 %
WBC: 5.4 10*3/uL (ref 3.8–10.8)
WBCMIX: 518 {cells}/uL (ref 200–950)

## 2017-03-23 LAB — TSH: TSH: 2.42 mIU/L (ref 0.40–4.50)

## 2017-04-19 ENCOUNTER — Telehealth: Payer: Self-pay

## 2017-04-19 DIAGNOSIS — Z780 Asymptomatic menopausal state: Secondary | ICD-10-CM

## 2017-04-19 MED ORDER — AMBULATORY NON FORMULARY MEDICATION: 1.0000 mL | Freq: Two times a day (BID) | 1 refills | Status: DC

## 2017-04-19 NOTE — Telephone Encounter (Signed)
Ok for 60mL with a refill

## 2017-04-19 NOTE — Telephone Encounter (Signed)
This was sent to me, forwarding to you for approval.

## 2017-04-19 NOTE — Telephone Encounter (Signed)
Pharmacy requesting refill on compounded progestrone\ testosterone #60 ml. Thanks [employer]

## 2017-04-19 NOTE — Telephone Encounter (Signed)
Done

## 2017-05-11 ENCOUNTER — Encounter: Payer: Self-pay | Admitting: Cardiology

## 2017-05-11 ENCOUNTER — Ambulatory Visit: Payer: Medicare Other | Admitting: Cardiology

## 2017-05-11 VITALS — BP 120/68 | HR 68 | Ht 68.0 in | Wt 142.0 lb

## 2017-05-11 DIAGNOSIS — R002 Palpitations: Secondary | ICD-10-CM | POA: Diagnosis not present

## 2017-05-11 DIAGNOSIS — R000 Tachycardia, unspecified: Secondary | ICD-10-CM | POA: Diagnosis not present

## 2017-05-11 MED ORDER — DILTIAZEM HCL 30 MG PO TABS: 30.0000 mg | ORAL_TABLET | Freq: Four times a day (QID) | ORAL | 6 refills | Status: DC | PRN

## 2017-05-11 NOTE — Patient Instructions (Signed)
Medication Instructions:  You have take Diltiazem 30 mg every 6 hours as needed for rapid heart rate. Continue all other medications as listed.  Testing/Procedures: Your physician has requested that you have an echocardiogram. Echocardiography is a painless test that uses sound waves to create images of your heart. It provides your doctor with information about the size and shape of your heart and how well your heart's chambers and valves are working. This procedure takes approximately one hour. There are no restrictions for this procedure.  Follow-Up: Follow up in 1 year with Dr. Anne FuSkains.  You will receive a letter in the mail 2 months before you are due.  Please call us when you receive this letter to schedule your follow up appointment.  If you need a refill on your cardiac medications before your next appointment, please call your pharmacy.  Thank you for choosing [facility]!!

## 2017-05-11 NOTE — Progress Notes (Signed)
Cardiology Office Note:    Date:  05/11/2017   ID:  Paige Klein, DOB 05/01/1950, MRN [id number]  PCP:  [name], MD  Cardiologist:  No primary care provider on file.   Referring MD: [name], MD     History of Present Illness:    Paige Klein is a 67 y.o. female here for the evaluation of rapid heart rate at the request of Dr. Lynelle Doctor.  In review of her prior office note she described a couple of episodes over the past year of rapid heart rate occurring mostly in the evenings sometimes after a meal, once occurring when she felt dizzy and had gone out to eat after concert.  Does not seem to be related with exertion.  Her pulse may have been approximately 150, one time she used a blood pressure cuff for confirmation.  It seemed to be too fast for her to try to count her pulse.  When she takes a few deep breaths and relaxes, it can go away.  Sometimes it can take up to a half an hour before it resolves.  Hard for her to know whether or not it stopped suddenly but she had not paid much attention to this.  Tea once a day.   Back in her 40s she saw a cardiologist because of shortness of breath.  Stress test and echo was done and she was diagnosed with mitral valve prolapse.  Past Medical History:  Diagnosis Date  . Abnormal Pap smear of cervix 1999   ASGUS x 1  . Allergic rhinitis, cause unspecified    mold; and vasomotor rhinitis  . Cataract   . Fibrocystic breast changes    and also breast calcifications (08/2008)  . Glaucoma   . Glaucoma    followed by [facility] Dr. Lottie Dawson  . Glaucoma suspect of both eyes    high-normal intraocular pressures (Dr. Emily Filbert)  . H/O bone density study 09/07/09   08/16/07, 08/08/05; osteoporosis  . H/O mammogram 11/23/10   normal  . Hypercholesteremia    borderline per NMR lipoprofile 2008, 2007  . Internal hemorrhoids   . Kidney stone 1986   right  . Migraine headache    resolved with menopause (menstrual migraines)  . MVP (mitral valve  prolapse)    per echo  . Osteoporosis   . Pap smear for cervical cancer screening 04/14/09   negative for malignancy, +atrophic changes  . Postmenopausal    on compounded cream daily  . Unspecified vitamin D deficiency 2009    Past Surgical History:  Procedure Laterality Date  . BREAST CYST ASPIRATION     many (Dr. Maryagnes Amos)  . BREAST FIBROADENOMA SURGERY  09-03   Dr.Gooden  . cardiolite (stress test)  11/05   Dr. Ermalinda Memos  . cataract surgery  2010   bilateral  . CERVICAL POLYPECTOMY  2003   treated with cryosurgery  . COLONOSCOPY  08/02/11; 05/2000   Dr. Loreta Ave - rectal polyps 2013  . DILATION AND CURETTAGE OF UTERUS  05/2009   uterine polyp, bleeding  . endocervical cyst  12/96, 6/97   Dr Chevis Pretty  . EYE SURGERY    . KYPHOPLASTY N/A 04/29/2015   Procedure: KYPHOPLASTY;  Surgeon: Estill Bamberg, MD;  Location: [facility] OR;  Service: Orthopedics;  Laterality: N/A;  Lumbar 1 kyphoplasty    Current Medications: Current Meds  Medication Sig  . AMBULATORY NON FORMULARY MEDICATION Apply 1 mL topically 2 (two) times daily. BI-EST(6:4)+PROGEST+TEST 0.3MG  = 2% + 0.025MG  CREAM. 60  QUANTITY.  Marland Kitchen. denosumab (PROLIA) 60 MG/ML SOLN injection Inject 60 mg into the skin every 6 (six) months. Administer in upper arm, thigh, or abdomen  . dorzolamide (TRUSOPT) 2 % ophthalmic solution PLACE 1 DROP INTO both EYE(S) THREE TIMES A DAY  . latanoprost (XALATAN) 0.005 % ophthalmic solution Place 1 drop into both eyes at bedtime.    . LUTEIN PO Take 1 capsule by mouth daily.   . Multiple Vitamins-Minerals (CENTRUM SILVER PO) Take 1 tablet by mouth daily.    . Netarsudil Dimesylate 0.02 % SOLN Place 1 drop into both eyes daily.  . pilocarpine (PILOCAR) 4 % ophthalmic solution Place 1 drop into both eyes 3 (three) times daily.     Allergies:   Brinzolamide-brimonidine; Cortisone; Fosamax [alendronate sodium]; Timolol; and Tramadol   Social History   Socioeconomic History  . Marital status: Divorced    Spouse name:  None  . Number of children: 0  . Years of education: None  . Highest education level: None  Social Needs  . Financial resource strain: None  . Food insecurity - worry: None  . Food insecurity - inability: None  . Transportation needs - medical: None  . Transportation needs - non-medical: None  Occupational History  . 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 Smoking status: Never Smoker  . Smokeless tobacco: Never Used  Substance and Sexual Activity  . Alcohol use: Yes    Alcohol/week: 0.0 oz    Comment: a glass of wine if she eats out, up to 2/week  . Drug use: No  . Sexual activity: Not Currently    Partners: Male    Comment: has boyfriend, not currently sexually active  Other Topics Concern  . None  Social History Narrative   Divorced, no children, exercise with yoga, goes to the [employer], school psychologist - retired.   Lives alone, 1 cat     Family History: The patient's family history includes Arthritis in her mother; Cancer in her cousin, maternal grandfather, maternal grandmother, and mother; Celiac disease in her maternal uncle; Diabetes in her maternal aunt; Hypertension in her father and mother; Muscular dystrophy in her maternal aunt; Other in her father.  ROS:   Please see the history of present illness.     All other systems reviewed and are negative.  EKGs/Labs/Other Studies Reviewed:    The following studies were reviewed today: Prior office notes, lab work, EKG reviewed  EKG:  EKG is  ordered today.  The ekg ordered today demonstrates sinus bradycardia 49 with no other abnormalities personally viewed.  Recent Labs: 03/22/2017: ALT 12; BUN 19; Creat 1.05; Hemoglobin 13.2; Platelets 321; Potassium 4.2; Sodium 140; TSH 2.42  Recent Lipid Panel    Component Value Date/Time   CHOL 165 03/04/2015 0001   TRIG 84 03/04/2015 0001   HDL 55 03/04/2015 0001   CHOLHDL 3.0 03/04/2015 0001   VLDL 17  03/04/2015 0001   LDLCALC 93 03/04/2015 0001    Physical Exam:    VS:  BP 120/68   Pulse 68   Ht 5\' 8"  (1.727 m)   Wt 142 lb (64.4 kg)   LMP 03/20/2000   SpO2 99%   BMI 21.59 kg/m     Wt Readings from Last 3 Encounters:  05/11/17 142 lb (64.4 kg)  03/22/17 141 lb 12.8 oz (64.3 kg)  03/09/16 138 lb 12.8 oz (63 kg)     GEN:  Well nourished, well  developed in no acute distress HEENT: Normal NECK: No JVD; No carotid bruits LYMPHATICS: No lymphadenopathy CARDIAC: RRR, no murmurs, rubs, gallops RESPIRATORY:  Clear to auscultation without rales, wheezing or rhonchi  ABDOMEN: Soft, non-tender, non-distended MUSCULOSKELETAL:  No edema; No deformity  SKIN: Warm and dry NEUROLOGIC:  Alert and oriented x 3 PSYCHIATRIC:  Normal affect   ASSESSMENT:    1. Rapid heart rate   2. Palpitations    PLAN:    In order of problems listed above:  Palpitations/rapid heart rate transient -Differential includes PSVT, reentrant tachycardia, atrial tachycardia, atrial flutter or perhaps atrial fibrillation.  Of course atrial fibrillation has ramifications of anticoagulation and stroke risk.  She very rarely has these episodes and I offered her an event monitor but I do think the utility of this would be very low at this time.  If she begins to have these episodes again we will have low threshold for placing an event monitor or perhaps trying to obtain an EKG at that moment.  I also recommended possibly getting the cardia device which can be associated with her smart phone to give an EKG tracing.  Regardless, I will check an echocardiogram to ensure proper structure and function.  She has had mitral valve prolapse in the past which can be associated with palpitations.  She has not had any high risk symptoms such as syncope.  Many years ago, 2005, she remembers while gardening having episodes of shortness of breath which prompted her prior echo and stress test.  She has not had any shortness of breath  with these episodes.  They are almost an after thought.  Avoid excessive caffeine, avoid Sudafed.  TSH was normal at 2.4.  I also described Valsalva type maneuvers which could help with a reentrant tachycardia.  She knows to contact us if she has any further worrisome symptoms.  We will give her diltiazem 30 mg every 6 as needed for any tachycardias.  In the past her resting heart rate has been quite slow in fact, she had to stop atenolol eyedrops because of bradycardia.  I will not give her any long-standing medication or daily medication for this.  We will see her back in 1 year.    Medication Adjustments/Labs and Tests Ordered: Current medicines are reviewed at length with the patient today.  Concerns regarding medicines are outlined above.  Orders Placed This Encounter  Procedures  . EKG 12-Lead  . ECHOCARDIOGRAM COMPLETE   Meds ordered this encounter  Medications  . diltiazem (CARDIZEM) 30 MG tablet    Sig: Take 1 tablet (30 mg total) by mouth every 6 (six) hours as needed (as needed for rapid heart rate).    Dispense:  30 tablet    Refill:  6    Signed, Donato Schultz, MD  05/11/2017 11:16 AM    [facility]

## 2017-05-22 ENCOUNTER — Encounter (HOSPITAL_COMMUNITY): Payer: Self-pay

## 2017-05-22 ENCOUNTER — Ambulatory Visit (INDEPENDENT_AMBULATORY_CARE_PROVIDER_SITE_OTHER): Payer: Medicare Other | Admitting: Interventional Cardiology

## 2017-05-22 ENCOUNTER — Encounter: Payer: Self-pay | Admitting: Interventional Cardiology

## 2017-05-22 ENCOUNTER — Ambulatory Visit (HOSPITAL_COMMUNITY): Payer: Medicare Other

## 2017-05-22 VITALS — BP 160/80 | HR 122 | Ht 68.0 in | Wt 142.0 lb

## 2017-05-22 DIAGNOSIS — I4891 Unspecified atrial fibrillation: Secondary | ICD-10-CM

## 2017-05-22 HISTORY — DX: Unspecified atrial fibrillation: I48.91

## 2017-05-22 MED ORDER — DILTIAZEM HCL ER COATED BEADS 120 MG PO CP24: 120.0000 mg | ORAL_CAPSULE | Freq: Every day | ORAL | 1 refills | Status: DC

## 2017-05-22 MED ORDER — APIXABAN 5 MG PO TABS: 5.0000 mg | ORAL_TABLET | Freq: Two times a day (BID) | ORAL | 3 refills | Status: DC

## 2017-05-22 NOTE — Patient Instructions (Signed)
Medication Instructions:  Your physician has recommended you make the following change in your medication:   1. START: Diltiazem 120 mg daily  2. START: Eliquis 5 mg twice a day   Labwork: None ordered  Testing/Procedures: Your physician has requested that you have reschedule your echocardiogram for 1 week.     Follow-Up: Your physician recommends that you schedule a follow-up appointment in: 1 week with Dr. Anne FuSkains or APP on his team    Any Other Special Instructions Will Be Listed Below (If Applicable).     If you need a refill on your cardiac medications before your next appointment, please call your pharmacy.

## 2017-05-22 NOTE — Progress Notes (Signed)
Cardiology Office Note   Date:  05/22/2017   ID:  Paige Klein, DOB 10/10/1950, MRN [id number]  PCP:  Joselyn ArrowKnapp, Eve, MD   Primary cardiologist: Dr. Anne FuSkains   No chief complaint on file.  Atrial fibrillation  Wt Readings from Last 3 Encounters:  05/22/17 142 lb (64.4 kg)  05/11/17 142 lb (64.4 kg)  03/22/17 141 lb 12.8 oz (64.3 kg)       History of Present Illness: Paige Klein is a 67 y.o. female with a history of hypertension who had reported palpitations.  She came to the office today for a routine echocardiogram.  She was found to be in a rapid rhythm.  Subsequent ECG revealed atrial fibrillation with rapid ventricular response.  When I saw her, she could feel some palpitations.  At one point, the palpitations settled and her pulse was regular.  It then became rapid again and on the echo machine, it appeared that she was in atrial flutter.  She denies any bleeding problems.    Denies : Chest pain. Dizziness. Leg edema. Nitroglycerin use. Orthopnea. Paroxysmal nocturnal dyspnea. Shortness of breath. Syncope.   Her palpitations are usually short-lived.    She has had issues with fatigue in the past when taking a beta-blocker.    Past Medical History:  Diagnosis Date  . Abnormal Pap smear of cervix 1999   ASGUS x 1  . Allergic rhinitis, cause unspecified    mold; and vasomotor rhinitis  . Cataract   . Fibrocystic breast changes    and also breast calcifications (08/2008)  . Glaucoma   . Glaucoma    followed by [facility] Dr. Lottie DawsonBond  . Glaucoma suspect of both eyes    high-normal intraocular pressures (Dr. Emily FilbertGould)  . H/O bone density study 09/07/09   08/16/07, 08/08/05; osteoporosis  . H/O mammogram 11/23/10   normal  . Hypercholesteremia    borderline per NMR lipoprofile 2008, 2007  . Internal hemorrhoids   . Kidney stone 1986   right  . Migraine headache    resolved with menopause (menstrual migraines)  . MVP (mitral valve prolapse)    per echo  .  Osteoporosis   . Pap smear for cervical cancer screening 04/14/09   negative for malignancy, +atrophic changes  . Postmenopausal    on compounded cream daily  . Unspecified vitamin D deficiency 2009    Past Surgical History:  Procedure Laterality Date  . BREAST CYST ASPIRATION     many (Dr. Maryagnes AmosLeone)  . BREAST FIBROADENOMA SURGERY  09-03   Dr.Gooden  . cardiolite (stress test)  11/05   Dr. Ermalinda MemosBradshaw  . cataract surgery  2010   bilateral  . CERVICAL POLYPECTOMY  2003   treated with cryosurgery  . COLONOSCOPY  08/02/11; 05/2000   Dr. Loreta AveMann - rectal polyps 2013  . DILATION AND CURETTAGE OF UTERUS  05/2009   uterine polyp, bleeding  . endocervical cyst  12/96, 6/97   Dr Chevis PrettyMezer  . EYE SURGERY    . KYPHOPLASTY N/A 04/29/2015   Procedure: KYPHOPLASTY;  Surgeon: Estill BambergMark Dumonski, MD;  Location: [facility] OR;  Service: Orthopedics;  Laterality: N/A;  Lumbar 1 kyphoplasty     Current Outpatient Medications  Medication Sig Dispense Refill  . AMBULATORY NON FORMULARY MEDICATION Apply 1 mL topically 2 (two) times daily. BI-EST(6:4)+PROGEST+TEST 0.3MG  = 2% + 0.025MG  CREAM. 60 QUANTITY. 60 mL 1  . apixaban (ELIQUIS) 5 MG TABS tablet Take 1 tablet (5 mg total) by mouth 2 (two) times daily.  60 tablet 3  . denosumab (PROLIA) 60 MG/ML SOLN injection Inject 60 mg into the skin every 6 (six) months. Administer in upper arm, thigh, or abdomen    . diltiazem (CARDIZEM CD) 120 MG 24 hr capsule Take 1 capsule (120 mg total) by mouth daily. 30 capsule 1  . diltiazem (CARDIZEM) 30 MG tablet Take 1 tablet (30 mg total) by mouth every 6 (six) hours as needed (as needed for rapid heart rate). 30 tablet 6  . dorzolamide (TRUSOPT) 2 % ophthalmic solution PLACE 1 DROP INTO both EYE(S) THREE TIMES A DAY  3  . latanoprost (XALATAN) 0.005 % ophthalmic solution Place 1 drop into both eyes at bedtime.      . LUTEIN PO Take 1 capsule by mouth daily.     . Multiple Vitamins-Minerals (CENTRUM SILVER PO) Take 1 tablet by mouth daily.       . Netarsudil Dimesylate 0.02 % SOLN Place 1 drop into both eyes daily.    . pilocarpine (PILOCAR) 4 % ophthalmic solution Place 1 drop into both eyes 3 (three) times daily.     No current facility-administered medications for this visit.     Allergies:   Brinzolamide-brimonidine; Cortisone; Fosamax [alendronate sodium]; Timolol; and Tramadol    Social History:  The patient  reports that  has never smoked. she has never used smokeless tobacco. She reports that she drinks alcohol. She reports that she does not use drugs.   Family History:  The patient's family history includes Arthritis in her mother; Cancer in her cousin, maternal grandfather, maternal grandmother, and mother; Celiac disease in her maternal uncle; Diabetes in her maternal aunt; Hypertension in her father and mother; Muscular dystrophy in her maternal aunt; Other in her father.    ROS:  Please see the history of present illness.   Otherwise, review of systems are positive for palpitations.   All other systems are reviewed and negative.    PHYSICAL EXAM: VS:  BP (!) 160/80 (BP Location: Left Arm, Patient Position: Supine)   Pulse (!) 122   Ht 5\' 8"  (1.727 m)   Wt 142 lb (64.4 kg)   LMP 03/20/2000   BMI 21.59 kg/m  , BMI Body mass index is 21.59 kg/m. GEN: Well nourished, well developed, in no acute distress  HEENT: normal  Neck: no JVD, carotid bruits, or masses Cardiac: Tachycardic, irregular; no murmurs, rubs, or gallops,no edema  Respiratory:  clear to auscultation bilaterally, normal work of breathing GI: soft, nontender, nondistended, + BS MS: no deformity or atrophy  Skin: warm and dry, no rash Neuro:  Strength and sensation are intact Psych: euthymic mood, full affect   EKG:   The ekg ordered today demonstrates atrial fibrillation with rapid ventricular response   Recent Labs: 03/22/2017: ALT 12; BUN 19; Creat 1.05; Hemoglobin 13.2; Platelets 321; Potassium 4.2; Sodium 140; TSH 2.42   Lipid Panel      Component Value Date/Time   CHOL 165 03/04/2015 0001   TRIG 84 03/04/2015 0001   HDL 55 03/04/2015 0001   CHOLHDL 3.0 03/04/2015 0001   VLDL 17 03/04/2015 0001   LDLCALC 93 03/04/2015 0001     Other studies Reviewed: Additional studies/ records that were reviewed today with results demonstrating: Normal TSH in January 2019.   ASSESSMENT AND PLAN:  1. Atrial fibrillation with rapid ventricular response: Start diltiazem CD 120 mg daily.  Start Eliquis 5 mg p.o. twice daily for stroke prevention.  Her renal function is normal.  No known  bleeding problems.  The risks and benefits of anticoagulation was explained to the patient and she is agreeable.  We discussed various options and she preferred not to take Coumadin. 2. It is elevated today. Blood pressure sometimes on the low side.  She did not want to try beta-blocker due to prior issues with fatigue. 3. Plan to repeat echocardiogram when her heart rate is slower.  I suspect she will probably convert to normal sinus rhythm given how paroxysmal her symptoms are. This patients CHA2DS2-VASc Score and unadjusted Ischemic Stroke Rate (% per year) is equal to 2.2 % stroke rate/year from a score of 2  Above score calculated as 1 point each if present [CHF, HTN, DM, Vascular=MI/PAD/Aortic Plaque, Age if 65-74, or Female] Above score calculated as 2 points each if present [Age > 75, or Stroke/TIA/TE]     Current medicines are reviewed at length with the patient today.  The patient concerns regarding her medicines were addressed.  The following changes have been made: Start Eliquis and diltiazem  Labs/ tests ordered today include:  Orders Placed This Encounter  Procedures  . EKG 12-Lead    Recommend 150 minutes/week of aerobic exercise Low fat, low carb, high fiber diet recommended  Disposition:   FU in 1-2 weeks with Dr. Anne Fu   Signed, Lance Muss, MD  05/22/2017 2:31 PM    [facility] [address]  [place], [place], [place]  [address] Phone: [phone]; Fax: [phone]

## 2017-05-22 NOTE — Progress Notes (Signed)
Paige Klein presented for an echocardiogram. Her heart rate was noted between 120 and 160bpm. DOD (Dr. Eldridge DaceVaranasi) was notified and EKG was performed. EKG showed she was in Afib RVR. Dr. Eldridge DaceVaranasi advised to reschedule echo when patient is back in rhythm.  [name], RDCS

## 2017-05-22 NOTE — Progress Notes (Signed)
Thanks for the update Kimbella Heisler, MD  

## 2017-05-31 ENCOUNTER — Ambulatory Visit (HOSPITAL_COMMUNITY): Payer: Medicare Other | Attending: Cardiovascular Disease

## 2017-05-31 ENCOUNTER — Encounter: Payer: Self-pay | Admitting: Cardiology

## 2017-05-31 ENCOUNTER — Other Ambulatory Visit: Payer: Self-pay

## 2017-05-31 ENCOUNTER — Ambulatory Visit: Payer: Medicare Other | Admitting: Cardiology

## 2017-05-31 VITALS — BP 120/78 | HR 42 | Ht 68.0 in | Wt 141.4 lb

## 2017-05-31 DIAGNOSIS — R000 Tachycardia, unspecified: Secondary | ICD-10-CM | POA: Insufficient documentation

## 2017-05-31 DIAGNOSIS — I340 Nonrheumatic mitral (valve) insufficiency: Secondary | ICD-10-CM

## 2017-05-31 DIAGNOSIS — E7800 Pure hypercholesterolemia, unspecified: Secondary | ICD-10-CM | POA: Diagnosis not present

## 2017-05-31 DIAGNOSIS — I480 Paroxysmal atrial fibrillation: Secondary | ICD-10-CM

## 2017-05-31 NOTE — Progress Notes (Signed)
Cardiology Office Note:    Date:  05/31/2017   ID:  Paige Klein, DOB [birth year], MRN [id number]  PCP:  [name], MD  Cardiologist:  Donato Schultz, MD   Referring MD: [name], MD     History of Present Illness:    Paige Klein is a 67 y.o. female here for the follow up of AFIB, discovered at visit on 05/22/2017.  Found to be with atrial fibrillation and rapid ventricular response.  She was started on diltiazem CD 120 mg a day and started on Eliquis 5 mg twice a day.  Was normal.  No bleeding.  She did not want to try beta-blocker due to  prior fatigue.  47-55HR is baseline for her. Felt better when going out door. 30 min episodes.   After being on CD 120 of diltiazem, her heart rate currently is 42 bpm.  We decided to stop this.  And utilize 30 mg tablets as needed.  I think she would  benefit from a discussion about ablation.   Chadsvasc score was 2.  In review of her prior office note she described a couple of episodes over the past year of rapid heart rate occurring mostly in the evenings sometimes after a meal, once occurring when she felt dizzy and had gone out to eat after concert.  Does not seem to be related with exertion.  Her pulse may have been approximately 150, one time she used a blood pressure cuff for confirmation.  It seemed to be too fast for her to try to count her pulse.  When she takes a few deep breaths and relaxes, it can go away.  Sometimes it can take up to a half an hour before it resolves.  Hard for her to know whether or not it stopped suddenly but she had not paid much attention to this.    Past Medical History:  Diagnosis Date  . Abnormal Pap smear of cervix 1999   ASGUS x 1  . Allergic rhinitis, cause unspecified    mold; and vasomotor rhinitis  . Cataract   . Fibrocystic breast changes    and also breast calcifications (08/2008)  . Glaucoma   . Glaucoma    followed by [facility] Dr. Lottie Dawson  . Glaucoma suspect of both eyes    high-normal  intraocular pressures (Dr. Emily Filbert)  . H/O bone density study 09/07/09   08/16/07, 08/08/05; osteoporosis  . H/O mammogram 11/23/10   normal  . Hypercholesteremia    borderline per NMR lipoprofile 2008, 2007  . Internal hemorrhoids   . Kidney stone 1986   right  . Migraine headache    resolved with menopause (menstrual migraines)  . MVP (mitral valve prolapse)    per echo  . Osteoporosis   . Pap smear for cervical cancer screening 04/14/09   negative for malignancy, +atrophic changes  . Postmenopausal    on compounded cream daily  . Unspecified vitamin D deficiency 2009    Past Surgical History:  Procedure Laterality Date  . BREAST CYST ASPIRATION     many (Dr. Maryagnes Amos)  . BREAST FIBROADENOMA SURGERY  09-03   Dr.Gooden  . cardiolite (stress test)  11/05   Dr. Ermalinda Memos  . cataract surgery  2010   bilateral  . CERVICAL POLYPECTOMY  2003   treated with cryosurgery  . COLONOSCOPY  08/02/11; 05/2000   Dr. Loreta Ave - rectal polyps 2013  . DILATION AND CURETTAGE OF UTERUS  05/2009   uterine polyp, bleeding  .  endocervical cyst  12/96, 6/97   Dr Chevis PrettyMezer  . EYE SURGERY    . KYPHOPLASTY N/A 04/29/2015   Procedure: KYPHOPLASTY;  Surgeon: Estill BambergMark Dumonski, MD;  Location: [facility] OR;  Service: Orthopedics;  Laterality: N/A;  Lumbar 1 kyphoplasty    Current Medications: Current Meds  Medication Sig  . AMBULATORY NON FORMULARY MEDICATION Apply 1 mL topically 2 (two) times daily. BI-EST(6:4)+PROGEST+TEST 0.3MG  = 2% + 0.025MG  CREAM. 60 QUANTITY.  Marland Kitchen. apixaban (ELIQUIS) 5 MG TABS tablet Take 1 tablet (5 mg total) by mouth 2 (two) times daily.  Marland Kitchen. denosumab (PROLIA) 60 MG/ML SOLN injection Inject 60 mg into the skin every 6 (six) months. Administer in upper arm, thigh, or abdomen  . diltiazem (CARDIZEM) 30 MG tablet Take 1 tablet (30 mg total) by mouth every 6 (six) hours as needed (as needed for rapid heart rate).  . dorzolamide (TRUSOPT) 2 % ophthalmic solution PLACE 1 DROP INTO both EYE(S) THREE TIMES A DAY  .  latanoprost (XALATAN) 0.005 % ophthalmic solution Place 1 drop into both eyes at bedtime.    . LUTEIN PO Take 1 capsule by mouth daily.   . Multiple Vitamins-Minerals (CENTRUM SILVER PO) Take 1 tablet by mouth daily.    . Netarsudil Dimesylate 0.02 % SOLN Place 1 drop into both eyes daily.  . pilocarpine (PILOCAR) 4 % ophthalmic solution Place 1 drop into both eyes 3 (three) times daily.  . [DISCONTINUED] diltiazem (CARDIZEM CD) 120 MG 24 hr capsule Take 1 capsule (120 mg total) by mouth daily.     Allergies:   Brinzolamide-brimonidine; Cortisone; Fosamax [alendronate sodium]; Timolol; and Tramadol   Social History   Socioeconomic History  . Marital status: Divorced    Spouse name: None  . Number of children: 0  . Years of education: None  . Highest education level: None  Social Needs  . Financial resource strain: None  . Food insecurity - worry: None  . Food insecurity - inability: None  . Transportation needs - medical: None  . Transportation needs - non-medical: None  Occupational History  . 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 Smoking status: Never Smoker  . Smokeless tobacco: Never Used  Substance and Sexual Activity  . Alcohol use: Yes    Alcohol/week: 0.0 oz    Comment: a glass of wine if she eats out, up to 2/week  . Drug use: No  . Sexual activity: Not Currently    Partners: Male    Comment: has boyfriend, not currently sexually active  Other Topics Concern  . None  Social History Narrative   Divorced, no children, exercise with yoga, goes to the [employer], school psychologist - retired.   Lives alone, 1 cat     Family History: The patient's family history includes Arthritis in her mother; Cancer in her cousin, maternal grandfather, maternal grandmother, and mother; Celiac disease in her maternal uncle; Diabetes in her maternal aunt; Hypertension in her father and mother; Muscular dystrophy in her  maternal aunt; Other in her father.  ROS:   Please see the history of present illness.     All other systems reviewed and are negative.  EKGs/Labs/Other Studies Reviewed:    The following studies were reviewed today: Prior office notes, lab work, EKG reviewed  Echocardiogram: 05/31/17-preliminarily EF normal, mild mitral regurgitation, mild aortic regurgitation, normal left atrial dimensions.  EKG:  EKG is  ordered today.  The ekg ordered today demonstrates sinus bradycardia  49 with no other abnormalities personally viewed.  Recent Labs: 03/22/2017: ALT 12; BUN 19; Creat 1.05; Hemoglobin 13.2; Platelets 321; Potassium 4.2; Sodium 140; TSH 2.42  Recent Lipid Panel    Component Value Date/Time   CHOL 165 03/04/2015 0001   TRIG 84 03/04/2015 0001   HDL 55 03/04/2015 0001   CHOLHDL 3.0 03/04/2015 0001   VLDL 17 03/04/2015 0001   LDLCALC 93 03/04/2015 0001    Physical Exam:    VS:  BP 120/78   Pulse (!) 42   Ht 5\' 8"  (1.727 m)   Wt 141 lb 6.4 oz (64.1 kg)   LMP 03/20/2000   BMI 21.50 kg/m     Wt Readings from Last 3 Encounters:  05/31/17 141 lb 6.4 oz (64.1 kg)  05/22/17 142 lb (64.4 kg)  05/11/17 142 lb (64.4 kg)     GEN: Well nourished, well developed, in no acute distress  HEENT: normal  Neck: no JVD, carotid bruits, or masses Cardiac: RRR; no murmurs, rubs, or gallops,no edema  Respiratory:  clear to auscultation bilaterally, normal work of breathing GI: soft, nontender, nondistended, + BS MS: no deformity or atrophy  Skin: warm and dry, no rash Neuro:  Alert and Oriented x 3, Strength and sensation are intact Psych: euthymic mood, full affect    ASSESSMENT:    1. Paroxysmal atrial fibrillation (HCC)    PLAN:    In order of problems listed above:  Paroxsymal atrial fibrillation newly discovered at last visit.  -Currently on diltiazem CD 120 which we will stop because of HR 42. Was 122 in AFIB previously. She is symptomatic. Also on Eliquis. Discussed  stroke ramifications with AFIB.  -echocardiogram to ensure proper structure and function. Avoid excessive caffeine, avoid Sudafed.  TSH was normal at 2.4. On diltiazem 30 mg every 6 hours as needed for any tachycardia.  In the past her resting heart rate has been quite slow in fact, she had to stop atenolol eyedrops because of bradycardia.  -Because of her resting bradycardia, difficulty utilizing traditional medications to help prevent her atrial fibrillation, I would like for her to see electrophysiology in consultation to discuss the possibility of atrial fibrillation ablation for her symptomatic atrial fibrillation.       Medication Adjustments/Labs and Tests Ordered: Current medicines are reviewed at length with the patient today.  Concerns regarding medicines are outlined above.  Orders Placed This Encounter  Procedures  . EKG 12-Lead   No orders of the defined types were placed in this encounter.   Signed, Donato Schultz, MD  05/31/2017 12:51 PM    [facility]

## 2017-05-31 NOTE — Patient Instructions (Signed)
Medication Instructions:  Please discontinue your Diltiazem CD 120 mg tablets. You may continue to take the 30 mg tablets as needed. Continue all other medications as listed.  You have been referred to Electrophysiology to discuss ablation (Dr Johney FrameAllred or Camnitz)  Follow-Up: Follow up in 6 months with Dr. Anne FuSkains.  You will receive a letter in the mail 2 months before you are due.  Please call us when you receive this letter to schedule your follow up appointment.  If you need a refill on your cardiac medications before your next appointment, please call your pharmacy.  Thank you for choosing [facility]!!

## 2017-06-05 LAB — HM MAMMOGRAPHY

## 2017-06-15 ENCOUNTER — Ambulatory Visit: Payer: Medicare Other | Admitting: Cardiology

## 2017-06-15 ENCOUNTER — Encounter: Payer: Self-pay | Admitting: Cardiology

## 2017-06-15 VITALS — BP 106/60 | HR 58 | Ht 68.0 in | Wt 140.0 lb

## 2017-06-15 DIAGNOSIS — I480 Paroxysmal atrial fibrillation: Secondary | ICD-10-CM

## 2017-06-15 NOTE — Progress Notes (Signed)
Electrophysiology Office Note   Date:  06/15/2017   ID:  Paige Klein, DOB Mar 10, 1951, MRN [id number]  PCP:  [name], MD  Cardiologist:  Anne Fu Primary Electrophysiologist:  Paige Mikowski [name], MD    Chief Complaint  Patient presents with  . Advice Only    PAF/Discuss ablation     History of Present Illness: Paige Klein is a 67 y.o. female who is being seen today for the evaluation of atrial fibrillation at the request of Donato Schultz. Presenting today for electrophysiology evaluation.  She has a history of atrial fibrillation, hyperlipidemia.  Her atrial fibrillation was diagnosed 05/22/17.  She was found to be in atrial fibrillation with rapid ventricular response.  She was started on diltiazem 120 mg as well as Eliquis.  She did not want to be on beta-blocker due to prior fatigue.  With diltiazem, her heart rate has been low.  She is taking 30 mg tablets as needed as the 120 mg was stopped.  She says that she has atrial fibrillation a few times a year.  It lasts up to 30 minutes at a time.  Today, she denies symptoms of palpitations, chest pain, shortness of breath, orthopnea, PND, lower extremity edema, claudication, dizziness, presyncope, syncope, bleeding, or neurologic sequela. The patient is tolerating medications without difficulties.    Past Medical History:  Diagnosis Date  . Abnormal Pap smear of cervix 1999   ASGUS x 1  . Allergic rhinitis, cause unspecified    mold; and vasomotor rhinitis  . Cataract   . Fibrocystic breast changes    and also breast calcifications (08/2008)  . Glaucoma   . Glaucoma    followed by [facility] Dr. Lottie Dawson  . Glaucoma suspect of both eyes    high-normal intraocular pressures (Dr. Emily Filbert)  . H/O bone density study 09/07/09   08/16/07, 08/08/05; osteoporosis  . H/O mammogram 11/23/10   normal  . Hypercholesteremia    borderline per NMR lipoprofile 2008, 2007  . Internal hemorrhoids   . Kidney stone 1986   right  . Migraine  headache    resolved with menopause (menstrual migraines)  . MVP (mitral valve prolapse)    per echo  . Osteoporosis   . Pap smear for cervical cancer screening 04/14/09   negative for malignancy, +atrophic changes  . Postmenopausal    on compounded cream daily  . Unspecified vitamin D deficiency 2009   Past Surgical History:  Procedure Laterality Date  . BREAST CYST ASPIRATION     many (Dr. Maryagnes Amos)  . BREAST FIBROADENOMA SURGERY  09-03   Dr.Gooden  . cardiolite (stress test)  11/05   Dr. Ermalinda Memos  . cataract surgery  2010   bilateral  . CERVICAL POLYPECTOMY  2003   treated with cryosurgery  . COLONOSCOPY  08/02/11; 05/2000   Dr. Loreta Ave - rectal polyps 2013  . DILATION AND CURETTAGE OF UTERUS  05/2009   uterine polyp, bleeding  . endocervical cyst  12/96, 6/97   Dr Chevis Pretty  . EYE SURGERY    . KYPHOPLASTY N/A 04/29/2015   Procedure: KYPHOPLASTY;  Surgeon: Estill Bamberg, MD;  Location: [facility] OR;  Service: Orthopedics;  Laterality: N/A;  Lumbar 1 kyphoplasty     Current Outpatient Medications  Medication Sig Dispense Refill  . AMBULATORY NON FORMULARY MEDICATION Apply 1 mL topically 2 (two) times daily. BI-EST(6:4)+PROGEST+TEST 0.3MG  = 2% + 0.025MG  CREAM. 60 QUANTITY. 60 mL 1  . apixaban (ELIQUIS) 5 MG TABS tablet Take 1 tablet (5 mg  total) by mouth 2 (two) times daily. 60 tablet 3  . denosumab (PROLIA) 60 MG/ML SOLN injection Inject 60 mg into the skin every 6 (six) months. Administer in upper arm, thigh, or abdomen    . diltiazem (CARDIZEM) 30 MG tablet Take 1 tablet (30 mg total) by mouth every 6 (six) hours as needed (as needed for rapid heart rate). 30 tablet 6  . dorzolamide (TRUSOPT) 2 % ophthalmic solution PLACE 1 DROP INTO both EYE(S) THREE TIMES A DAY  3  . latanoprost (XALATAN) 0.005 % ophthalmic solution Place 1 drop into both eyes at bedtime.      . LUTEIN PO Take 1 capsule by mouth daily.     . Multiple Vitamins-Minerals (CENTRUM SILVER PO) Take 1 tablet by mouth daily.        . Netarsudil Dimesylate 0.02 % SOLN Place 1 drop into both eyes daily.    . pilocarpine (PILOCAR) 4 % ophthalmic solution Place 1 drop into both eyes 3 (three) times daily.     No current facility-administered medications for this visit.     Allergies:   Brinzolamide-brimonidine; Cortisone; Fosamax [alendronate sodium]; Timolol; and Tramadol   Social History:  The patient  reports that she has never smoked. She has never used smokeless tobacco. She reports that she drinks alcohol. She reports that she does not use drugs.   Family History:  The patient's family history includes Arthritis in her mother; Cancer in her cousin, maternal grandfather, maternal grandmother, and mother; Celiac disease in her maternal uncle; Diabetes in her maternal aunt; Hypertension in her father and mother; Muscular dystrophy in her maternal aunt; Other in her father.    ROS:  Please see the history of present illness.   Otherwise, review of systems is positive for none.   All other systems are reviewed and negative.    PHYSICAL EXAM: VS:  BP 106/60   Pulse (!) 58   Ht 5\' 8"  (1.727 m)   Wt 140 lb (63.5 kg)   LMP 03/20/2000   BMI 21.29 kg/m  , BMI Body mass index is 21.29 kg/m. GEN: Well nourished, well developed, in no acute distress  HEENT: normal  Neck: no JVD, carotid bruits, or masses Cardiac: RRR; no murmurs, rubs, or gallops,no edema  Respiratory:  clear to auscultation bilaterally, normal work of breathing GI: soft, nontender, nondistended, + BS MS: no deformity or atrophy  Skin: warm and dry Neuro:  Strength and sensation are intact Psych: euthymic mood, full affect  EKG:  EKG is not ordered today. Personal review of the ekg ordered 05/22/17 shows atrial fibrillation, rate 122  Recent Labs: 03/22/2017: ALT 12; BUN 19; Creat 1.05; Hemoglobin 13.2; Platelets 321; Potassium 4.2; Sodium 140; TSH 2.42    Lipid Panel     Component Value Date/Time   CHOL 165 03/04/2015 0001   TRIG 84 03/04/2015  0001   HDL 55 03/04/2015 0001   CHOLHDL 3.0 03/04/2015 0001   VLDL 17 03/04/2015 0001   LDLCALC 93 03/04/2015 0001     Wt Readings from Last 3 Encounters:  06/15/17 140 lb (63.5 kg)  05/31/17 141 lb 6.4 oz (64.1 kg)  05/22/17 142 lb (64.4 kg)      Other studies Reviewed: Additional studies/ records that were reviewed today include: TTE 05/31/17  Review of the above records today demonstrates:  - Left ventricle: The cavity size was normal. Wall thickness was   normal. Systolic function was normal. The estimated ejection   fraction was in  the range of 60% to 65%. Wall motion was normal;   there were no regional wall motion abnormalities. - Mitral valve: Prolapse, involving the anterior leaflet. There was   mild to moderate regurgitation.   ASSESSMENT AND PLAN:  1.  Paroxysmal atrial fibrillation: Currently on Eliquis and as needed diltiazem.  She had significant bradycardia with long-acting diltiazem.  Her resting bradycardia may make it difficult for medical therapy.  She is having 2-3 episodes of atrial fibrillation a year that of lasted up to 30 minutes at a time.  At this point, I feel that a watchful waiting method is reasonable.  She does not wish to be on antiarrhythmic medications, and her atrial fibrillation burden is not high enough such that I feel like ablation would be beneficial.  I Kismet Facemire see her back in 3 months for a further determination of her symptoms and atrial fibrillation burden.  As her episodes are short in duration, she may be able to come off of her anticoagulation based on new atrial fibrillation guidelines for women with a CHADS2VASc of 1. This patients CHA2DS2-VASc Score and unadjusted Ischemic Stroke Rate (% per year) is equal to 2.2 % stroke rate/year from a score of 2  Above score calculated as 1 point each if present [CHF, HTN, DM, Vascular=MI/PAD/Aortic Plaque, Age if 65-74, or Female] Above score calculated as 2 points each if present [Age > 75, or  Stroke/TIA/TE]  Current medicines are reviewed at length with the patient today.   The patient does not have concerns regarding her medicines.  The following changes were made today:  none  Labs/ tests ordered today include:  No orders of the defined types were placed in this encounter.  Case discussed with primary cardiology  Disposition:   FU with Derric Dealmeida 3 months  Signed, Sheehan Stacey Jorja LoaMartin Croy Drumwright, MD  06/15/2017 2:26 PM     [facility] [address] [place] [address] [phone] (office) [phone] (fax)

## 2017-06-15 NOTE — Patient Instructions (Signed)
Medication Instructions:  Your physician recommends that you continue on your current medications as directed. Please refer to the Current Medication list given to you today.  Labwork: None ordered  Testing/Procedures: None ordered  Follow-Up: Your physician recommends that you schedule a follow-up appointment in: 3 months with Dr. Camnitz.  * If you need a refill on your cardiac medications before your next appointment, please call your pharmacy.   *Please note that any paperwork needing to be filled out by the provider will need to be addressed at the front desk prior to seeing the provider. Please note that any FMLA, disability or other documents regarding health condition is subject to a $25.00 charge that must be received prior to completion of paperwork in the form of a money order or check.  Thank you for choosing [facility]!!   Nadia Torr, RN [phone]        

## 2017-06-19 ENCOUNTER — Other Ambulatory Visit: Payer: Self-pay | Admitting: Interventional Cardiology

## 2017-07-17 ENCOUNTER — Other Ambulatory Visit (INDEPENDENT_AMBULATORY_CARE_PROVIDER_SITE_OTHER): Payer: Medicare Other

## 2017-07-17 DIAGNOSIS — M810 Age-related osteoporosis without current pathological fracture: Secondary | ICD-10-CM

## 2017-07-17 MED ORDER — DENOSUMAB 60 MG/ML ~~LOC~~ SOSY
60.0000 mg | PREFILLED_SYRINGE | Freq: Once | SUBCUTANEOUS | Status: AC
  Administered 2017-07-17: 60 mg via SUBCUTANEOUS

## 2017-08-14 ENCOUNTER — Telehealth: Payer: Self-pay | Admitting: Cardiology

## 2017-08-14 NOTE — Telephone Encounter (Signed)
New Message   Pt c/o medication issue:  1. Name of Medication: eliquis  2. How are you currently taking this medication (dosage and times per day)?  2x a day  3. Are you having a reaction (difficulty breathing--STAT)? no  4. What is your medication issue? She just started this in march and wants to know if she can stop it for dental appt  1. What dental office are you calling from? Patient called this in /Dr Cleotis Nipper   2. What is your office phone number? [phone]   3. What is your fax number -patient didn't have   4. What type of procedure is the patient having performed? Routine cleaning   5. What date is procedure scheduled or is the patient there now? June 4th   6. What is your question (ex. Antibiotics prior to procedure, holding medication-we need to know how long dentist wants pt to hold med)? eliquis

## 2017-08-15 NOTE — Telephone Encounter (Signed)
   Primary Cardiologist: [name], MD  Chart reviewed as part of pre-operative protocol coverage.   Callback staff, please call patient. Dental cleanings do not typically require holding of anticoagulation. Often times we don't even need to hold blood thinners for simple extractions as well. She should discuss further with her dentist.    Laurann Montana, PA-C 08/15/2017, 2:35 PM

## 2017-08-15 NOTE — Telephone Encounter (Signed)
Pt has been made aware that you don't typically hold anticoags for teeth cleaning. Pt said she called in and was just curious.

## 2017-09-11 ENCOUNTER — Encounter: Payer: Self-pay | Admitting: Cardiology

## 2017-09-11 ENCOUNTER — Ambulatory Visit: Payer: Medicare Other | Admitting: Cardiology

## 2017-09-11 VITALS — BP 120/66 | HR 47 | Ht 68.0 in | Wt 137.8 lb

## 2017-09-11 DIAGNOSIS — I480 Paroxysmal atrial fibrillation: Secondary | ICD-10-CM

## 2017-09-11 NOTE — Progress Notes (Signed)
Electrophysiology Office Note   Date:  09/11/2017   ID:  Paige Klein, DOB 06/15/1950, MRN [id number]  PCP:  Joselyn ArrowKnapp, Eve, MD  Cardiologist:  Anne FuSkains Primary Electrophysiologist:  Xzaviar Maloof Jorja LoaMartin Niyah Mamaril, MD    Chief Complaint  Patient presents with  . Follow-up    PAF     History of Present Illness: Paige Klein is a 67 y.o. female who is being seen today for the evaluation of atrial fibrillation at the request of Donato SchultzMark Skains. Presenting today for electrophysiology evaluation.  She has a history of atrial fibrillation, hyperlipidemia.  Her atrial fibrillation was diagnosed 05/22/17.  She was found to be in atrial fibrillation with rapid ventricular response.  She was started on diltiazem 120 mg as well as Eliquis.  She did not want to be on beta-blocker due to prior fatigue.  With diltiazem, her heart rate has been low.  She is taking 30 mg tablets as needed as the 120 mg was stopped.  She says that she has atrial fibrillation a few times a year.  It lasts up to 30 minutes at a time.  Today, denies symptoms of palpitations, chest pain, shortness of breath, orthopnea, PND, lower extremity edema, claudication, dizziness, presyncope, syncope, bleeding, or neurologic sequela. The patient is tolerating medications without difficulties.  Overall she is feeling well.  She has noted no further episodes of atrial fibrillation since being seen.   Past Medical History:  Diagnosis Date  . A-fib (HCC) 05/22/2017  . Abnormal Pap smear of cervix 1999   ASGUS x 1  . Allergic rhinitis, cause unspecified    mold; and vasomotor rhinitis  . Bradycardia 02/25/2014  . Cataract   . Fibrocystic breast changes    and also breast calcifications (08/2008)  . Glaucoma   . Glaucoma    followed by [facility] Dr. Lottie DawsonBond  . Glaucoma suspect of both eyes    high-normal intraocular pressures (Dr. Emily FilbertGould)  . H/O bone density study 09/07/09   08/16/07, 08/08/05; osteoporosis  . H/O mammogram 11/23/10   normal  .  Hypercholesteremia    borderline per NMR lipoprofile 2008, 2007  . Internal hemorrhoids   . Kidney stone 1986   right  . Migraine headache    resolved with menopause (menstrual migraines)  . MVP (mitral valve prolapse)    per echo  . Osteoporosis   . Osteoporosis 02/22/2012  . Pap smear for cervical cancer screening 04/14/09   negative for malignancy, +atrophic changes  . Postmenopausal    on compounded cream daily  . Unspecified vitamin D deficiency 2009  . Vitamin D deficiency 02/22/2012   Past Surgical History:  Procedure Laterality Date  . BREAST CYST ASPIRATION     many (Dr. Maryagnes AmosLeone)  . BREAST FIBROADENOMA SURGERY  09-03   Dr.Gooden  . cardiolite (stress test)  11/05   Dr. Ermalinda MemosBradshaw  . cataract surgery  2010   bilateral  . CERVICAL POLYPECTOMY  2003   treated with cryosurgery  . COLONOSCOPY  08/02/11; 05/2000   Dr. Loreta AveMann - rectal polyps 2013  . DILATION AND CURETTAGE OF UTERUS  05/2009   uterine polyp, bleeding  . endocervical cyst  12/96, 6/97   Dr Chevis PrettyMezer  . EYE SURGERY    . KYPHOPLASTY N/A 04/29/2015   Procedure: KYPHOPLASTY;  Surgeon: Estill BambergMark Dumonski, MD;  Location: [facility] OR;  Service: Orthopedics;  Laterality: N/A;  Lumbar 1 kyphoplasty     Current Outpatient Medications  Medication Sig Dispense Refill  . AMBULATORY NON FORMULARY  MEDICATION Apply 1 mL topically 2 (two) times daily. BI-EST(6:4)+PROGEST+TEST 0.3MG  = 2% + 0.025MG  CREAM. 60 QUANTITY. 60 mL 1  . apixaban (ELIQUIS) 5 MG TABS tablet Take 1 tablet (5 mg total) by mouth 2 (two) times daily. 60 tablet 3  . denosumab (PROLIA) 60 MG/ML SOLN injection Inject 60 mg into the skin every 6 (six) months. Administer in upper arm, thigh, or abdomen    . diltiazem (CARDIZEM) 30 MG tablet Take 1 tablet (30 mg total) by mouth every 6 (six) hours as needed (as needed for rapid heart rate). 30 tablet 6  . dorzolamide (TRUSOPT) 2 % ophthalmic solution PLACE 1 DROP INTO both EYE(S) THREE TIMES A DAY  3  . latanoprost (XALATAN) 0.005 %  ophthalmic solution Place 1 drop into both eyes at bedtime.      . LUTEIN PO Take 1 capsule by mouth daily.     . Multiple Vitamins-Minerals (CENTRUM SILVER PO) Take 1 tablet by mouth daily.      . Netarsudil Dimesylate 0.02 % SOLN Place 1 drop into both eyes daily.    . pilocarpine (PILOCAR) 4 % ophthalmic solution Place 1 drop into both eyes 3 (three) times daily.     No current facility-administered medications for this visit.     Allergies:   Brinzolamide-brimonidine; Cortisone; Fosamax [alendronate sodium]; Timolol; and Tramadol   Social History:  The patient  reports that she has never smoked. She has never used smokeless tobacco. She reports that she drinks alcohol. She reports that she does not use drugs.   Family History:  The patient's family history includes Arthritis in her mother; Cancer in her cousin, maternal grandfather, maternal grandmother, and mother; Celiac disease in her maternal uncle; Diabetes in her maternal aunt; Hypertension in her father and mother; Muscular dystrophy in her maternal aunt; Other in her father.    ROS:  Please see the history of present illness.   Otherwise, review of systems is positive for none.   All other systems are reviewed and negative.   PHYSICAL EXAM: VS:  BP 120/66   Pulse (!) 47   Ht 5\' 8"  (1.727 m)   Wt 137 lb 12.8 oz (62.5 kg)   LMP 03/20/2000   SpO2 99%   BMI 20.95 kg/m  , BMI Body mass index is 20.95 kg/m. GEN: Well nourished, well developed, in no acute distress  HEENT: normal  Neck: no JVD, carotid bruits, or masses Cardiac: RRR; no murmurs, rubs, or gallops,no edema  Respiratory:  clear to auscultation bilaterally, normal work of breathing GI: soft, nontender, nondistended, + BS MS: no deformity or atrophy  Skin: warm and dry Neuro:  Strength and sensation are intact Psych: euthymic mood, full affect  EKG:  EKG is not ordered today. Personal review of the ekg ordered 05/31/17 shows SR, rate 42   Recent  Labs: 03/22/2017: ALT 12; BUN 19; Creat 1.05; Hemoglobin 13.2; Platelets 321; Potassium 4.2; Sodium 140; TSH 2.42    Lipid Panel     Component Value Date/Time   CHOL 165 03/04/2015 0001   TRIG 84 03/04/2015 0001   HDL 55 03/04/2015 0001   CHOLHDL 3.0 03/04/2015 0001   VLDL 17 03/04/2015 0001   LDLCALC 93 03/04/2015 0001     Wt Readings from Last 3 Encounters:  09/11/17 137 lb 12.8 oz (62.5 kg)  06/15/17 140 lb (63.5 kg)  05/31/17 141 lb 6.4 oz (64.1 kg)      Other studies Reviewed: Additional studies/ records that were  reviewed today include: TTE 05/31/17  Review of the above records today demonstrates:  - Left ventricle: The cavity size was normal. Wall thickness was   normal. Systolic function was normal. The estimated ejection   fraction was in the range of 60% to 65%. Wall motion was normal;   there were no regional wall motion abnormalities. - Mitral valve: Prolapse, involving the anterior leaflet. There was   mild to moderate regurgitation.   ASSESSMENT AND PLAN:  1.  Paroxysmal atrial fibrillation: He on Eliquis and as needed diltiazem.  She does remain bradycardic since stopping her long-acting diltiazem.  She is minimally symptomatic from this.  Her stroke risk is low, so I discussed with her the possibility of coming off of her Eliquis.  She is also having minimal amounts of atrial fibrillation.  Would hold off on therapy as her episodes last for 30 minutes at a time.  Should she have more, she would benefit from likely ablation as she is bradycardic which complicates her therapy.  This patients CHA2DS2-VASc Score and unadjusted Ischemic Stroke Rate (% per year) is equal to 2.2 % stroke rate/year from a score of 2  Above score calculated as 1 point each if present [CHF, HTN, DM, Vascular=MI/PAD/Aortic Plaque, Age if 65-74, or Female] Above score calculated as 2 points each if present [Age > 75, or Stroke/TIA/TE]  Current medicines are reviewed at length with the  patient today.   The patient does not have concerns regarding her medicines.  The following changes were made today: None  Labs/ tests ordered today include:  No orders of the defined types were placed in this encounter.  Disposition:   FU with Taylynn Easton 6 months  Signed, Shanvi Moyd [name], MD  09/11/2017 11:56 AM     [facility] [address] [place] [address] [phone] (office) [phone] (fax)

## 2017-09-11 NOTE — Patient Instructions (Signed)
Medication Instructions: Your physician recommends that you continue on your current medications as directed. Please refer to the Current Medication list given to you today.   Labwork: None ordered   Procedures/Testing: None ordered   Follow-Up: Your physician recommends that you schedule a follow-up appointment in: 6 months with Dr. Elberta Fortisamnitz.     If you need a refill on your cardiac medications before your next appointment, please call your pharmacy.

## 2017-10-06 ENCOUNTER — Other Ambulatory Visit: Payer: Self-pay | Admitting: Interventional Cardiology

## 2017-12-21 ENCOUNTER — Telehealth: Payer: Self-pay | Admitting: Family Medicine

## 2017-12-21 NOTE — Telephone Encounter (Signed)
I'm so sorry! Not sure how that happened that she didn't get the results. The tests were done 02/2016, and she was here for a physical 03/2017 (a year later), and didn't ask or mention it.  Please advise patient that it was perfectly NORMAL, and apologize for any delay in getting those to her.  If she is having any issues, she is welcome to schedule an appointment to have them evaluated.

## 2017-12-21 NOTE — Telephone Encounter (Signed)
Pt called to say she never got the results of a blood flow test she had done over a year ago.  Now having lower leg issues and would like to know the results.  Please call pt and advise.

## 2017-12-24 NOTE — Telephone Encounter (Signed)
Patient notified of normal result

## 2018-01-17 ENCOUNTER — Other Ambulatory Visit (INDEPENDENT_AMBULATORY_CARE_PROVIDER_SITE_OTHER): Payer: Medicare Other

## 2018-01-17 DIAGNOSIS — Z23 Encounter for immunization: Secondary | ICD-10-CM | POA: Diagnosis not present

## 2018-01-17 DIAGNOSIS — M810 Age-related osteoporosis without current pathological fracture: Secondary | ICD-10-CM

## 2018-01-17 MED ORDER — DENOSUMAB 60 MG/ML ~~LOC~~ SOSY
60.0000 mg | PREFILLED_SYRINGE | Freq: Once | SUBCUTANEOUS | Status: AC
  Administered 2018-01-17: 60 mg via SUBCUTANEOUS

## 2018-03-19 ENCOUNTER — Encounter: Payer: Self-pay | Admitting: Cardiology

## 2018-03-19 ENCOUNTER — Ambulatory Visit: Payer: Medicare Other | Admitting: Cardiology

## 2018-03-19 VITALS — BP 122/62 | HR 46 | Ht 68.0 in | Wt 143.0 lb

## 2018-03-19 DIAGNOSIS — I480 Paroxysmal atrial fibrillation: Secondary | ICD-10-CM

## 2018-03-19 NOTE — Patient Instructions (Signed)
Medication Instructions:  Your physician recommends that you continue on your current medications as directed. Please refer to the Current Medication list given to you today.  * If you need a refill on your cardiac medications before your next appointment, please call your pharmacy.   Labwork: None ordered *We will only notify you of abnormal results, otherwise continue current treatment plan.  Testing/Procedures: None ordered  Follow-Up: Your physician wants you to follow-up in: 1 year with Dr. Camnitz.  You will receive a reminder letter in the mail two months in advance. If you don't receive a letter, please call our office to schedule the follow-up appointment.   Thank you for choosing [facility]!!   Lindey Renzulli, RN [phone]       

## 2018-03-19 NOTE — Progress Notes (Signed)
Electrophysiology Office Note   Date:  03/19/2018   ID:  Paige Klein, DOB 09/27/1950, MRN [id number]  PCP:  Joselyn ArrowKnapp, Eve, MD  Cardiologist:  Anne FuSkains Primary Electrophysiologist:  Dr Elberta Fortisamnitz    CC: Follow up for paroxysmal atrial fibrillation   History of Present Illness: Paige Klein is a 67 y.o. female who is being seen today for the evaluation of atrial fibrillation at the request of Donato SchultzMark Skains. Presenting today for electrophysiology evaluation.  She has a history of atrial fibrillation, hyperlipidemia.  Her atrial fibrillation was diagnosed 05/22/17.  She was found to be in atrial fibrillation with rapid ventricular response.  She was started on diltiazem 120 mg as well as Eliquis.  She did not want to be on beta-blocker due to prior fatigue.  With diltiazem, her heart rate has been low.  She is taking 30 mg tablets as needed as the 120 mg was stopped.    She reports that she has not had any heart racing symptoms since her last visit. She does have palpitations rarely but her rate is slow and regular when she checks. No sustained episodes. She reports that she stopped her Eliquis 2/2 purpura on her legs.  Today, denies symptoms of chest pain, shortness of breath, orthopnea, PND, lower extremity edema, claudication, dizziness, presyncope, syncope, bleeding, or neurologic sequela. The patient is tolerating medications without difficulties.     Past Medical History:  Diagnosis Date  . A-fib (HCC) 05/22/2017  . Abnormal Pap smear of cervix 1999   ASGUS x 1  . Allergic rhinitis, cause unspecified    mold; and vasomotor rhinitis  . Bradycardia 02/25/2014  . Cataract   . Fibrocystic breast changes    and also breast calcifications (08/2008)  . Glaucoma   . Glaucoma    followed by [facility] Dr. Lottie DawsonBond  . Glaucoma suspect of both eyes    high-normal intraocular pressures (Dr. Emily FilbertGould)  . H/O bone density study 09/07/09   08/16/07, 08/08/05; osteoporosis  . H/O mammogram 11/23/10   normal  . Hypercholesteremia    borderline per NMR lipoprofile 2008, 2007  . Internal hemorrhoids   . Kidney stone 1986   right  . Migraine headache    resolved with menopause (menstrual migraines)  . MVP (mitral valve prolapse)    per echo  . Osteoporosis   . Osteoporosis 02/22/2012  . Pap smear for cervical cancer screening 04/14/09   negative for malignancy, +atrophic changes  . Postmenopausal    on compounded cream daily  . Unspecified vitamin D deficiency 2009  . Vitamin D deficiency 02/22/2012   Past Surgical History:  Procedure Laterality Date  . BREAST CYST ASPIRATION     many (Dr. Maryagnes AmosLeone)  . BREAST FIBROADENOMA SURGERY  09-03   Dr.Gooden  . cardiolite (stress test)  11/05   Dr. Ermalinda MemosBradshaw  . cataract surgery  2010   bilateral  . CERVICAL POLYPECTOMY  2003   treated with cryosurgery  . COLONOSCOPY  08/02/11; 05/2000   Dr. Loreta AveMann - rectal polyps 2013  . DILATION AND CURETTAGE OF UTERUS  05/2009   uterine polyp, bleeding  . endocervical cyst  12/96, 6/97   Dr Chevis PrettyMezer  . EYE SURGERY    . KYPHOPLASTY N/A 04/29/2015   Procedure: KYPHOPLASTY;  Surgeon: Estill BambergMark Dumonski, MD;  Location: [facility] OR;  Service: Orthopedics;  Laterality: N/A;  Lumbar 1 kyphoplasty     Current Outpatient Medications  Medication Sig Dispense Refill  . AMBULATORY NON FORMULARY MEDICATION Apply 1 mL  topically 2 (two) times daily. BI-EST(6:4)+PROGEST+TEST 0.3MG  = 2% + 0.025MG  CREAM. 60 QUANTITY. 60 mL 1  . denosumab (PROLIA) 60 MG/ML SOLN injection Inject 60 mg into the skin every 6 (six) months. Administer in upper arm, thigh, or abdomen    . dorzolamide (TRUSOPT) 2 % ophthalmic solution PLACE 1 DROP INTO both EYE(S) THREE TIMES A DAY  3  . ELIQUIS 5 MG TABS tablet TAKE 1 TABLET BY MOUTH TWICE A DAY 60 tablet 5  . latanoprost (XALATAN) 0.005 % ophthalmic solution Place 1 drop into both eyes at bedtime.      . LUTEIN PO Take 1 capsule by mouth daily.     . Multiple Vitamins-Minerals (CENTRUM SILVER PO) Take 1  tablet by mouth daily.      . Netarsudil Dimesylate 0.02 % SOLN Place 1 drop into both eyes daily.    . pilocarpine (PILOCAR) 4 % ophthalmic solution Place 1 drop into both eyes 3 (three) times daily.    Marland Kitchen. diltiazem (CARDIZEM) 30 MG tablet Take 1 tablet (30 mg total) by mouth every 6 (six) hours as needed (as needed for rapid heart rate). (Patient not taking: Reported on 03/19/2018) 30 tablet 6   No current facility-administered medications for this visit.     Allergies:   Brinzolamide-brimonidine; Cortisone; Fosamax [alendronate sodium]; Timolol; and Tramadol   Social History:  The patient  reports that she has never smoked. She has never used smokeless tobacco. She reports current alcohol use. She reports that she does not use drugs.   Family History:  The patient's family history includes Arthritis in her mother; Cancer in her cousin, maternal grandfather, maternal grandmother, and mother; Celiac disease in her maternal uncle; Diabetes in her maternal aunt; Hypertension in her father and mother; Muscular dystrophy in her maternal aunt; Other in her father.    ROS:  Please see the history of present illness.   Otherwise, review of systems is positive for visual problems (gluacoma).   All other systems are reviewed and negative.   PHYSICAL EXAM: VS:  BP 122/62   Pulse (!) 46   Ht 5\' 8"  (1.727 m)   Wt 143 lb (64.9 kg)   LMP 03/20/2000   BMI 21.74 kg/m  , BMI Body mass index is 21.74 kg/m. GEN: Well nourished, well developed, in no acute distress  HEENT: normal  Neck: no JVD, carotid bruits, or masses Cardiac: RRR, bradycardic; no murmurs, rubs, or gallops,no edema  Respiratory:  clear to auscultation bilaterally, normal work of breathing GI: soft, nontender, nondistended, + BS MS: no deformity or atrophy  Skin: warm and dry Neuro:  Strength and sensation are intact Psych: euthymic mood, full affect  EKG:  EKG is ordered today. Personal review of the ekg ordered shows sinus  bradycardia HR 46, PR 152, QRS 72, Qtc    Recent Labs: 03/22/2017: ALT 12; BUN 19; Creat 1.05; Hemoglobin 13.2; Platelets 321; Potassium 4.2; Sodium 140; TSH 2.42    Lipid Panel     Component Value Date/Time   CHOL 165 03/04/2015 0001   TRIG 84 03/04/2015 0001   HDL 55 03/04/2015 0001   CHOLHDL 3.0 03/04/2015 0001   VLDL 17 03/04/2015 0001   LDLCALC 93 03/04/2015 0001     Wt Readings from Last 3 Encounters:  03/19/18 143 lb (64.9 kg)  09/11/17 137 lb 12.8 oz (62.5 kg)  06/15/17 140 lb (63.5 kg)      Other studies Reviewed: Additional studies/ records that were reviewed today include: TTE  05/31/17  Review of the above records today demonstrates:  - Left ventricle: The cavity size was normal. Wall thickness was   normal. Systolic function was normal. The estimated ejection   fraction was in the range of 60% to 65%. Wall motion was normal;   there were no regional wall motion abnormalities. - Mitral valve: Prolapse, involving the anterior leaflet. There was   mild to moderate regurgitation.   ASSESSMENT AND PLAN:  1.  Paroxysmal atrial fibrillation: She is on PRN diltiazem.  She does remain bradycardic since stopping her long-acting diltiazem. She has had no heart racing symptoms since her last visit. Should she have more, she would benefit from likely ablation as she is bradycardic which complicates her therapy.  We discussed her Eliquis. Her CHADS2VASC score is 2 (age, female). Per the updated guidelines, it is reasonable for her to stop anticoagulation. We did discuss her risk of stroke and she had decided to stop at this time. If she has more frequent episodes of afib, we may need to restart.  This patients CHA2DS2-VASc Score and unadjusted Ischemic Stroke Rate (% per year) is equal to 2.2 % stroke rate/year from a score of 2  Above score calculated as 1 point each if present [CHF, HTN, DM, Vascular=MI/PAD/Aortic Plaque, Age if 65-74, or Female] Above score calculated as 2  points each if present [Age > 75, or Stroke/TIA/TE]   Current medicines are reviewed at length with the patient today.   The patient does not have concerns regarding her medicines.  The following changes were made today: stop Eliquis  Labs/ tests ordered today include:  Orders Placed This Encounter  Procedures  . EKG 12-Lead   Disposition:   FU with   12 months. Patient agrees to contact us with issues in the interim.   Signed,  [name], MD  03/19/2018 12:11 PM     [facility] [address] [place] [address] [phone] (office) [phone] (fax)  I have seen and examined this patient with [name].  Agree with above, note added to reflect my findings.  On exam, RRR, no murmurs, lungs clear.  Has not had any further episodes of atrial fibrillation since being seen in clinic last.  She has stopped her Eliquis.  At this point, would continue with current management.  I  see her back in 1 year.  If she has more episodes of atrial fibrillation she  call us in the interim.  I have also told her that if she has more episodes atrial fibrillation, that she would need to take her Eliquis.  She is certainly aware of her AF episodes.   M.  MD 03/19/2018 12:11 PM

## 2018-03-24 NOTE — Progress Notes (Signed)
Chief Complaint  Patient presents with  . Medicare Wellness    fasting AWV/CPE with pap. Sees eye doctor. No new concerns.     Paige Klein is a 68 y.o. female who presents for annual wellness visit and follow-up on chronic medical conditions.   Paroxysmal atrial fibrillation: Diagnosed 05/22/17.  Denies any tachycardia, has some palpitations.  She was started on diltiazem 120 mg as well as Eliquis.  Her diltiazem was changed to 94m prn (due to bradycardia).  She stopped Eliquis due to purpura on her legs. She last saw Dr. CCheryln Manly12/31/19. CHADS2VASC score is 2 (age, female), and he felt she could safely be off the Eliquis.  She is bradycardic at baseline, and would be a candidate for ablation if having significant afib.   Osteoporosis: She continues on Prolia q6 months without side effects. This was started in 05/2015, last injection was 01/17/18.  She is s/p kyphoplasty 04/2015. She previously took Actonel x 6 years, changed to Fosamax (insurance reasons), stopped in 11/2011. She noticed improvement in bone pains/aches and hip pain since stopping alendronate in 2013.  (she now reports she still has some aches, has seen [facility], and thinks she has a hip bursitis that may have contributed to her symptoms). Last DEXA was 05/2016--still showed osteoporosis in the spine, but had improved (T went from -2.8 to -2.5).  Left hip bursitis--seeing [facility]. She had a cortisone injection prior to a trip.  It is somewhat better, sometimes affects her especially her sleep. Pain sometimes radiates down her thigh.  Postmenopausal with atrophic vaginitis, prescribed compounded progesterone (bi-est (6:4)+progest+test 0.35m2%+0.025mg 1cc topically)--she admits to forgetting to use it frequently, not in many weeks.  She really hasn't had many symptoms recently, as she is not in a sexual relationship. Rare hot flash when she wakes up. Her energy seems better when she uses it regularly.  Hasn't had  the same anxiety recently as she used to when she wasn't using it in the past.  Immunization History  Administered Date(s) Administered  . Influenza Split 03/21/2007, 03/20/2008, 01/19/2011  . Influenza, High Dose Seasonal PF 03/04/2015, 03/09/2016, 01/15/2017, 01/17/2018  . Influenza, Seasonal, Injecte, Preservative Fre 02/21/2012  . Influenza,inj,Quad PF,6+ Mos 02/24/2013, 02/25/2014  . PPD Test 02/11/1997  . Pneumococcal Conjugate-13 03/04/2015  . Pneumococcal Polysaccharide-23 03/09/2016  . Td 10/04/1990, 03/18/2001  . Tdap 04/13/2009  . Zoster 08/20/2012   Last Pap smear: 02/2013--normal with no high risk HPV [facility]  Last colonoscopy: 07/2011 Dr. MaCollene Mareshyperplastic polyps (10 yr f/u recommended) Last DEXA: 05/2016 (Prolia started 05/2015); T-2.5 at spine (improved from T-2.8 in 04/2014) Ophtho: every 6 months (glaucoma) or more frequently (due to changing meds) Dentist: regularly, every 6 months Exercise: limited some by her left hip pain.  Does yoga oncea week (at home); [facility]. Sometimes does stretching routines from [employer], just a few are cardio. Vitamin D level was normal twice in the paston current vitamins(last was 34 in 02/2014). Lipids: Lab Results  Component Value Date   CHOL 165 03/04/2015   HDL 55 03/04/2015   LDLCALC 93 03/04/2015   TRIG 84 03/04/2015   CHOLHDL 3.0 03/04/2015   Other doctors caring for patient include: GI: Dr. MaCollene Maresphtho: Dr. GoMliss Fritzhe past, now seeing specialist) [facility]entist: Dr. HaJohnnye SimaYN: Dr. MeCarren Rangretired) Cardiologist: Dr. CaCheryln Manlyprev SkMarlou PorchDerm: Dr. GoDelman CheadleDepression screen: Negative Fall screen: 2,  both of which were vision related. No injuries (once tripped over a bumper in parking lot, once last week missed a step in someone's house, recessed living room). Functional status screen--notable  forvision issues (glaucoma, sees ophtho),occasional urge/stress incontinence. slight decline in hearing. No longer drives at night Mini-Cog screen: Normal See full screens in epic.  End of Life Discussion: Patienthasa living will and medical power of attorney  Past Medical History:  Diagnosis Date  . A-fib ([place]) 05/22/2017  . Abnormal Pap smear of cervix 1999   ASGUS x 1  . Allergic rhinitis, cause unspecified    mold; and vasomotor rhinitis  . Bradycardia 02/25/2014  . Cataract   . Fibrocystic breast changes    and also breast calcifications (08/2008)  . Glaucoma   . Glaucoma    followed by [facility] Dr. Edilia Bo  . Glaucoma suspect of both eyes    high-normal intraocular pressures (Dr. Delman Cheadle)  . H/O bone density study 09/07/09   08/16/07, 08/08/05; osteoporosis  . H/O mammogram 11/23/10   normal  . Hypercholesteremia    borderline per NMR lipoprofile 2008, 2007  . Internal hemorrhoids   . Kidney stone 1986   right  . Migraine headache    resolved with menopause (menstrual migraines)  . MVP (mitral valve prolapse)    per echo  . Osteoporosis   . Osteoporosis 02/22/2012  . Pap smear for cervical cancer screening 04/14/09   negative for malignancy, +atrophic changes  . Postmenopausal    on compounded cream daily  . Unspecified vitamin D deficiency 2009  . Vitamin D deficiency 02/22/2012    Past Surgical History:  Procedure Laterality Date  . BREAST CYST ASPIRATION     many (Dr. Rebekah Chesterfield)  . BREAST FIBROADENOMA SURGERY  09-03   Dr.Gooden  . cardiolite (stress test)  11/05   Dr. Wendi Snipes  . cataract surgery  2010   bilateral  . CERVICAL POLYPECTOMY  2003   treated with cryosurgery  . COLONOSCOPY  08/02/11; 05/2000   Dr. Collene Mares - rectal polyps 2013  . DILATION AND CURETTAGE OF UTERUS  05/2009   uterine polyp, bleeding  . endocervical cyst  12/96, 6/97   Dr Carren Rang  . EYE SURGERY    . KYPHOPLASTY N/A 04/29/2015   Procedure: KYPHOPLASTY;  Surgeon: Phylliss Bob, MD;  Location: [facility];   Service: Orthopedics;  Laterality: N/A;  Lumbar 1 kyphoplasty    Social History   Socioeconomic History  . Marital status: Divorced    Spouse name: Not on file  . Number of children: 0  . Years of education: Not on file  . Highest education level: Not on file  Occupational History  . 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 Financial resource strain: Not on file  . Food insecurity:    Worry: Not on file    Inability: Not on file  . Transportation needs:    Medical: Not on file    Non-medical: Not on file  Tobacco Use  . Smoking status: Never Smoker  . Smokeless tobacco: Never Used  Substance and Sexual Activity  . Alcohol use: Yes    Alcohol/week: 0.0 standard drinks    Comment: a glass of wine if she eats out, up to 2/week  . Drug use: No  . Sexual activity: Not Currently    Partners: Male    Comment: has boyfriend, not currently sexually active  Lifestyle  . Physical activity:  Days per week: Not on file    Minutes per session: Not on file  . Stress: Not on file  Relationships  . Social connections:    Talks on phone: Not on file    Gets together: Not on file    Attends religious service: Not on file    Active member of club or organization: Not on file    Attends meetings of clubs or organizations: Not on file    Relationship status: Not on file  . Intimate partner violence:    Fear of current or ex partner: Not on file    Emotionally abused: Not on file    Physically abused: Not on file    Forced sexual activity: Not on file  Other Topics Concern  . Not on file  Social History Narrative   Divorced, no children, exercise with yoga, goes to the [employer], school psychologist - retired.   Lives alone, 1 cat    Family History  Problem Relation Age of Onset  . Hypertension Mother   . Arthritis Mother   . Cancer Mother        breast cancer in her 7's and 5's (bilateral)  . Hypertension  Father   . Other Father        died of pancreatitis  . Muscular dystrophy Maternal Aunt        adult onset  . Cancer Cousin        mouth (nonsmoker)  . Celiac disease Maternal Uncle   . Cancer Maternal Grandmother        cervical and ovarian  . Cancer Maternal Grandfather        pancreatic  . Diabetes Maternal Aunt     Outpatient Encounter Medications as of 03/25/2018  Medication Sig  . denosumab (PROLIA) 60 MG/ML SOLN injection Inject 60 mg into the skin every 6 (six) months. Administer in upper arm, thigh, or abdomen  . dorzolamide (TRUSOPT) 2 % ophthalmic solution PLACE 1 DROP INTO both EYE(S) THREE TIMES A DAY  . latanoprost (XALATAN) 0.005 % ophthalmic solution Place 1 drop into both eyes at bedtime.    Marland Kitchen loratadine (CLARITIN) 10 MG tablet Take 10 mg by mouth daily.  . LUTEIN PO Take 1 capsule by mouth daily.   . Multiple Vitamins-Minerals (CENTRUM SILVER PO) Take 1 tablet by mouth daily.    . Netarsudil Dimesylate 0.02 % SOLN Place 1 drop into both eyes daily.  . pilocarpine (PILOCAR) 4 % ophthalmic solution Place 1 drop into both eyes 3 (three) times daily.  . AMBULATORY NON FORMULARY MEDICATION Apply 1 mL topically 2 (two) times daily. BI-EST(6:4)+PROGEST+TEST 0.3MG = 2% + 0.025MG CREAM. 60 QUANTITY. (Patient not taking: Reported on 03/25/2018)  . diltiazem (CARDIZEM) 30 MG tablet Take 30 mg by mouth as needed.  Marland Kitchen ELIQUIS 5 MG TABS tablet TAKE 1 TABLET BY MOUTH TWICE A DAY (Patient not taking: Reported on 03/25/2018)  . [DISCONTINUED] diltiazem (CARDIZEM) 30 MG tablet Take 1 tablet (30 mg total) by mouth every 6 (six) hours as needed (as needed for rapid heart rate). (Patient not taking: Reported on 03/19/2018)   No facility-administered encounter medications on file as of 03/25/2018.     Allergies  Allergen Reactions  . Brinzolamide-Brimonidine Itching  . Cortisone Other (See Comments)    Entire body hurt from a cortisone shot.  . Fosamax [Alendronate Sodium]     GI upset,  bone pain; tolerated Actonel  . Timolol Other (See Comments)    Slowed her heart  rate down  . Tramadol Rash    ROS: The patient denies anorexia, fever, weight changes, headaches, decreased hearing (just mild), ear pain, sore throat, breast concerns, chest pain, palpitations, dizziness, syncope, dyspnea on exertion,cough, swelling, nausea, vomiting, abdominal pain,diarrhea,melena, hematochezia, indigestion/heartburn(just occasional heartburn), hematuria,dysuria, postmenopausal bleeding, vaginal discharge, odor or itch, genital lesions, weakness, tremor, suspicious skin lesions, depression, anxiety, abnormal bleeding/bruising, or enlarged lymph nodes. Some urinary urgency, only rare leakage (on the way to the bathroom);slight urgency/incontinence when she has colds/cough. Some chronic runny nose and eyes--improved since using claritin and OTC allergy eye drops No longer having diarrhea related to her eye drops. +vision loss--visual field loss from glaucoma;no longer driving at night), Under close monitoring of her pressures from the ophtho. Occasional palpitation at night (very short-lived, feels like heart is beating hard, not fast). Bruising improved after stopping Eliquis.  Denies bleeding/bruising now.   PHYSICAL EXAM:  BP (!) 148/80   Pulse (!) 52   Ht 5' 7"  (1.702 m)   Wt 143 lb 6.4 oz (65 kg)   LMP 03/20/2000   BMI 22.46 kg/m   Wt Readings from Last 3 Encounters:  03/25/18 143 lb 6.4 oz (65 kg)  03/19/18 143 lb (64.9 kg)  09/11/17 137 lb 12.8 oz (62.5 kg)   BP 136/68 on repeat by MD  General Appearance:  Alert, cooperative, no distress, appears her age  Head:  Normocephalic, without obvious abnormality, atraumatic   Eyes:  Miotic pupils, fundi not visualized; conjunctive clear;EOM's intact  Ears:  Normal TM's and external ear canals   Nose:  Nares normal, mucosa is normal, no erythema, no drainage or sinus tenderness.  Throat:  Lips, mucosa, and  tongue normal; teeth and gums normal   Neck:  Supple, no lymphadenopathy; thyroid: no enlargement/tenderness/nodules; no carotid bruit or JVD   Back:  Spine nontender, no curvature, ROM normal, no CVA tenderness   Lungs:  Clear to auscultation bilaterally without wheezes, rales or rhonchi; respirations unlabored   Chest Wall:  No tenderness or deformity   Heart:  Regular Rhythm, bradycardic(50's); S1 and S2 normal, no murmur, rub or gallop.   Breast Exam:  No tenderness, masses, or nipple discharge or inversion. No axillary lymphadenopathy   Abdomen:  Soft, non-tender, nondistended, normoactive bowel sounds, no masses, no hepatosplenomegaly   Genitalia:  Normal external genitalia without lesions. + atrophic changes. No cervical lesions or cervical motion tenderness. Nulliparous cervix. Uterus and adnexa not enlarged, nontender, no masses. Pap performed   Rectal:  Normal tone, no masses or tenderness; guaiac negative stool   Extremities:  No clubbing, cyanosis. Trace pitting edema at left ankle  Pulses:  2+dorsalis pedis and PT pulses  Skin:  Skin color, texture, turgor normal. Hyperpigmented patches at the anteromedial left lower leg, unchanged.  Dermatofibroma also noted left medial calf. Discolored lesion above the left medial malleolus is unchanged.  Lymph nodes:  Cervical, supraclavicular, and axillary nodes normal   Neurologic:  CNII-XII intact, normal strength, sensation and gait; reflexes 2+ and symmetric throughout    Psych: Normal mood, affect, hygiene and grooming   ASSESSMENT/PLAN:  Annual physical exam - Plan: POCT Urinalysis DIP (Proadvantage Device), Comprehensive metabolic panel, Lipid panel, CBC with Differential/Platelet, Cytology - PAP([facility])  Medicare annual wellness visit, subsequent  Osteoporosis without current pathological fracture, unspecified osteoporosis type - continue prolia q 63mo.  Discussed Ca, D and weight-bearing exercise  Post-menopausal atrophic vaginitis - minimally symptomatic. doesn't need to continue hormonal creams if not needed  Bradycardia - asymptomatic  Paroxysmal atrial fibrillation (HCC) - continue diltiazem prn, and to f/u with cardiologist if recurrent tachycardia, frequent palpitations. - Plan: Comprehensive metabolic panel, Lipid panel, CBC with Differential/Platelet  Pap smear c-met, CBC, lipid  [facility]/2018  Discussed monthly self breast exams and yearly mammograms; at least 30 minutes of aerobic activity at least 5 days/week, weight-bearing exercise 2x/week; proper sunscreen use reviewed; healthy diet, including goals of calcium and vitamin D intake and alcohol recommendations (less than or equal to 1 drink/day) reviewed; regular seatbelt use; changing batteries in smoke detectors. Immunization recommendations discussed--Continue high dose flu shots yearly. Shingrix recommended.Risks/side effects reviewed, to get at pharmacy.Colonoscopy recommendations reviewed--UTD, due again 07/2021 DEXA due 05/2018  Full Code, Full Care MOST form reviewed and updated.   Medicare Attestation I have personally reviewed: The patient's medical and social history Their use of alcohol, tobacco or illicit drugs Their current medications and supplements The patient's functional ability including ADLs,fall risks, home safety risks, cognitive, and hearing and visual impairment Diet and physical activities Evidence for depression or mood disorders  The patient's weight, height and BMI have been recorded in the chart.  I have made referrals, counseling, and provided education to the patient based on review of the above and I have provided the patient with a written personalized care plan for preventive services.

## 2018-03-24 NOTE — Patient Instructions (Addendum)
HEALTH MAINTENANCE RECOMMENDATIONS:  It is recommended that you get at least 30 minutes of aerobic exercise at least 5 days/week (for weight loss, you may need as much as 60-90 minutes). This can be any activity that gets your heart rate up. This can be divided in 10-15 minute intervals if needed, but try and build up your endurance at least once a week.  Weight bearing exercise is also recommended twice weekly.  Eat a healthy diet with lots of vegetables, fruits and fiber.  "Colorful" foods have a lot of vitamins (ie green vegetables, tomatoes, red peppers, etc).  Limit sweet tea, regular sodas and alcoholic beverages, all of which has a lot of calories and sugar.  Up to 1 alcoholic drink daily may be beneficial for women (unless trying to lose weight, watch sugars).  Drink a lot of water.  Calcium recommendations are 1200-1500 mg daily (1500 mg for postmenopausal women or women without ovaries), and vitamin D 1000 IU daily.  This should be obtained from diet and/or supplements (vitamins), and calcium should not be taken all at once, but in divided doses.  Monthly self breast exams and yearly mammograms for women over the age of 29 is recommended.  Sunscreen of at least SPF 30 should be used on all sun-exposed parts of the skin when outside between the hours of 10 am and 4 pm (not just when at beach or pool, but even with exercise, golf, tennis, and yard work!)  Use a sunscreen that says "broad spectrum" so it covers both UVA and UVB rays, and make sure to reapply every 1-2 hours.  Remember to change the batteries in your smoke detectors when changing your clock times in the spring and fall.  Use your seat belt every time you are in a car, and please drive safely and not be distracted with cell phones and texting while driving.   Paige Klein , Thank you for taking time to come for your Medicare Wellness Visit. I appreciate your ongoing commitment to your health goals. Please review the  following plan we discussed and let me know if I can assist you in the future.    This is a list of the screening recommended for you and due dates:  Health Maintenance  Topic Date Due  . Mammogram  06/01/2018  . Tetanus Vaccine  04/14/2019  . Colon Cancer Screening  08/01/2021  . Flu Shot  Completed  . DEXA scan (bone density measurement)  Completed  .  Hepatitis C: One time screening is recommended by Center for Disease Control  (CDC) for  adults born from [birth year] through 1966.   Completed  . Pneumonia vaccines  Completed   Above mammogram date is incorrect. They are recommended yearly (the date above is 2 years from the last mammogram result we have). Next due in March. Next Bone Density test is due 05/2018.  Please call [facility] to schedule this, along with your mammogram (they will fax me an order to sign).  I recommend getting the new shingles vaccine (Shingrix). You will need to check with your insurance to see if it is covered, and if covered by Medicare Part D, you need to get from the pharmacy rather than our office.  It is a series of 2 injections, spaced 2 months apart.  Continue Prolia injections every 6 months.  Periodically check BP elsewhere, to ensure it isn't high.  Feel free to schedule a nurse visit to have the accuracy of your monitor verified. Try and  follow a low sodium diet, and exercise daily as we discussed (which helps lower blood pressure).  Compression stockings are a good idea (when standing/seated for a long time)

## 2018-03-25 ENCOUNTER — Encounter: Payer: Self-pay | Admitting: Family Medicine

## 2018-03-25 ENCOUNTER — Other Ambulatory Visit (HOSPITAL_COMMUNITY)
Admission: RE | Admit: 2018-03-25 | Discharge: 2018-03-25 | Disposition: A | Discharge status: Home / Self Care | Payer: Medicare Other | Source: Ambulatory Visit | Attending: Family Medicine | Admitting: Family Medicine

## 2018-03-25 ENCOUNTER — Ambulatory Visit: Payer: Medicare Other | Admitting: Family Medicine

## 2018-03-25 VITALS — BP 136/68 | HR 52 | Ht 67.0 in | Wt 143.4 lb

## 2018-03-25 DIAGNOSIS — R001 Bradycardia, unspecified: Secondary | ICD-10-CM

## 2018-03-25 DIAGNOSIS — Z0000 Encounter for general adult medical examination without abnormal findings: Secondary | ICD-10-CM

## 2018-03-25 DIAGNOSIS — M810 Age-related osteoporosis without current pathological fracture: Secondary | ICD-10-CM | POA: Diagnosis not present

## 2018-03-25 DIAGNOSIS — N952 Postmenopausal atrophic vaginitis: Secondary | ICD-10-CM | POA: Diagnosis not present

## 2018-03-25 DIAGNOSIS — I480 Paroxysmal atrial fibrillation: Secondary | ICD-10-CM

## 2018-03-25 LAB — POCT URINALYSIS DIP (PROADVANTAGE DEVICE)
Bilirubin, UA: NEGATIVE
Glucose, UA: NEGATIVE mg/dL
Ketones, POC UA: NEGATIVE mg/dL
Leukocytes, UA: NEGATIVE
Nitrite, UA: NEGATIVE
Protein Ur, POC: NEGATIVE mg/dL
Specific Gravity, Urine: 1.03
Urobilinogen, Ur: NEGATIVE
pH, UA: 6 (ref 5.0–8.0)

## 2018-03-26 LAB — LIPID PANEL
CHOL/HDL RATIO: 3.5 ratio (ref 0.0–4.4)
Cholesterol, Total: 244 mg/dL — ABNORMAL HIGH (ref 100–199)
HDL: 70 mg/dL (ref >39)
LDL Calculated: 156 mg/dL — ABNORMAL HIGH (ref 0–99)
Triglycerides: 88 mg/dL (ref 0–149)
VLDL Cholesterol Cal: 18 mg/dL (ref 5–40)

## 2018-03-26 LAB — CBC WITH DIFFERENTIAL/PLATELET
Basophils Absolute: 0.1 10*3/uL (ref 0.0–0.2)
Basos: 1 %
EOS (ABSOLUTE): 0.1 10*3/uL (ref 0.0–0.4)
Eos: 1 %
Hematocrit: 38.8 % (ref 34.0–46.6)
Hemoglobin: 13.1 g/dL (ref 11.1–15.9)
Immature Grans (Abs): 0 10*3/uL (ref 0.0–0.1)
Immature Granulocytes: 0 %
LYMPHS: 23 %
Lymphocytes Absolute: 1.5 10*3/uL (ref 0.7–3.1)
MCH: 31 pg (ref 26.6–33.0)
MCHC: 33.8 g/dL (ref 31.5–35.7)
MCV: 92 fL (ref 79–97)
Monocytes Absolute: 0.7 10*3/uL (ref 0.1–0.9)
Monocytes: 10 %
Neutrophils Absolute: 4.1 10*3/uL (ref 1.4–7.0)
Neutrophils: 65 %
PLATELETS: 339 10*3/uL (ref 150–450)
RBC: 4.22 x10E6/uL (ref 3.77–5.28)
RDW: 12.3 % (ref 11.7–15.4)
WBC: 6.5 10*3/uL (ref 3.4–10.8)

## 2018-03-26 LAB — COMPREHENSIVE METABOLIC PANEL
ALT: 14 IU/L (ref 0–32)
AST: 19 IU/L (ref 0–40)
Albumin/Globulin Ratio: 1.8 (ref 1.2–2.2)
Albumin: 4.4 g/dL (ref 3.6–4.8)
Alkaline Phosphatase: 45 IU/L (ref 39–117)
BUN/Creatinine Ratio: 28 (ref 12–28)
BUN: 27 mg/dL (ref 8–27)
Bilirubin Total: <0.2 mg/dL (ref 0.0–1.2)
CALCIUM: 10.3 mg/dL (ref 8.7–10.3)
CO2: 24 mmol/L (ref 20–29)
Chloride: 104 mmol/L (ref 96–106)
Creatinine, Ser: 0.95 mg/dL (ref 0.57–1.00)
GFR calc Af Amer: 71 mL/min/{1.73_m2} (ref >59)
GFR calc non Af Amer: 62 mL/min/{1.73_m2} (ref >59)
Globulin, Total: 2.4 g/dL (ref 1.5–4.5)
Glucose: 93 mg/dL (ref 65–99)
Potassium: 5 mmol/L (ref 3.5–5.2)
Sodium: 142 mmol/L (ref 134–144)
Total Protein: 6.8 g/dL (ref 6.0–8.5)

## 2018-03-27 LAB — CYTOLOGY - PAP
Diagnosis: NEGATIVE
HPV: NOT DETECTED

## 2018-04-01 ENCOUNTER — Encounter: Payer: Self-pay | Admitting: *Deleted

## 2018-07-11 DIAGNOSIS — H10433 Chronic follicular conjunctivitis, bilateral: Secondary | ICD-10-CM | POA: Insufficient documentation

## 2018-07-22 ENCOUNTER — Other Ambulatory Visit: Payer: Self-pay

## 2018-07-22 ENCOUNTER — Other Ambulatory Visit: Payer: Medicare Other

## 2018-07-22 DIAGNOSIS — M810 Age-related osteoporosis without current pathological fracture: Secondary | ICD-10-CM

## 2018-07-22 MED ORDER — DENOSUMAB 60 MG/ML ~~LOC~~ SOSY
60.0000 mg | PREFILLED_SYRINGE | Freq: Once | SUBCUTANEOUS | Status: AC
  Administered 2018-07-22: 14:00:00 60 mg via SUBCUTANEOUS

## 2018-09-04 LAB — HM DEXA SCAN

## 2018-09-04 LAB — HM MAMMOGRAPHY

## 2018-09-11 ENCOUNTER — Encounter: Payer: Self-pay | Admitting: Family Medicine

## 2018-12-27 ENCOUNTER — Other Ambulatory Visit: Payer: Medicare Other

## 2018-12-27 ENCOUNTER — Other Ambulatory Visit: Payer: Self-pay

## 2018-12-27 DIAGNOSIS — Z23 Encounter for immunization: Secondary | ICD-10-CM | POA: Diagnosis not present

## 2018-12-27 DIAGNOSIS — M810 Age-related osteoporosis without current pathological fracture: Secondary | ICD-10-CM | POA: Diagnosis not present

## 2018-12-27 MED ORDER — DENOSUMAB 60 MG/ML ~~LOC~~ SOSY
60.0000 mg | PREFILLED_SYRINGE | Freq: Once | SUBCUTANEOUS | Status: AC
  Administered 2018-12-27: 11:00:00 60 mg via SUBCUTANEOUS

## 2019-01-02 ENCOUNTER — Ambulatory Visit: Payer: Medicare Other | Admitting: Family Medicine

## 2019-03-26 NOTE — Progress Notes (Signed)
Chief Complaint  Patient presents with  . Medicare Wellness    fasting AWV with pelvic. Has had some bad vertigo over the last year. She ahs also had some more leg cramping-the entire leg (after being on legs for a long time or walking a long time. Also some leg pigmentation issues. Having vaginal irritation and dryness.    Paige Klein is a 69 y.o. female who presents for annual wellness visit and follow-up on chronic medical conditions.  She has the following concerns:  Vertigo--woke up one morning (05/20/2018) with head spinning, vomited.  Lasted a full day.  Hasn't occurred like that since.  Sometimes when she leans her head back to put drops in her eyes she feels funny--not spinning, just an odd sense that she needs to put her head back down (?might pass out, couldn't hold that position for long).  This is only intermittent.  L leg will cramp (sometimes on the R), after being on her feet a lot.  It isn't like a charlie horse, just a deep ache in her entire leg.  Getting up and walking around helps.   Denies any pain with activity.  Occurs after a lot of activity (walking), once when she was sitting.  Paroxysmal atrial fibrillation: Diagnosed 05/22/17.  She notices a fast heart rate, mainly at night, and triggered by wine.  It is sporadic, just 3-4 times in the last year, and uses diltiazem 47m prn. No associated chest pain or shortness of breath. (had been on daily diltiazem, higher dose, stopped due to bradycardia). She stopped Eliquis due to purpura on her legs. She last saw Dr. CCheryln Manly12/31/19. CHADS2VASC score is 2 (age, female), and he felt she could safely be off the Eliquis. She is not taking any aspirin.  She thinks she may be willing to restart the Eliquis if he recommends it again. She sees him next month.  She is bradycardic at baseline, and would be a candidate for ablation if having significant afib.    Hyperlipidemia:  Cholesterol was noted to be significantly higher on  last check a year ago (see below for results). She is fasting for recheck.  She can't recall any changes to her diet (other than being here post-holiday).  Had cholesterol checked when she donated blood in September and total was 209.  Osteoporosis: She continues on Prolia q6 months without side effects. This was started in 05/2015, last injection was 12/27/2018.  Last DEXA was 08/2018--T-2.0 L fem neck; hips stable, spine improved. She is s/p kyphoplasty 04/2015. She previously took Actonel x 6 years, changed to Fosamax (insurance reasons), stopped in 11/2011.   H/o left hip bursitis--has seen [facility] and had a cortisone injection in the past. Doing regular stretches and exercises helps.  Usually gets triggered by being on her feet a lot (Chiropractorgardener, plant sale, which didn't happen this year).  Postmenopausal with atrophic vaginitis,prescribedcompounded progesterone (bi-est (6:4)+progest+test 0.351m2%+0.025mg 1cc topically) in the past, but is no longer using. Last year she reported she was forgetting to use it frequently. She really hasn't had many symptoms recently, as she is not in a sexual relationship. Rare hot flash when she wakes up. She has noted some increased urinary urgency, rare incontinence, and some irritation/burning sensation recently.  She notes that she has changed detergents, which may contribute to her irritation.  Immunization History  Administered Date(s) Administered  . Fluad Quad(high Dose 65+) 12/27/2018  . Influenza Split 03/21/2007, 03/20/2008, 01/19/2011  . Influenza, High Dose Seasonal  PF 03/04/2015, 03/09/2016, 01/15/2017, 01/17/2018  . Influenza, Seasonal, Injecte, Preservative Fre 02/21/2012  . Influenza,inj,Quad PF,6+ Mos 02/24/2013, 02/25/2014  . PPD Test 02/11/1997  . Pneumococcal Conjugate-13 03/04/2015  . Pneumococcal Polysaccharide-23 03/09/2016  . Td 10/04/1990, 03/18/2001  . Tdap 04/13/2009  . Zoster 08/20/2012   Last Pap smear:  03/2018--normal with no high risk HPV Last mammogram:08/2018 Last colonoscopy: 07/2011 Dr. Collene Mares; hyperplastic polyps (10 yr f/u recommended) Last DEXA:08/2018--T-2.0 L fem neck; hips stable, spine improved ([place]) Ophtho: every 6 months (glaucoma) or more Dentist: regularly, every 6 months Exercise: Exercise videos at least 2x/week (some cardio). Does weights and biking at the [facility] (worked with trainer prior to pandemic), going 1-2x/week but hasn't gone recently (friend isn't as healthy, won't go back to gym)  Vitamin D level was normal twice in the paston current vitamins(last was 34 in 02/2014). Lipids--noted to be significantly higher last year than previously:   Ref Range & Units 1 yr ago 4 yr ago  Cholesterol, Total 100 - 199 mg/dL 244High     Triglycerides 0 - 149 mg/dL 88  84 R   HDL >39 mg/dL 70  55 R   VLDL Cholesterol Cal 5 - 40 mg/dL 18    LDL Calculated 0 - 99 mg/dL 156High   93 R, CM   Chol/HDL Ratio 0.0 - 4.4 ratio 3.5  3.0 R    Other doctors caring for patient include: GI: Dr. Collene Mares Ophtho: Dr. Mliss Fritz the past, now seeing specialist) [facility]: Dr. Edilia Bo Dentist: Dr. Johnnye Sima GYN: Dr. Mezer(retired) Cardiologist: Dr. Cheryln Manly (prev [facility]) Derm: Dr. Delman Cheadle  Depression screen: Negative Fall screen: None Functional status screen--notable forvision issues (glaucoma, sees ophtho),occasional urge/stress incontinence.  Mini-Cog screen: Normal See full screens in epic.  End of Life Discussion: Patienthasa living will and medical power of attorney   PMH, PSH, SH and FH reviewed and updated.  Outpatient Encounter Medications as of 03/27/2019  Medication Sig Note  . calcium citrate-vitamin D (CITRACAL+D) 315-200 MG-UNIT tablet Take 1 tablet by mouth 2 (two) times daily.   Marland Kitchen denosumab (PROLIA) 60 MG/ML SOLN injection Inject 60 mg into the skin every 6 (six) months. Administer in upper arm, thigh, or abdomen   . dorzolamide (TRUSOPT) 2 % ophthalmic  solution PLACE 1 DROP INTO both EYE(S) THREE TIMES A DAY   . loratadine (CLARITIN) 10 MG tablet Take 10 mg by mouth daily.   . LUTEIN PO Take 1 capsule by mouth daily.    . Multiple Vitamins-Minerals (CENTRUM SILVER PO) Take 1 tablet by mouth daily.     . Netarsudil-Latanoprost (ROCKLATAN) 0.02-0.005 % SOLN Apply 1 drop to eye daily.   . Olopatadine HCl 0.2 % SOLN Apply 1 drop to eye 2 (two) times daily.   . pilocarpine (PILOCAR) 4 % ophthalmic solution Place 1 drop into both eyes 3 (three) times daily.   . [DISCONTINUED] calcium carbonate (OSCAL) 1500 (600 Ca) MG TABS tablet Take by mouth 2 (two) times daily with a meal.   . AMBULATORY NON FORMULARY MEDICATION Apply 1 mL topically 2 (two) times daily. BI-EST(6:4)+PROGEST+TEST 0.3MG = 2% + 0.025MG CREAM. 60 QUANTITY. (Patient not taking: Reported on 03/25/2018) 03/27/2019: Stopped using over a year ago  . diltiazem (CARDIZEM) 30 MG tablet Take 30 mg by mouth as needed. 03/27/2019: Uses prn for tachycardia (only need a few times in the last year)  . ELIQUIS 5 MG TABS tablet TAKE 1 TABLET BY MOUTH TWICE A DAY (Patient not taking: Reported on 03/25/2018)  03/27/2019: Hasn't been on it for a year; expects to be put back on it by cardiologist so wants it still on her med list  . [DISCONTINUED] latanoprost (XALATAN) 0.005 % ophthalmic solution Place 1 drop into both eyes at bedtime.     . [DISCONTINUED] Netarsudil Dimesylate 0.02 % SOLN Place 1 drop into both eyes daily.    No facility-administered encounter medications on file as of 03/27/2019.   Allergies  Allergen Reactions  . Brinzolamide-Brimonidine Itching  . Cortisone Other (See Comments)    Entire body hurt from a cortisone shot.  . Fosamax [Alendronate Sodium]     GI upset, bone pain; tolerated Actonel  . Timolol Other (See Comments)    Slowed her heart rate down  . Tramadol Rash    ROS: The patient denies anorexia, fever, weight changes, headaches, decreased hearing, ear pain, sore throat, breast  concerns, chest pain, palpitations, dizziness, syncope, dyspnea on exertion,cough, swelling, nausea, vomiting, abdominal pain,diarrhea,melena, hematochezia, indigestion/heartburn(just occasional heartburn), hematuria,dysuria, postmenopausal bleeding, vaginal discharge, odor or itch, genital lesions, weakness, tremor, suspicious skin lesions, depression, anxiety, abnormal bleeding/bruising, or enlarged lymph nodes. Some urinary urgency, only rare leakage (on the way to the bathroom);slight urgency/incontinence when she has colds/cough. +vision loss--visual field loss from glaucoma Vertigo and leg cramps per HPI Urinary urgency and vaginal dryness. Tachycardia/afib per HPI   PHYSICAL EXAM:  BP 140/70   Pulse (!) 48   Temp (!) 96.6 F (35.9 C) (Other (Comment))   Ht 5' 7"  (1.702 m)   Wt 141 lb 6.4 oz (64.1 kg)   LMP 03/20/2000   BMI 22.15 kg/m   Wt Readings from Last 3 Encounters:  03/27/19 141 lb 6.4 oz (64.1 kg)  03/25/18 143 lb 6.4 oz (65 kg)  03/19/18 143 lb (64.9 kg)   128/58 on repeat by MD  General Appearance:  Alert, cooperative, no distress, appears her age  Head:  Normocephalic, without obvious abnormality, atraumatic   Eyes:  Miotic pupils, fundi not visualized; conjunctive is moderately injected L>R;EOM's intact  Ears:  Normal TM's and external ear canals   Nose:  Not examined, wearing mask due to COVID-19 pandemic  Throat:  Not examined, wearing mask due to COVID-19 pandemic  Neck:  Supple, no lymphadenopathy; thyroid: no enlargement/tenderness/nodules; no carotid bruit or JVD   Back:  Spine nontender, no curvature, ROM normal, no CVA tenderness   Lungs:  Clear to auscultation bilaterally without wheezes, rales or rhonchi; respirations unlabored   Chest Wall:  No tenderness or deformity   Heart:  Bradycardic(50's); S1 and S2 normal, no murmur, rub or gallop.   Breast Exam:  No tenderness, masses, or nipple discharge or  inversion. No axillary lymphadenopathy   Abdomen:  Soft, non-tender, nondistended, normoactive bowel sounds, no masses, no hepatosplenomegaly   Genitalia:  Normal external genitalia without lesions. + atrophic changes. No cervical motion tenderness. Uterus and adnexa not enlarged, nontender, no masses. Pap not performed   Rectal:  Normal tone, no masses or tenderness; guaiac negative stool   Extremities:  No clubbing, cyanosis. Trace pitting edema at left ankle  Pulses:  2+dorsalis pedis and PT pulses  Skin:  Skin color, texture, turgor normal.Hyperpigmented patchesat the anteromedial left lower leg, unchanged.   Lymph nodes:  Cervical, supraclavicular, and axillary nodes normal   Neurologic:  CNII-XII intact, normal strength, sensation and gait; reflexes 2+ and symmetric throughout    Psych: Normal mood, affect, hygiene and grooming   ASSESSMENT/PLAN:  Annual physical exam - Plan: CBC with Differential,  Comprehensive metabolic panel, Lipid panel, TSH  Medicare annual wellness visit, subsequent  Osteoporosis without current pathological fracture, unspecified osteoporosis type - Cont Prolia q6 mos.  Cont Ca, D, weight-bearing exercise. DEXA due 08/2020 - Plan: TSH  Post-menopausal atrophic vaginitis - discussed use of Vagisil prn for irritation.  If not improving, or urinary sx worsening, can add topical premarin  Paroxysmal atrial fibrillation ([place]) - f/u with cardiologist as scheduled. - Plan: TSH  Pure hypercholesterolemia - due for recheck; counseled re: low cholesterol diet - Plan: Lipid panel  Vertigo - only one episode in March. Ddx reviewed.  discussed meclizine prn if recurs. likely viral  Leg cramps - reassured of normal circulation.  Encouraged to increase water intake and this should prevent cramps   Vagisil; consider topical premarin periurethrally  Cbc, c-met, lipids, tsh    TdaP (give written rx) and Shingrix from  pharm COVID vaccine when avail  Discussed monthly self breast exams and yearly mammograms; at least 30 minutes of aerobic activity at least 5 days/week, weight-bearing exercise 2x/week; proper sunscreen use reviewed; healthy diet, including goals of calcium and vitamin D intake and alcohol recommendations (less than or equal to 1 drink/day) reviewed; regular seatbelt use; changing batteries in smoke detectors. Immunization recommendations discussed--Continue high dose flu shots yearly. TdaP is due, need to get from pharmacy.  Shingrix recommended.Risks/side effects reviewed, to get at pharmacy. COVID vaccine recommended when available.Colonoscopy recommendations reviewed--UTD, due again 07/2021 DEXA 08/2020  Full Code, Full Care MOST form reviewed and updated.  F/u 1 year   Medicare Attestation I have personally reviewed: The patient's medical and social history Their use of alcohol, tobacco or illicit drugs Their current medications and supplements The patient's functional ability including ADLs,fall risks, home safety risks, cognitive, and hearing and visual impairment Diet and physical activities Evidence for depression or mood disorders  The patient's weight, height, BMI have been recorded in the chart.  I have made referrals, counseling, and provided education to the patient based on review of the above and I have provided the patient with a written personalized care plan for preventive services.

## 2019-03-26 NOTE — Patient Instructions (Addendum)
HEALTH MAINTENANCE RECOMMENDATIONS:  It is recommended that you get at least 30 minutes of aerobic exercise at least 5 days/week (for weight loss, you may need as much as 60-90 minutes). This can be any activity that gets your heart rate up. This can be divided in 10-15 minute intervals if needed, but try and build up your endurance at least once a week.  Weight bearing exercise is also recommended twice weekly.  Eat a healthy diet with lots of vegetables, fruits and fiber.  "Colorful" foods have a lot of vitamins (ie green vegetables, tomatoes, red peppers, etc).  Limit sweet tea, regular sodas and alcoholic beverages, all of which has a lot of calories and sugar.  Up to 1 alcoholic drink daily may be beneficial for women (unless trying to lose weight, watch sugars).  Drink a lot of water.  Calcium recommendations are 1200-1500 mg daily (1500 mg for postmenopausal women or women without ovaries), and vitamin D 1000 IU daily.  This should be obtained from diet and/or supplements (vitamins), and calcium should not be taken all at once, but in divided doses.  Monthly self breast exams and yearly mammograms for women over the age of 65 is recommended.  Sunscreen of at least SPF 30 should be used on all sun-exposed parts of the skin when outside between the hours of 10 am and 4 pm (not just when at beach or pool, but even with exercise, golf, tennis, and yard work!)  Use a sunscreen that says "broad spectrum" so it covers both UVA and UVB rays, and make sure to reapply every 1-2 hours.  Remember to change the batteries in your smoke detectors when changing your clock times in the spring and fall. Carbon monoxide detectors are recommended for your home.  Use your seat belt every time you are in a car, and please drive safely and not be distracted with cell phones and texting while driving.   Ms. Hemler , Thank you for taking time to come for your Medicare Wellness Visit. I appreciate your ongoing  commitment to your health goals. Please review the following plan we discussed and let me know if I can assist you in the future.   This is a list of the screening recommended for you and due dates:  Health Maintenance  Topic Date Due  . Tetanus Vaccine  04/14/2019  . Mammogram  09/04/2019  . Colon Cancer Screening  08/01/2021  . Flu Shot  Completed  . DEXA scan (bone density measurement)  09/02/2020  .  Hepatitis C: One time screening is recommended by Center for Disease Control  (CDC) for  adults born from [birth year] through 1966.   Completed  . Pneumonia vaccines  Completed   COVID vaccine is recommended when available.  Please be sure to watch the news, or follow information from websites such as [email or web address], the [facility] DHHS website, or [email or web address]  I recommend getting the new shingles vaccine (Shingrix). You will need to get this from the pharmacy rather than our office, as it is covered by Medicare Part D.  It is a series of 2 injections, spaced 2 months apart.  You are due for Tdap vaccine (tetanus booster). This is covered by Medicare Part D, and you need to get this from the pharmacy.  Vagisil can be tried for external vaginal irritation. If you want to try topical premarin cream (to use just externally, especially around the urethra), let us know.  Your cramping likely can be prevented by drinking more  water and staying hydrated.  Keep some meclizine on hand in case the vertigo recurs (found in many brands over the counter--Bonine, Dramamine-N).   Fat and Cholesterol Restricted Eating Plan Getting too much fat and cholesterol in your diet may cause health problems. Choosing the right foods helps keep your fat and cholesterol at normal levels. This can keep you from getting certain diseases.  Meal planning  At meals, divide your plate into four equal parts: ? Fill one-half of your plate with vegetables and green salads. ? Fill one-fourth of your plate with whole  grains. ? Fill one-fourth of your plate with low-fat (lean) protein foods.  Eat fish that is high in omega-3 fats at least two times a week. This includes mackerel, tuna, sardines, and salmon.  Eat foods that are high in fiber, such as whole grains, beans, apples, broccoli, carrots, peas, and barley.   General tips   Work with your doctor to lose weight if you need to.  Avoid: ? Foods with added sugar. ? Fried foods. ? Foods with partially hydrogenated oils.  Limit alcohol intake to no more than 1 drink a day for nonpregnant women and 2 drinks a day for men. One drink equals 12 oz of beer, 5 oz of wine, or 1 oz of hard liquor.   Reading food labels  Check food labels for: ? Trans fats. ? Partially hydrogenated oils. ? Saturated fat (g) in each serving. ? Cholesterol (mg) in each serving. ? Fiber (g) in each serving.  Choose foods with healthy fats, such as: ? Monounsaturated fats. ? Polyunsaturated fats. ? Omega-3 fats.  Choose grain products that have whole grains. Look for the word "whole" as the first word in the ingredient list. Cooking  Cook foods using low-fat methods. These include baking, boiling, grilling, and broiling.  Eat more home-cooked foods. Eat at restaurants and buffets less often.  Avoid cooking using saturated fats, such as butter, cream, palm oil, palm kernel oil, and coconut oil.   Recommended foods  Fruits  All fresh, canned (in natural juice), or frozen fruits. Vegetables  Fresh or frozen vegetables (raw, steamed, roasted, or grilled). Green salads. Grains  Whole grains, such as whole wheat or whole grain breads, crackers, cereals, and pasta. Unsweetened oatmeal, bulgur, barley, quinoa, or brown rice. Corn or whole wheat flour tortillas. Meats and other protein foods  Ground beef (85% or leaner), grass-fed beef, or beef trimmed of fat. Skinless chicken or [place]. Ground chicken or [place]. Pork trimmed of fat. All fish and seafood. Egg  whites. Dried beans, peas, or lentils. Unsalted nuts or seeds. Unsalted canned beans. Nut butters without added sugar or oil. Dairy  Low-fat or nonfat dairy products, such as skim or 1% milk, 2% or reduced-fat cheeses, low-fat and fat-free ricotta or cottage cheese, or plain low-fat and nonfat yogurt. Fats and oils  Tub margarine without trans fats. Light or reduced-fat mayonnaise and salad dressings. Avocado. Olive, canola, sesame, or safflower oils. The items listed above may not be a complete list of foods and beverages you can eat. Contact a dietitian for more information.  Foods to avoid Fruits  Canned fruit in heavy syrup. Fruit in cream or butter sauce. Fried fruit. Vegetables  Vegetables cooked in cheese, cream, or butter sauce. Fried vegetables. Grains  White bread. White pasta. White rice. Cornbread. Bagels, pastries, and croissants. Crackers and snack foods that contain trans fat and hydrogenated oils. Meats and other protein foods  Fatty cuts of meat. Ribs, chicken wings,  bacon, sausage, bologna, salami, chitterlings, fatback, hot dogs, bratwurst, and packaged lunch meats. Liver and organ meats. Whole eggs and egg yolks. Chicken and [place] with skin. Fried meat. Dairy  Whole or 2% milk, cream, half-and-half, and cream cheese. Whole milk cheeses. Whole-fat or sweetened yogurt. Full-fat cheeses. Nondairy creamers and whipped toppings. Processed cheese, cheese spreads, and cheese curds. Beverages  Alcohol. Sugar-sweetened drinks such as sodas, lemonade, and fruit drinks. Fats and oils  Butter, stick margarine, lard, shortening, ghee, or bacon fat. Coconut, palm kernel, and palm oils. Sweets and desserts  Corn syrup, sugars, honey, and molasses. Candy. Jam and jelly. Syrup. Sweetened cereals. Cookies, pies, cakes, donuts, muffins, and ice cream. The items listed above may not be a complete list of foods and beverages you should avoid. Contact a dietitian for more  information. Summary  Choosing the right foods helps keep your fat and cholesterol at normal levels. This can keep you from getting certain diseases.  At meals, fill one-half of your plate with vegetables and green salads.  Eat high-fiber foods, like whole grains, beans, apples, carrots, peas, and barley.  Limit added sugar, saturated fats, alcohol, and fried foods. This information is not intended to replace advice given to you by your health care provider. Make sure you discuss any questions you have with your health care provider. Document Revised: 11/07/2017 Document Reviewed: 11/21/2016 Elsevier Patient Education  [place].

## 2019-03-27 ENCOUNTER — Encounter: Payer: Self-pay | Admitting: Family Medicine

## 2019-03-27 ENCOUNTER — Other Ambulatory Visit: Payer: Self-pay

## 2019-03-27 ENCOUNTER — Ambulatory Visit: Payer: Medicare PPO | Admitting: Family Medicine

## 2019-03-27 VITALS — BP 128/58 | HR 48 | Temp 96.6°F | Ht 67.0 in | Wt 141.4 lb

## 2019-03-27 DIAGNOSIS — I480 Paroxysmal atrial fibrillation: Secondary | ICD-10-CM | POA: Diagnosis not present

## 2019-03-27 DIAGNOSIS — M810 Age-related osteoporosis without current pathological fracture: Secondary | ICD-10-CM | POA: Diagnosis not present

## 2019-03-27 DIAGNOSIS — Z0000 Encounter for general adult medical examination without abnormal findings: Secondary | ICD-10-CM | POA: Diagnosis not present

## 2019-03-27 DIAGNOSIS — N952 Postmenopausal atrophic vaginitis: Secondary | ICD-10-CM

## 2019-03-27 DIAGNOSIS — E7800 Pure hypercholesterolemia, unspecified: Secondary | ICD-10-CM | POA: Diagnosis not present

## 2019-03-27 DIAGNOSIS — R252 Cramp and spasm: Secondary | ICD-10-CM

## 2019-03-27 DIAGNOSIS — R42 Dizziness and giddiness: Secondary | ICD-10-CM

## 2019-03-28 LAB — CBC WITH DIFFERENTIAL/PLATELET
Basophils Absolute: 0.1 10*3/uL (ref 0.0–0.2)
Basos: 1 %
EOS (ABSOLUTE): 0.1 10*3/uL (ref 0.0–0.4)
Eos: 2 %
Hematocrit: 41.8 % (ref 34.0–46.6)
Hemoglobin: 13.4 g/dL (ref 11.1–15.9)
Immature Grans (Abs): 0 10*3/uL (ref 0.0–0.1)
Immature Granulocytes: 0 %
Lymphocytes Absolute: 1.7 10*3/uL (ref 0.7–3.1)
Lymphs: 23 %
MCH: 29.8 pg (ref 26.6–33.0)
MCHC: 32.1 g/dL (ref 31.5–35.7)
MCV: 93 fL (ref 79–97)
Monocytes Absolute: 0.7 10*3/uL (ref 0.1–0.9)
Monocytes: 9 %
Neutrophils Absolute: 4.8 10*3/uL (ref 1.4–7.0)
Neutrophils: 65 %
Platelets: 286 10*3/uL (ref 150–450)
RBC: 4.49 x10E6/uL (ref 3.77–5.28)
RDW: 12 % (ref 11.7–15.4)
WBC: 7.3 10*3/uL (ref 3.4–10.8)

## 2019-03-28 LAB — COMPREHENSIVE METABOLIC PANEL
ALT: 13 IU/L (ref 0–32)
AST: 19 IU/L (ref 0–40)
Albumin/Globulin Ratio: 1.6 (ref 1.2–2.2)
Albumin: 4.5 g/dL (ref 3.8–4.8)
Alkaline Phosphatase: 45 IU/L (ref 39–117)
BUN/Creatinine Ratio: 23 (ref 12–28)
BUN: 20 mg/dL (ref 8–27)
Bilirubin Total: 0.2 mg/dL (ref 0.0–1.2)
CO2: 25 mmol/L (ref 20–29)
Calcium: 9.5 mg/dL (ref 8.7–10.3)
Chloride: 105 mmol/L (ref 96–106)
Creatinine, Ser: 0.87 mg/dL (ref 0.57–1.00)
GFR calc Af Amer: 79 mL/min/{1.73_m2} (ref >59)
GFR calc non Af Amer: 68 mL/min/{1.73_m2} (ref >59)
Globulin, Total: 2.8 g/dL (ref 1.5–4.5)
Glucose: 83 mg/dL (ref 65–99)
Potassium: 4.7 mmol/L (ref 3.5–5.2)
Sodium: 144 mmol/L (ref 134–144)
Total Protein: 7.3 g/dL (ref 6.0–8.5)

## 2019-03-28 LAB — LIPID PANEL
Chol/HDL Ratio: 3.2 ratio (ref 0.0–4.4)
Cholesterol, Total: 232 mg/dL — ABNORMAL HIGH (ref 100–199)
HDL: 73 mg/dL (ref >39)
LDL Chol Calc (NIH): 139 mg/dL — ABNORMAL HIGH (ref 0–99)
Triglycerides: 114 mg/dL (ref 0–149)
VLDL Cholesterol Cal: 20 mg/dL (ref 5–40)

## 2019-03-28 LAB — TSH: TSH: 2.39 u[IU]/mL (ref 0.450–4.500)

## 2019-04-22 ENCOUNTER — Other Ambulatory Visit: Payer: Self-pay

## 2019-04-22 ENCOUNTER — Encounter: Payer: Self-pay | Admitting: Cardiology

## 2019-04-22 ENCOUNTER — Ambulatory Visit: Payer: Medicare PPO | Admitting: Cardiology

## 2019-04-22 VITALS — BP 118/60 | HR 51 | Ht 67.0 in | Wt 144.6 lb

## 2019-04-22 DIAGNOSIS — I480 Paroxysmal atrial fibrillation: Secondary | ICD-10-CM | POA: Diagnosis not present

## 2019-04-22 MED ORDER — APIXABAN 5 MG PO TABS: 5.0000 mg | ORAL_TABLET | Freq: Two times a day (BID) | ORAL | 3 refills | Status: DC

## 2019-04-22 NOTE — Patient Instructions (Signed)
Medication Instructions:  Your physician recommends that you continue on your current medications as directed. Please refer to the Current Medication list given to you today.  * If you need a refill on your cardiac medications before your next appointment, please call your pharmacy.   Labwork: None ordered  Testing/Procedures: None ordered  Follow-Up: At [facility], you and your health needs are our priority.  As part of our continuing mission to provide you with exceptional heart care, we have created designated Provider Care Teams.  These Care Teams include your primary Cardiologist (physician) and Advanced Practice Providers (APPs -  Physician Assistants and Nurse Practitioners) who all work together to provide you with the care you need, when you need it.  You will need a follow up appointment in 1 year.  Please call our office 2 months in advance to schedule this appointment.  You may see Dr Camnitz or one of the following Advanced Practice Providers on your designated Care Team:    Amber Seiler, NP  Renee Ursuy, PA-C  Andy Tillery, PA-C   Thank you for choosing [facility]!!   Maurica Omura, RN [phone]  Any Other Special Instructions Will Be Listed Below (If Applicable).        

## 2019-04-22 NOTE — Progress Notes (Signed)
Electrophysiology Office Note   Date:  04/22/2019   ID:  Paige Klein, DOB [birth year], MRN [id number]  PCP:  [name], MD  Cardiologist:  Anne Fu Primary Electrophysiologist:  Dr Elberta Fortis    CC: Follow up for paroxysmal atrial fibrillation   History of Present Illness: Paige Klein is a 69 y.o. female who is being seen today for the evaluation of atrial fibrillation at the request of Donato Schultz. Presenting today for electrophysiology evaluation.  She has a history of atrial fibrillation, hyperlipidemia.  Her atrial fibrillation was diagnosed 05/22/17.  She was found to be in atrial fibrillation with rapid ventricular response.  She was started on diltiazem 120 mg as well as Eliquis.  She did not want to be on beta-blocker due to prior fatigue.  With diltiazem, her heart rate has been low.    Today, denies symptoms of palpitations, chest pain, shortness of breath, orthopnea, PND, lower extremity edema, claudication, dizziness, presyncope, syncope, bleeding, or neurologic sequela. The patient is tolerating medications without difficulties.  Overall she has been feeling well.  She had stopped her Eliquis previously but has since restarted it as she has been having more frequent symptoms of atrial fibrillation.  Her symptoms occur once or twice a month.  Past Medical History:  Diagnosis Date  . A-fib (HCC) 05/22/2017  . Abnormal Pap smear of cervix 1999   ASGUS x 1  . Allergic rhinitis, cause unspecified    mold; and vasomotor rhinitis  . Bradycardia 02/25/2014  . Cataract   . Fibrocystic breast changes    and also breast calcifications (08/2008)  . Glaucoma   . Glaucoma    followed by [facility] Dr. Lottie Dawson  . Glaucoma suspect of both eyes    high-normal intraocular pressures (Dr. Emily Filbert)  . H/O bone density study 09/07/09   08/16/07, 08/08/05; osteoporosis  . H/O mammogram 11/23/10   normal  . Hypercholesteremia    borderline per NMR lipoprofile 2008, 2007  . Internal hemorrhoids     . Kidney stone 1986   right  . Migraine headache    resolved with menopause (menstrual migraines)  . MVP (mitral valve prolapse)    per echo  . Osteoporosis   . Osteoporosis 02/22/2012  . Pap smear for cervical cancer screening 04/14/09   negative for malignancy, +atrophic changes  . Postmenopausal    on compounded cream daily  . Unspecified vitamin D deficiency 2009  . Vitamin D deficiency 02/22/2012   Past Surgical History:  Procedure Laterality Date  . BREAST CYST ASPIRATION     many (Dr. Maryagnes Amos)  . BREAST FIBROADENOMA SURGERY  09-03   Dr.Gooden  . cardiolite (stress test)  11/05   Dr. Ermalinda Memos  . cataract surgery  2010   bilateral  . CERVICAL POLYPECTOMY  2003   treated with cryosurgery  . COLONOSCOPY  08/02/11; 05/2000   Dr. Loreta Ave - rectal polyps 2013  . DILATION AND CURETTAGE OF UTERUS  05/2009   uterine polyp, bleeding  . endocervical cyst  12/96, 6/97   Dr Chevis Pretty  . EYE SURGERY    . KYPHOPLASTY N/A 04/29/2015   Procedure: KYPHOPLASTY;  Surgeon: Estill Bamberg, MD;  Location: [facility] OR;  Service: Orthopedics;  Laterality: N/A;  Lumbar 1 kyphoplasty     Current Outpatient Medications  Medication Sig Dispense Refill  . AMBULATORY NON FORMULARY MEDICATION Apply 1 mL topically 2 (two) times daily. BI-EST(6:4)+PROGEST+TEST 0.3MG  = 2% + 0.025MG  CREAM. 60 QUANTITY. (Patient not taking: Reported on 03/25/2018) 60 mL  1  . calcium citrate-vitamin D (CITRACAL+D) 315-200 MG-UNIT tablet Take 1 tablet by mouth 2 (two) times daily.    Marland Kitchen denosumab (PROLIA) 60 MG/ML SOLN injection Inject 60 mg into the skin every 6 (six) months. Administer in upper arm, thigh, or abdomen    . diltiazem (CARDIZEM) 30 MG tablet Take 30 mg by mouth as needed.    . dorzolamide (TRUSOPT) 2 % ophthalmic solution PLACE 1 DROP INTO both EYE(S) THREE TIMES A DAY  3  . ELIQUIS 5 MG TABS tablet TAKE 1 TABLET BY MOUTH TWICE A DAY (Patient not taking: Reported on 03/25/2018) 60 tablet 5  . loratadine (CLARITIN) 10 MG tablet  Take 10 mg by mouth daily.    . LUTEIN PO Take 1 capsule by mouth daily.     . Multiple Vitamins-Minerals (CENTRUM SILVER PO) Take 1 tablet by mouth daily.      . Netarsudil-Latanoprost (ROCKLATAN) 0.02-0.005 % SOLN Apply 1 drop to eye daily.    . Olopatadine HCl 0.2 % SOLN Apply 1 drop to eye 2 (two) times daily.    . pilocarpine (PILOCAR) 4 % ophthalmic solution Place 1 drop into both eyes 3 (three) times daily.     No current facility-administered medications for this visit.    Allergies:   Brinzolamide-brimonidine, Cortisone, Fosamax [alendronate sodium], Timolol, and Tramadol   Social History:  The patient  reports that she has never smoked. She has never used smokeless tobacco. She reports current alcohol use. She reports that she does not use drugs.   Family History:  The patient's family history includes Arthritis in her cousin and mother; Cancer in her cousin, cousin, maternal grandfather, maternal grandmother, and mother; Celiac disease in her maternal uncle; Diabetes in her maternal aunt; Hypertension in her father and mother; Muscular dystrophy in her maternal aunt; Other in her father; Ovarian cancer (age of onset: 67) in her cousin.    ROS:  Please see the history of present illness.   Otherwise, review of systems is positive for none.   All other systems are reviewed and negative.   PHYSICAL EXAM: VS:  LMP 03/20/2000  , BMI There is no height or weight on file to calculate BMI. GEN: Well nourished, well developed, in no acute distress  HEENT: normal  Neck: no JVD, carotid bruits, or masses Cardiac: RRR; no murmurs, rubs, or gallops,no edema  Respiratory:  clear to auscultation bilaterally, normal work of breathing GI: soft, nontender, nondistended, + BS MS: no deformity or atrophy  Skin: warm and dry Neuro:  Strength and sensation are intact Psych: euthymic mood, full affect  EKG:  EKG is ordered today. Personal review of the ekg ordered shows sinus rhythm, rate  51  Recent Labs: 03/27/2019: ALT 13; BUN 20; Creatinine, Ser 0.87; Hemoglobin 13.4; Platelets 286; Potassium 4.7; Sodium 144; TSH 2.390    Lipid Panel     Component Value Date/Time   CHOL 232 (H) 03/27/2019 1127   TRIG 114 03/27/2019 1127   HDL 73 03/27/2019 1127   CHOLHDL 3.2 03/27/2019 1127   CHOLHDL 3.0 03/04/2015 0001   VLDL 17 03/04/2015 0001   LDLCALC 139 (H) 03/27/2019 1127     Wt Readings from Last 3 Encounters:  03/27/19 141 lb 6.4 oz (64.1 kg)  03/25/18 143 lb 6.4 oz (65 kg)  03/19/18 143 lb (64.9 kg)      Other studies Reviewed: Additional studies/ records that were reviewed today include: TTE 05/31/17  Review of the above records today demonstrates:  -  Left ventricle: The cavity size was normal. Wall thickness was   normal. Systolic function was normal. The estimated ejection   fraction was in the range of 60% to 65%. Wall motion was normal;   there were no regional wall motion abnormalities. - Mitral valve: Prolapse, involving the anterior leaflet. There was   mild to moderate regurgitation.   ASSESSMENT AND PLAN:  1.  Paroxysmal atrial fibrillation: Currently on as needed diltiazem.  CHA2DS2-VASc of 2.  Previously wanted to stop her Eliquis but with her increasing symptoms has restarted it.  No other changes.  Current medicines are reviewed at length with the patient today.   The patient does not have concerns regarding her medicines.  The following changes were made today: None  Labs/ tests ordered today include:  No orders of the defined types were placed in this encounter.  Disposition:   FU with [name] 12 months.   Signed, Aeden Matranga [name], MD  04/22/2019 8:31 AM     [facility] [address] [place] [address] [phone] (office) [phone] (fax)

## 2019-06-03 ENCOUNTER — Telehealth: Payer: Self-pay | Admitting: Family Medicine

## 2019-06-03 DIAGNOSIS — R21 Rash and other nonspecific skin eruption: Secondary | ICD-10-CM | POA: Diagnosis not present

## 2019-06-03 NOTE — Telephone Encounter (Signed)
Pt has changed insurances since last Prolia. New ins is requiring a Prior Auth. Sending back in folder to be completed. Please return to [facility].

## 2019-06-05 ENCOUNTER — Telehealth: Payer: Self-pay | Admitting: Family Medicine

## 2019-06-05 NOTE — Telephone Encounter (Signed)
Pt advised (not on oral meds, just topical antifungal/cortisone cream), should be fine. Contact me if things change

## 2019-06-05 NOTE — Telephone Encounter (Signed)
Pt called and said she had an allergic reaction and went to an urgent care and was given a cortisone cream. She said she is fine now but is scheduled to get her vaccine on Monday and is wondering if that is something she should hold off on or could she go ahead and get it

## 2019-07-02 ENCOUNTER — Other Ambulatory Visit: Payer: Self-pay

## 2019-07-02 ENCOUNTER — Other Ambulatory Visit: Payer: Medicare PPO

## 2019-07-02 DIAGNOSIS — M810 Age-related osteoporosis without current pathological fracture: Secondary | ICD-10-CM

## 2019-07-02 MED ORDER — DENOSUMAB 60 MG/ML ~~LOC~~ SOSY
60.0000 mg | PREFILLED_SYRINGE | Freq: Once | SUBCUTANEOUS | Status: AC
  Administered 2019-07-02: 60 mg via SUBCUTANEOUS

## 2019-08-04 DIAGNOSIS — H401133 Primary open-angle glaucoma, bilateral, severe stage: Secondary | ICD-10-CM | POA: Diagnosis not present

## 2019-09-11 DIAGNOSIS — Z1231 Encounter for screening mammogram for malignant neoplasm of breast: Secondary | ICD-10-CM | POA: Diagnosis not present

## 2019-09-11 LAB — HM MAMMOGRAPHY

## 2019-09-15 ENCOUNTER — Encounter: Payer: Self-pay | Admitting: *Deleted

## 2019-11-13 DIAGNOSIS — H401133 Primary open-angle glaucoma, bilateral, severe stage: Secondary | ICD-10-CM | POA: Diagnosis not present

## 2019-12-24 DIAGNOSIS — Z20822 Contact with and (suspected) exposure to covid-19: Secondary | ICD-10-CM | POA: Diagnosis not present

## 2020-01-05 ENCOUNTER — Other Ambulatory Visit: Payer: Self-pay

## 2020-01-05 ENCOUNTER — Other Ambulatory Visit: Payer: Medicare PPO

## 2020-01-05 DIAGNOSIS — M810 Age-related osteoporosis without current pathological fracture: Secondary | ICD-10-CM

## 2020-01-05 MED ORDER — DENOSUMAB 60 MG/ML ~~LOC~~ SOSY
60.0000 mg | PREFILLED_SYRINGE | Freq: Once | SUBCUTANEOUS | Status: AC
  Administered 2020-01-05: 60 mg via SUBCUTANEOUS

## 2020-03-20 DIAGNOSIS — U071 COVID-19: Secondary | ICD-10-CM

## 2020-03-20 HISTORY — DX: COVID-19: U07.1

## 2020-03-26 DIAGNOSIS — Z20822 Contact with and (suspected) exposure to covid-19: Secondary | ICD-10-CM | POA: Diagnosis not present

## 2020-04-01 ENCOUNTER — Other Ambulatory Visit: Payer: Self-pay | Admitting: Family Medicine

## 2020-04-01 DIAGNOSIS — E7800 Pure hypercholesterolemia, unspecified: Secondary | ICD-10-CM

## 2020-04-01 DIAGNOSIS — Z0000 Encounter for general adult medical examination without abnormal findings: Secondary | ICD-10-CM

## 2020-04-02 ENCOUNTER — Other Ambulatory Visit: Payer: Medicare PPO

## 2020-04-02 ENCOUNTER — Other Ambulatory Visit: Payer: Self-pay

## 2020-04-02 DIAGNOSIS — Z0000 Encounter for general adult medical examination without abnormal findings: Secondary | ICD-10-CM | POA: Diagnosis not present

## 2020-04-02 DIAGNOSIS — E7800 Pure hypercholesterolemia, unspecified: Secondary | ICD-10-CM | POA: Diagnosis not present

## 2020-04-03 LAB — CBC WITH DIFFERENTIAL/PLATELET
Basophils Absolute: 0.1 10*3/uL (ref 0.0–0.2)
Basos: 1 %
EOS (ABSOLUTE): 0.1 10*3/uL (ref 0.0–0.4)
Eos: 1 %
Hematocrit: 39.7 % (ref 34.0–46.6)
Hemoglobin: 13 g/dL (ref 11.1–15.9)
Immature Grans (Abs): 0 10*3/uL (ref 0.0–0.1)
Immature Granulocytes: 0 %
Lymphocytes Absolute: 1.5 10*3/uL (ref 0.7–3.1)
Lymphs: 21 %
MCH: 30.4 pg (ref 26.6–33.0)
MCHC: 32.7 g/dL (ref 31.5–35.7)
MCV: 93 fL (ref 79–97)
Monocytes Absolute: 0.6 10*3/uL (ref 0.1–0.9)
Monocytes: 8 %
Neutrophils Absolute: 4.9 10*3/uL (ref 1.4–7.0)
Neutrophils: 69 %
Platelets: 346 10*3/uL (ref 150–450)
RBC: 4.28 x10E6/uL (ref 3.77–5.28)
RDW: 11.7 % (ref 11.7–15.4)
WBC: 7.1 10*3/uL (ref 3.4–10.8)

## 2020-04-03 LAB — COMPREHENSIVE METABOLIC PANEL
ALT: 10 IU/L (ref 0–32)
AST: 14 IU/L (ref 0–40)
Albumin/Globulin Ratio: 1.7 (ref 1.2–2.2)
Albumin: 4.3 g/dL (ref 3.8–4.8)
Alkaline Phosphatase: 46 IU/L (ref 44–121)
BUN/Creatinine Ratio: 26 (ref 12–28)
BUN: 26 mg/dL (ref 8–27)
Bilirubin Total: 0.3 mg/dL (ref 0.0–1.2)
CO2: 23 mmol/L (ref 20–29)
Calcium: 9.6 mg/dL (ref 8.7–10.3)
Chloride: 105 mmol/L (ref 96–106)
Creatinine, Ser: 0.99 mg/dL (ref 0.57–1.00)
GFR calc Af Amer: 67 mL/min/{1.73_m2} (ref >59)
GFR calc non Af Amer: 58 mL/min/{1.73_m2} — ABNORMAL LOW (ref >59)
Globulin, Total: 2.6 g/dL (ref 1.5–4.5)
Glucose: 89 mg/dL (ref 65–99)
Potassium: 4.6 mmol/L (ref 3.5–5.2)
Sodium: 145 mmol/L — ABNORMAL HIGH (ref 134–144)
Total Protein: 6.9 g/dL (ref 6.0–8.5)

## 2020-04-03 LAB — LIPID PANEL
Chol/HDL Ratio: 3.9 ratio (ref 0.0–4.4)
Cholesterol, Total: 198 mg/dL (ref 100–199)
HDL: 51 mg/dL (ref >39)
LDL Chol Calc (NIH): 129 mg/dL — ABNORMAL HIGH (ref 0–99)
Triglycerides: 99 mg/dL (ref 0–149)
VLDL Cholesterol Cal: 18 mg/dL (ref 5–40)

## 2020-04-04 NOTE — Patient Instructions (Addendum)
   Paige Klein , Thank you for taking time to come for your Medicare Wellness Visit. I appreciate your ongoing commitment to your health goals. Please review the following plan we discussed and let me know if I can assist you in the future.    This is a list of the screening recommended for you and due dates:  Health Maintenance  Topic Date Due  . COVID-19 Vaccine (1) Never done  . Tetanus Vaccine  04/14/2019  . Flu Shot  10/19/2019  . Colon Cancer Screening  08/01/2021  . Mammogram  09/10/2020  . DEXA scan (bone density measurement)  09/03/2020  .  Hepatitis C: One time screening is recommended by Center for Disease Control  (CDC) for  adults born from [birth year] through 1966.   Completed  . Pneumonia vaccines  Completed   I recommend that you get a flu shot and your COVID booster. We discussed today that some of your current symptoms (nasal congestion, loose stools) could potentially be COVID, and to get tested.  If your COVID test is positive, then I would delay getting the booster for at least a month, but no longer than 3.  If your COVID test is negative, then you can get the flu shot and the COVID booster on the same day. You can call our office to schedule a nurse visit (vs get them from a pharmacy or elsewhere).  Please get a tetanus booster (Td or TdaP) from your pharmacy.  This is recommended every 10 years, ans your last was in 03/2009. You want to wait 2 weeks after the flu/COVID vaccines before getting the tetanus booster.  I recommend getting the new shingles vaccine (Shingrix). Since you have Medicare, you will need to get this from the pharmacy, as it is covered by Part D. This is a series of 2 injections, spaced 2 months apart. This needs to be separated from other vaccines by 2-4 weeks (if not given on the same day).  You will be due for another bone density test when your mammogram is due.  Be sure to schedule both with [facility] for June.

## 2020-04-04 NOTE — Progress Notes (Signed)
Start time: 8:33 End time: 9:42  Virtual Visit via Video Note  I connected with Paige Klein on 04/05/20 by a video enabled telemedicine application and verified that I am speaking with the correct person using two identifiers.  Location: Patient: home Provider: home office (office closed due to inclement weather)    I discussed the limitations of evaluation and management by telemedicine and the availability of in person appointments. The patient expressed understanding and agreed to proceed.  History of Present Illness:  Chief Complaint  Patient presents with  . Medicare Wellness    VIRTUAL AWV exam, no new concerns. Has been having some diarrhea-had covid test 2 weeks ago and it was negative. Still having some diarrhea but is feeling a little bit better today. Also had some fatigue and soreness in her muscles. No other concerns. Has not gotten any immunizations(no flu, no covid booster, no shingrix, no tdap).    Patient presents for Medicare AWV and med check.  The CPE will need to be rescheduled--visit is being done virtually due to unsafe roads/snow/ice. See below for labs done prior to her visit.  She is reporting diarrhea as noted in CC above.  Having issues since Christmas.  Stools have been watery for the last few days.  No blood or mucus. This is different from her previous issues with some diarrhea related to eye drops.  Also has some "muscle fatigue" and more sore after exercise, and more overall fatigue.  She is sweating and feeling cold.  Muscle symptoms only started in the last few days. She had been more active the end of last week, and wonders if she is just sore from that.  She is also reporting stuffy nose and nasal congestion--the runny nose is chronic, but stuffy nose started in the last week.  Taking zyrtec (currently, usually takes claritin) daily has helped with runny nose.   Paroxysmal atrial fibrillation: Diagnosed in 2019. She is compliant with Eliquis  (had stopped it at one point due to purpura, but restarted when she had increased afib symptoms).  She last saw Dr. Elberta Fortis in 04/2019, no changes were made at that time.  She is scheduled for cardiology f/u next month. She uses diltiazem just prn, needed to use 4-6x over the last year. This more likely occurs at night; no associated chest pain or dyspnea.  She denies any bleeding. She has been getting less exercise overall in the last year.  For a few weeks she was good at going to the [facility]; she states she went with a younger friend, and had noted feeling somewhat LH after exercising. Doesn't otherwise reported any dizziness.    Hypercholesterolemia:  This has been diet-controlled.  LDL was up to 156 in 03/2018, down to 139 in 03/2019.  HDL has been good, though hasn't really been exercising as much recently. She continues to follow a low cholesterol diet.  Osteoporosis: She continues on Prolia q6 months without side effects. This was started in 05/2015, last injection was in 12/2019. She is s/p kyphoplasty 04/2015. She previously took Actonel x 6 years, changed to Fosamax (insurance reasons), stopped in 11/2011.  Last DEXA was in 08/2018, showing significant improvement in spine (T -1.6, up from -2.5), and stable at hips (lowest -2.0 at L femoral neck).  Postmenopausal with atrophic vaginitis.  She stopped the topical hormones 1-2 years ago.  She isn't in a sexual relationship currently, denies any vaginal discomfort or pain.  She has noted slightly more leakage of urine  in the last year--a slightly larger volume of leakage than previously.  She denies dysuria, hematuria.  Glaucoma--her vision has been stable. She no longer drives at night.  She sees better outdoors than indoors  Immunization History  Administered Date(s) Administered  . Fluad Quad(high Dose 65+) 12/27/2018  . Influenza Split 03/21/2007, 03/20/2008, 01/19/2011  . Influenza, High Dose Seasonal PF 03/04/2015, 03/09/2016, 01/15/2017,  01/17/2018  . Influenza, Seasonal, Injecte, Preservative Fre 02/21/2012  . Influenza,inj,Quad PF,6+ Mos 02/24/2013, 02/25/2014  . PFIZER SARS-COV-2 Vaccination 06/09/2019, 06/30/2019  . PPD Test 02/11/1997  . Pneumococcal Conjugate-13 03/04/2015  . Pneumococcal Polysaccharide-23 03/09/2016  . Td 10/04/1990, 03/18/2001  . Tdap 04/13/2009  . Zoster 08/20/2012   Last Pap smear: 03/2018--normal with no high risk HPV Last mammogram: 08/2019 at [facility] Last colonoscopy: 07/2011 Dr. Loreta Ave; hyperplastic polyps (10 yr f/u recommended) Last DEXA:08/2018 (Prolia started 05/2015);  improvement in spine (T -1.6, up from -2.5), and stable at hips (lowest -2.0 at L femoral neck). Ophtho: every 3-4 months (glaucoma) Dentist: regularly, every 6 months Exercise: Going to the [facility] less often (went for 3-4 weeks, but not going since having to wear masks).  Not as active at home.  She does yoga periodically. Has weights at home, not using recently. Vitamin D level was normal twice in the paston current vitamins(last was 34 in 02/2014).   Other doctors caring for patient include: GI: Dr. Loreta Ave Ophtho: Dr. Emily Filbert (no longer sees) [facility] specialist at [facility]: Dr. Lottie Dawson Dentist: Dr. Ninetta Lights GYN: Dr. Mezer(retired) Cardiologist: Dr. Elberta Fortis (prev Anne Fu) Derm: Dr. Emily Filbert  Depression screen: Negative Fall screen: None Functional status screen--notable forvision issues (glaucoma, sees ophtho),occasional urge/stress incontinence. slight decline in hearing (issues with background noise). No longer drives at night Mini-Cog screen: Normal 3 word recall, clock visualized over video, normal See full screens in epic.  End of Life Discussion: Patienthasa living will and medical power of attorney  PMH, PSH, SH and FH were reviewed  Outpatient Encounter Medications as of 04/05/2020  Medication Sig  . apixaban (ELIQUIS) 5 MG TABS tablet Take 1 tablet (5 mg total) by mouth 2 (two) times daily.  . calcium  citrate-vitamin D (CITRACAL+D) 315-200 MG-UNIT tablet Take 1 tablet by mouth 2 (two) times daily.  . cetirizine (ZYRTEC) 10 MG tablet Take 10 mg by mouth daily.  Marland Kitchen denosumab (PROLIA) 60 MG/ML SOLN injection Inject 60 mg into the skin every 6 (six) months. Administer in upper arm, thigh, or abdomen  . dorzolamide (TRUSOPT) 2 % ophthalmic solution PLACE 1 DROP INTO both EYE(S) THREE TIMES A DAY  . loratadine (CLARITIN) 10 MG tablet Take 10 mg by mouth daily.  . LUTEIN PO Take 1 capsule by mouth daily.  . Multiple Vitamins-Minerals (CENTRUM SILVER PO) Take 1 tablet by mouth daily.  . Netarsudil-Latanoprost (ROCKLATAN) 0.02-0.005 % SOLN Apply 1 drop to eye daily.  . Olopatadine HCl 0.2 % SOLN Apply 1 drop to eye 2 (two) times daily.  . pilocarpine (PILOCAR) 4 % ophthalmic solution Place 1 drop into both eyes 3 (three) times daily.  Marland Kitchen diltiazem (CARDIZEM) 30 MG tablet Take 30 mg by mouth as needed. (Patient not taking: Reported on 04/05/2020)   No facility-administered encounter medications on file as of 04/05/2020.   Allergies  Allergen Reactions  . Brinzolamide-Brimonidine Itching  . Cortisone Other (See Comments)    Entire body hurt from a cortisone shot.  . Fosamax [Alendronate Sodium]     GI upset, bone pain; tolerated Actonel  . Timolol Other (See  Comments)    Slowed her heart rate down  . Tramadol Rash    ROS: The patient denies anorexia, fever, weight changes, headaches, decreased hearing (just mild), ear pain, sore throat, breast concerns, chest pain, dizziness (only per HPI, LH after exercise a few times), syncope, dyspnea on exertion,swelling, nausea, vomiting, abdominal pain,melena, hematochezia, indigestion/heartburn, hematuria,dysuria, nupostmenopausal bleeding, vaginal discharge, odor or itch, genital lesions, numbness, tingling, weakness, tremor, suspicious skin lesions, depression, anxiety, abnormal bleeding/bruising, or enlarged lymph nodes. Some urinary urgency, only rare  leakage (on the way to the bathroom);slight urgency/incontinence when she has colds/cough. Chronic runny nose and eyes--improved since using claritin or zyrtec. 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 Occasional palpitation/tachycardia at night, uses diltiazem prn. Denies bleeding on Eliquis. Has purpura on legs, unchanged. No longer having hip pain Congestion, muscle soreness and diarrhea per HPI   Observations/Objective:  Ht 5\' 7"  (1.702 m)   Wt 140 lb (63.5 kg)   LMP 03/20/2000   BMI 21.93 kg/m   Unable to locate her BP monitor today  Wt Readings from Last 3 Encounters:  04/05/20 140 lb (63.5 kg)  04/22/19 144 lb 9.6 oz (65.6 kg)  03/27/19 141 lb 6.4 oz (64.1 kg)   Patient is alert, oriented, and in no distress.   She sounds mildly congested, occasional throat clearing and rare cough. She is in good spirits--normal mood, affect, grooming. Exam is limited due to virtual nature of the visit.  Lab Results  Component Value Date   CHOL 198 04/02/2020   HDL 51 04/02/2020   LDLCALC 129 (H) 04/02/2020   TRIG 99 04/02/2020   CHOLHDL 3.9 04/02/2020     Chemistry      Component Value Date/Time   NA 145 (H) 04/02/2020 1007   K 4.6 04/02/2020 1007   CL 105 04/02/2020 1007   CO2 23 04/02/2020 1007   BUN 26 04/02/2020 1007   CREATININE 0.99 04/02/2020 1007   CREATININE 1.05 (H) 03/22/2017 0952      Component Value Date/Time   CALCIUM 9.6 04/02/2020 1007   ALKPHOS 46 04/02/2020 1007   AST 14 04/02/2020 1007   ALT 10 04/02/2020 1007   BILITOT 0.3 04/02/2020 1007     Fasting glucose 89  Lab Results  Component Value Date   WBC 7.1 04/02/2020   HGB 13.0 04/02/2020   HCT 39.7 04/02/2020   MCV 93 04/02/2020   PLT 346 04/02/2020    Assessment and Plan:  Medicare annual wellness visit, subsequent  Paroxysmal atrial fibrillation (HCC) - compliant with anticoagulants; uses CCB  prn, infrequent.  has f/u with cardiology soon.  Osteoporosis without current pathological fracture, unspecified osteoporosis type - Continue Prolia, Ca, D.  Encouraged to restart weight-bearing exercise.  DEXA due in June  Pure hypercholesterolemia - diet controlled.  LDL is good; HDL dropped some since less active, still good. Encouraged to resume exercise, and cont to follow low cholesterol diet  Nasal congestion - in the last week, different than her usual chronic runny nose. Given diarrhea and current COVID surge, encouraged testing  Diarrhea, unspecified type - reviewed proper diet (bland, dairy-free, BRAT); recommended COVID test  Vaccine counseling - due for flu shot; COVID booster recommended ASAP if COVID test negative.  Also counseled/rec Tdap and Shingrix from pharmacy, discussed timing   BRAT diet Kegels COVID test Vaccines Schedule CPE within 1-2 mos (30 min slot ok)  Discussed monthly self breast exams and yearly  mammograms; at least 30 minutes of aerobic activity at least 5 days/week, weight-bearing exercise 2x/week; proper sunscreen use reviewed; healthy diet, including goals of calcium and vitamin D intake and alcohol recommendations (less than or equal to 1 drink/day) reviewed; regular seatbelt use; changing batteries in smoke detectors. Immunization recommendations discussed--Continue high dose flu shots yearly (hasn't gotten yet this year).  COVID booster recommended/discussed. Shingrix recommended.Risks/side effects reviewed, to get at pharmacy.Tetanus booster is also due, to get from pharmacy. Colonoscopy recommendations reviewed--UTD, due again 07/2021 DEXA due 08/2020.   Full Code, Full Care MOST form reviewed, unchanged (unable to sign due to virtual nature of the visit)  F/u for CPE, and then in 1 year, sooner prn.   Follow Up Instructions:    I discussed the assessment and treatment plan with the patient. The patient was provided an opportunity to ask  questions and all were answered. The patient agreed with the plan and demonstrated an understanding of the instructions.   I spent 80 minutes dedicated to the care of this patient, including pre-visit review of records, face to face time, post-visit ordering of testing and documentation.    Medicare Attestation I have personally reviewed: The patient's medical and social history Their use of alcohol, tobacco or illicit drugs Their current medications and supplements The patient's functional ability including ADLs,fall risks, home safety risks, cognitive, and hearing and visual impairment Diet and physical activities Evidence for depression or mood disorders  The patient's weight, height and BMI have been recorded in the chart.  I have made referrals, counseling, and provided education to the patient based on review of the above and I have provided the patient with a written personalized care plan for preventive services.    Lavonda Jumbo, MD

## 2020-04-05 ENCOUNTER — Other Ambulatory Visit: Payer: Self-pay

## 2020-04-05 ENCOUNTER — Encounter: Payer: Self-pay | Admitting: Family Medicine

## 2020-04-05 ENCOUNTER — Telehealth (INDEPENDENT_AMBULATORY_CARE_PROVIDER_SITE_OTHER): Payer: Medicare PPO | Admitting: Family Medicine

## 2020-04-05 VITALS — Ht 67.0 in | Wt 140.0 lb

## 2020-04-05 DIAGNOSIS — I480 Paroxysmal atrial fibrillation: Secondary | ICD-10-CM

## 2020-04-05 DIAGNOSIS — Z7185 Encounter for immunization safety counseling: Secondary | ICD-10-CM

## 2020-04-05 DIAGNOSIS — R197 Diarrhea, unspecified: Secondary | ICD-10-CM

## 2020-04-05 DIAGNOSIS — R0981 Nasal congestion: Secondary | ICD-10-CM | POA: Diagnosis not present

## 2020-04-05 DIAGNOSIS — E7800 Pure hypercholesterolemia, unspecified: Secondary | ICD-10-CM | POA: Diagnosis not present

## 2020-04-05 DIAGNOSIS — Z0000 Encounter for general adult medical examination without abnormal findings: Secondary | ICD-10-CM | POA: Diagnosis not present

## 2020-04-05 DIAGNOSIS — M810 Age-related osteoporosis without current pathological fracture: Secondary | ICD-10-CM

## 2020-04-07 NOTE — Progress Notes (Signed)
Pt called and appt  made 

## 2020-04-22 ENCOUNTER — Encounter: Payer: Self-pay | Admitting: Student

## 2020-04-22 ENCOUNTER — Ambulatory Visit (INDEPENDENT_AMBULATORY_CARE_PROVIDER_SITE_OTHER): Payer: Medicare PPO | Admitting: Student

## 2020-04-22 ENCOUNTER — Other Ambulatory Visit: Payer: Self-pay

## 2020-04-22 VITALS — BP 118/68 | HR 47 | Ht 67.0 in | Wt 140.0 lb

## 2020-04-22 DIAGNOSIS — I480 Paroxysmal atrial fibrillation: Secondary | ICD-10-CM | POA: Diagnosis not present

## 2020-04-22 DIAGNOSIS — R001 Bradycardia, unspecified: Secondary | ICD-10-CM | POA: Diagnosis not present

## 2020-04-22 NOTE — Progress Notes (Signed)
PCP:  Paige Arrow, MD Primary Cardiologist: [name], MD Electrophysiologist: Regan Lemming, MD   Paige Klein is a 70 y.o. female seen today for [name], MD for routine electrophysiology followup.  Since last being seen in our clinic the patient reports doing well overall. She has intermittent palpitations and takes prn diltiazem several times weekly. Her palpitations are mostly noticeable in the quiet evenings.  At times, she feels like she has trouble getting her HR up for exercise. She states she maxes out in the 90s. She has occasional lightheadedness but no syncope or near syncope.   Past Medical History:  Diagnosis Date  . A-fib (HCC) 05/22/2017  . Abnormal Pap smear of cervix 1999   ASGUS x 1  . Allergic rhinitis, cause unspecified    mold; and vasomotor rhinitis  . Bradycardia 02/25/2014  . Cataract   . Fibrocystic breast changes    and also breast calcifications (08/2008)  . Glaucoma   . Glaucoma    followed by [facility] Dr. Lottie Dawson  . Glaucoma suspect of both eyes    high-normal intraocular pressures (Dr. Emily Filbert)  . H/O bone density study 09/07/09   08/16/07, 08/08/05; osteoporosis  . H/O mammogram 11/23/10   normal  . Hypercholesteremia    borderline per NMR lipoprofile 2008, 2007  . Internal hemorrhoids   . Kidney stone 1986   right  . Migraine headache    resolved with menopause (menstrual migraines)  . MVP (mitral valve prolapse)    per echo  . Osteoporosis   . Osteoporosis 02/22/2012  . Pap smear for cervical cancer screening 04/14/09   negative for malignancy, +atrophic changes  . Postmenopausal    on compounded cream daily  . Unspecified vitamin D deficiency 2009  . Vitamin D deficiency 02/22/2012   Past Surgical History:  Procedure Laterality Date  . BREAST CYST ASPIRATION     many (Dr. Maryagnes Amos)  . BREAST FIBROADENOMA SURGERY  09-03   Dr.Gooden  . cardiolite (stress test)  11/05   Dr. Ermalinda Memos  . cataract surgery  2010    bilateral  . CERVICAL POLYPECTOMY  2003   treated with cryosurgery  . COLONOSCOPY  08/02/11; 05/2000   Dr. Loreta Ave - rectal polyps 2013  . DILATION AND CURETTAGE OF UTERUS  05/2009   uterine polyp, bleeding  . endocervical cyst  12/96, 6/97   Dr Chevis Pretty  . EYE SURGERY    . KYPHOPLASTY N/A 04/29/2015   Procedure: KYPHOPLASTY;  Surgeon: Estill Bamberg, MD;  Location: [facility] OR;  Service: Orthopedics;  Laterality: N/A;  Lumbar 1 kyphoplasty    Current Outpatient Medications  Medication Sig Dispense Refill  . apixaban (ELIQUIS) 5 MG TABS tablet Take 1 tablet (5 mg total) by mouth 2 (two) times daily. 180 tablet 3  . calcium citrate-vitamin D (CITRACAL+D) 315-200 MG-UNIT tablet Take 1 tablet by mouth 2 (two) times daily.    . cetirizine (ZYRTEC) 10 MG tablet Take 10 mg by mouth daily.    Marland Kitchen denosumab (PROLIA) 60 MG/ML SOLN injection Inject 60 mg into the skin every 6 (six) months. Administer in upper arm, thigh, or abdomen    . diltiazem (CARDIZEM) 30 MG tablet Take 30 mg by mouth as needed.    . dorzolamide (TRUSOPT) 2 % ophthalmic solution PLACE 1 DROP INTO both EYE(S) THREE TIMES A DAY  3  . loratadine (CLARITIN) 10 MG tablet Take 10 mg by mouth daily.    . LUTEIN PO Take 1 capsule  by mouth daily.    . Multiple Vitamins-Minerals (CENTRUM SILVER PO) Take 1 tablet by mouth daily.    . Netarsudil-Latanoprost (ROCKLATAN) 0.02-0.005 % SOLN Apply 1 drop to eye daily.    . Olopatadine HCl 0.2 % SOLN Apply 1 drop to eye 2 (two) times daily.    . pilocarpine (PILOCAR) 4 % ophthalmic solution Place 1 drop into both eyes 3 (three) times daily.     No current facility-administered medications for this visit.    Allergies  Allergen Reactions  . Brinzolamide-Brimonidine Itching  . Cortisone Other (See Comments)    Entire body hurt from a cortisone shot.  . Fosamax [Alendronate Sodium]     GI upset, bone pain; tolerated Actonel  . Timolol Other (See Comments)    Slowed her heart rate down  . Tramadol Rash     Social History   Socioeconomic History  . Marital status: Divorced    Spouse name: Not on file  . Number of children: 0  . Years of education: Not on file  . Highest education level: Not on file  Occupational History  . 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 Smoking status: Never Smoker  . Smokeless tobacco: Never Used  Vaping Use  . Vaping Use: Never used  Substance and Sexual Activity  . Alcohol use: Yes    Alcohol/week: 0.0 standard drinks    Comment: 1-2 glasses/week wine or less  . Drug use: No  . Sexual activity: Not Currently    Partners: Male  Other Topics Concern  . Not on file  Social History Narrative   Divorced, no children, exercise with yoga, goes to the [employer], school psychologist - retired.   Lives alone, 1 cat      Updated 03/2020   Social Determinants of Health   Financial Resource Strain: Not on file  Food Insecurity: Not on file  Transportation Needs: Not on file  Physical Activity: Not on file  Stress: Not on file  Social Connections: Not on file  Intimate Partner Violence: Not on file     Review of Systems: General: No chills, fever, night sweats or weight changes  Cardiovascular:  No chest pain, dyspnea on exertion, edema, orthopnea, palpitations, paroxysmal nocturnal dyspnea Dermatological: No rash, lesions or masses Respiratory: No cough, dyspnea Urologic: No hematuria, dysuria Abdominal: No nausea, vomiting, diarrhea, bright red blood per rectum, melena, or hematemesis Neurologic: No visual changes, weakness, changes in mental status All other systems reviewed and are otherwise negative except as noted above.  Physical Exam: Vitals:   04/22/20 1305  BP: 118/68  Pulse: (!) 47  SpO2: 98%  Weight: 140 lb (63.5 kg)  Height: 5\' 7"  (1.702 m)    GEN- The patient is well appearing, alert and oriented x 3 today.   HEENT: normocephalic, atraumatic; sclera clear,  conjunctiva pink; hearing intact; oropharynx clear; neck supple, no JVP Lymph- no cervical lymphadenopathy Lungs- Clear to ausculation bilaterally, normal work of breathing.  No wheezes, rales, rhonchi Heart- Regular rate and rhythm, no murmurs, rubs or gallops, PMI not laterally displaced GI- soft, non-tender, non-distended, bowel sounds present, no hepatosplenomegaly Extremities- no clubbing, cyanosis, or edema; DP/PT/radial pulses 2+ bilaterally MS- no significant deformity or atrophy Skin- warm and dry, no rash or lesion Psych- euthymic mood, full affect Neuro- strength and sensation are intact  EKG is ordered. Personal review of EKG from today shows Sinus bradycardia at 47 bpm, normal PR interval and  QRS  Additional studies reviewed include: Previous EP office notes  Assessment and Plan:  1. Paroxysmal atrial fibrillation  On Eliquis for CHA2DS2VASC of at least 2.   Continue prn diltiazem.  She is interested in discussing ablation, but would like to put it off "for as long as possible".   2. Sinus bradycardia HR in 40s today. Overall no concerning symptoms.  She is aware if she requires more aggressive management of her AF, she could likely end up requiring a pacemaker.  Continue to follow. Will avoid daily AV nodal blockers.  Will have Dr. Elberta Fortis see her back in 6 months to further assess her PAF burden and potentially discuss ablation. Right now she is doing OK overall and prefers watchful waiting for as long as possible.   Graciella Freer, PA-C  04/22/20 1:11 PM

## 2020-04-22 NOTE — Patient Instructions (Signed)
Medication Instructions:  *If you need a refill on your cardiac medications before your next appointment, please call your pharmacy*  Follow-Up: At [facility], you and your health needs are our priority.  As part of our continuing mission to provide you with exceptional heart care, we have created designated Provider Care Teams.  These Care Teams include your primary Cardiologist (physician) and Advanced Practice Providers (APPs -  Physician Assistants and Nurse Practitioners) who all work together to provide you with the care you need, when you need it.  We recommend signing up for the patient portal called "MyChart".  Sign up information is provided on this After Visit Summary.  MyChart is used to connect with patients for Virtual Visits (Telemedicine).  Patients are able to view lab/test results, encounter notes, upcoming appointments, etc.  Non-urgent messages can be sent to your provider as well.   To learn more about what you can do with MyChart, go to [email or web address].    Your next appointment:   Your physician recommends that you schedule a follow-up appointment in: 6 MONTHS with Dr. Camnitz  The format for your next appointment:   In Person with Will Camnitz, MD     

## 2020-04-27 ENCOUNTER — Other Ambulatory Visit: Payer: Self-pay | Admitting: Cardiology

## 2020-04-27 NOTE — Telephone Encounter (Signed)
Eliquis 5mg  refill request received. Patient is 70 years old, weight-63.5kg, Crea-0.99 on 04/02/2020, Diagnosis-Afib, and last seen by 04/04/2020 on 04/22/2020. Dose is appropriate based on dosing criteria. Will send in refill to requested pharmacy.

## 2020-05-14 DIAGNOSIS — H401123 Primary open-angle glaucoma, left eye, severe stage: Secondary | ICD-10-CM | POA: Diagnosis not present

## 2020-05-18 NOTE — Progress Notes (Signed)
Chief Complaint  Patient presents with  . Annual Exam    Nonfasting annual exam with pelvic and breast exam. Does want to do covid booster yet, will do flu shot.     Paige Klein is a 70 y.o. female who presents for annual physical.  She had her med check and AWV done virtually in January.  At that time she noted some increased runny nose (different from allergies) and some diarrhea. COVID test was recommended. She reports she did home test, which was positive. She is feeling better. Rare cough/sneeze related to allergies.  Bowels are normal.   Immunization History  Administered Date(s) Administered  . Fluad Quad(high Dose 65+) 12/27/2018  . Influenza Split 03/21/2007, 03/20/2008, 01/19/2011  . Influenza, High Dose Seasonal PF 03/04/2015, 03/09/2016, 01/15/2017, 01/17/2018  . Influenza, Seasonal, Injecte, Preservative Fre 02/21/2012  . Influenza,inj,Quad PF,6+ Mos 02/24/2013, 02/25/2014  . PFIZER(Purple Top)SARS-COV-2 Vaccination 06/09/2019, 06/30/2019  . PPD Test 02/11/1997  . Pneumococcal Conjugate-13 03/04/2015  . Pneumococcal Polysaccharide-23 03/09/2016  . Td 10/04/1990, 03/18/2001  . Tdap 04/13/2009  . Zoster 08/20/2012   Last Pap smear: 03/2018--normal with no high risk HPV Last mammogram: 08/2019 at [facility] Last colonoscopy: 07/2011 Dr. Loreta Ave; hyperplastic polyps (10 yr f/u recommended) Last DEXA:08/2018 (Proliastarted 05/2015);  improvement in spine (T -1.6, up from -2.5), and stable at hips (lowest -2.0 at L femoral neck). Ophtho: every 3-4 months (glaucoma) Dentist: regularly, every 6 months Exercise:Going to the [facility] less often (went for 3-4 weeks, but not going since having to wear masks).  Not as active at home.  She does yoga periodically. Has weights at home, not using recently. Vitamin D level was normal twice in the paston current vitamins(last was 34 in 02/2014).   PMH, PSH, SH and FH reviewed and updated.  Outpatient Encounter Medications as of  05/19/2020  Medication Sig  . calcium citrate-vitamin D (CITRACAL+D) 315-200 MG-UNIT tablet Take 1 tablet by mouth 2 (two) times daily.  Marland Kitchen denosumab (PROLIA) 60 MG/ML SOLN injection Inject 60 mg into the skin every 6 (six) months. Administer in upper arm, thigh, or abdomen  . dorzolamide (TRUSOPT) 2 % ophthalmic solution PLACE 1 DROP INTO both EYE(S) THREE TIMES A DAY  . ELIQUIS 5 MG TABS tablet TAKE 1 TABLET BY MOUTH TWICE A DAY  . loratadine (CLARITIN) 10 MG tablet Take 10 mg by mouth daily.  . LUTEIN PO Take 1 capsule by mouth daily.  . Multiple Vitamins-Minerals (CENTRUM SILVER PO) Take 1 tablet by mouth daily.  . Netarsudil-Latanoprost (ROCKLATAN) 0.02-0.005 % SOLN Apply 1 drop to eye daily.  . Olopatadine HCl 0.2 % SOLN Apply 1 drop to eye 2 (two) times daily.  . pilocarpine (PILOCAR) 4 % ophthalmic solution Place 1 drop into both eyes 3 (three) times daily.  . [DISCONTINUED] cetirizine (ZYRTEC) 10 MG tablet Take 10 mg by mouth daily.  Marland Kitchen diltiazem (CARDIZEM) 30 MG tablet Take 30 mg by mouth as needed. (Patient not taking: Reported on 05/19/2020)   No facility-administered encounter medications on file as of 05/19/2020.   Allergies  Allergen Reactions  . Brinzolamide-Brimonidine Itching  . Cortisone Other (See Comments)    Entire body hurt from a cortisone shot.  . Fosamax [Alendronate Sodium]     GI upset, bone pain; tolerated Actonel  . Timolol Other (See Comments)    Slowed her heart rate down  . Tramadol Rash    ROS: The patient denies anorexia, fever, weight changes, headaches, decreased hearing(just mild), ear pain, sore throat,  breast concerns, chest pain, dizziness, syncope, dyspnea on exertion,swelling,nausea, vomiting, abdominal pain,melena, hematochezia, indigestion/heartburn, hematuria,dysuria, no postmenopausal bleeding, vaginal discharge, odor or itch, genital lesions, numbness, tingling, weakness, tremor, suspicious skin lesions, depression, anxiety, abnormal  bleeding/bruising, or enlarged lymph nodes.   Some urinary urgency, only rare leakage (on the way to the bathroom);slight urgency/incontinence when she has colds/cough. Chronic runny nose and eyes--improved since using claritin or zyrtec. 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 Itchy/watery eyes (drops help some). Occasional palpitation/tachycardia at night, uses diltiazem prn. Needed it once 2-3 weeks ago. Denies bleeding on Eliquis.  Has chronic hyperpigmentation on the lower legs, L>R (not purpuric in nature, though this was a term she has used in the past)    PHYSICAL EXAM:  BP (!) 180/80   Pulse (!) 56   Ht 5\' 7"  (1.702 m)   Wt 143 lb 3.2 oz (65 kg)   LMP 03/20/2000   BMI 22.43 kg/m   134/64 on repeat by MD  Wt Readings from Last 3 Encounters:  05/19/20 143 lb 3.2 oz (65 kg)  04/22/20 140 lb (63.5 kg)  04/05/20 140 lb (63.5 kg)    General Appearance:  Alert, cooperative, no distress, appears her age  Head:  Normocephalic, without obvious abnormality, atraumatic   Eyes:  PERRL, EOMI, fundi without hemorrhage or exudate (not as mioitic as in the past, due for her pilocarpine drops); conjunctiva is mildly injected bilaterally.   Ears:  Normal TM's and external ear canals   Nose:  Not examined, wearing mask due to COVID-19 pandemic  Throat:  Not examined, wearing mask due to COVID-19 pandemic  Neck:  Supple, no lymphadenopathy; thyroid: no enlargement/tenderness/nodules; no carotid bruit or JVD   Back:  Spine nontender, no curvature, ROM normal, no CVA tenderness   Lungs:  Clear to auscultation bilaterally without wheezes, rales or rhonchi; respirations unlabored   Chest Wall:  No tenderness or deformity   Heart:  Bradycardic(50's); S1 and S2 normal, no murmur, rub or gallop.   Breast Exam:  No tenderness, masses, or nipple discharge or  inversion. No axillary lymphadenopathy   Abdomen:  Soft, non-tender, nondistended, normoactive bowel sounds, no masses, no hepatosplenomegaly   Genitalia:  Normal external genitalia without lesions. + atrophic changes. No cervical motion tenderness. Uterus and adnexa not enlarged, nontender, no masses. Pap not performed   Rectal:  Normal tone, no masses or tenderness; guaiac negative stool   Extremities:  No clubbing, cyanosis or edema  Pulses:  2+dorsalis pedis and PT pulses  Skin:  Skin color, texture, turgor normal.Hyperpigmented patchesat the anteromedial left lower leg, and two smaller areas on the RLE, unchanged. There is a small, nontender subcutaneous nodule at the right lower neck.  Lymph nodes:  Cervical, supraclavicular, and axillary nodes normal   Neurologic:  Normal strength, sensation and gait; reflexes 2+ and symmetric throughout    Psych:   Normal mood, affect, hygiene and grooming   ASSESSMENT/PLAN:  Annual physical exam  Need for influenza vaccination - Plan: Flu Vaccine QUAD High Dose(Fluad)   Discussed monthly self breast exams and yearly mammograms; at least 30 minutes of aerobic activity at least 5 days/week, weight-bearing exercise 2x/week; proper sunscreen use reviewed; healthy diet, including goals of calcium and vitamin D intake and alcohol recommendations (less than or equal to 1 drink/day) reviewed; regular seatbelt use; changing batteries in smoke detectors. Immunization recommendations discussed--high dose flu shot given today; prefers to wait longer for COVID booster (  recent infection), timing recommendations discussed. Shingrix recommended.Risks/side effects reviewed, to get at pharmacy.Tetanus booster is also due, to get from pharmacy. Discussed timing recommendations of these vaccines. Colonoscopy recommendations reviewed--UTD, due again 07/2021 DEXA due 08/2020 (to get at [facility]).   Full Code, Full  Care MOST form reviewed, unchanged.  F/u in 1 year for CPE/AWV/med check, sooner prn.

## 2020-05-18 NOTE — Patient Instructions (Addendum)
  HEALTH MAINTENANCE RECOMMENDATIONS:  It is recommended that you get at least 30 minutes of aerobic exercise at least 5 days/week (for weight loss, you may need as much as 60-90 minutes). This can be any activity that gets your heart rate up. This can be divided in 10-15 minute intervals if needed, but try and build up your endurance at least once a week.  Weight bearing exercise is also recommended twice weekly.  Eat a healthy diet with lots of vegetables, fruits and fiber.  "Colorful" foods have a lot of vitamins (ie Abbigaile Rockman vegetables, tomatoes, red peppers, etc).  Limit sweet tea, regular sodas and alcoholic beverages, all of which has a lot of calories and sugar.  Up to 1 alcoholic drink daily may be beneficial for women (unless trying to lose weight, watch sugars).  Drink a lot of water.  Calcium recommendations are 1200-1500 mg daily (1500 mg for postmenopausal women or women without ovaries), and vitamin D 1000 IU daily.  This should be obtained from diet and/or supplements (vitamins), and calcium should not be taken all at once, but in divided doses.  Monthly self breast exams and yearly mammograms for women over the age of 39 is recommended.  Sunscreen of at least SPF 30 should be used on all sun-exposed parts of the skin when outside between the hours of 10 am and 4 pm (not just when at beach or pool, but even with exercise, golf, tennis, and yard work!)  Use a sunscreen that says "broad spectrum" so it covers both UVA and UVB rays, and make sure to reapply every 1-2 hours.  Remember to change the batteries in your smoke detectors when changing your clock times in the spring and fall. Carbon monoxide detectors are recommended for your home.  Use your seat belt every time you are in a car, and please drive safely and not be distracted with cell phones and texting while driving.  You were given the flu shot today. Please get TdaP (tetanus and pertussis booster) from the pharmacy (you need to  wait at least 2 weeks from today due to getting the flu shot).  2 weeks after that, I recommend getting a COVID booster (will have been 2 months or more since your infection).  After that, you should start the Shingrix series.  Since you have Medicare, you will need to get this from the pharmacy, as it is covered by Part D.This is a series of 2 injections, spaced 2 months apart.   Vaccines should be separated by 2 weeks from each other (or more), or given the same day.  Schedule your bone density and mammogram for June 2022.  Get back into a regular exercise routine. Having structure to your day helps. Start slow.

## 2020-05-19 ENCOUNTER — Ambulatory Visit: Payer: Medicare PPO | Admitting: Family Medicine

## 2020-05-19 ENCOUNTER — Encounter: Payer: Self-pay | Admitting: Family Medicine

## 2020-05-19 ENCOUNTER — Other Ambulatory Visit: Payer: Self-pay

## 2020-05-19 VITALS — BP 134/64 | HR 56 | Ht 67.0 in | Wt 143.2 lb

## 2020-05-19 DIAGNOSIS — Z23 Encounter for immunization: Secondary | ICD-10-CM

## 2020-05-19 DIAGNOSIS — Z0000 Encounter for general adult medical examination without abnormal findings: Secondary | ICD-10-CM | POA: Diagnosis not present

## 2020-05-24 DIAGNOSIS — H401133 Primary open-angle glaucoma, bilateral, severe stage: Secondary | ICD-10-CM | POA: Diagnosis not present

## 2020-07-12 ENCOUNTER — Other Ambulatory Visit (INDEPENDENT_AMBULATORY_CARE_PROVIDER_SITE_OTHER): Payer: Medicare PPO

## 2020-07-12 DIAGNOSIS — M810 Age-related osteoporosis without current pathological fracture: Secondary | ICD-10-CM | POA: Diagnosis not present

## 2020-07-12 MED ORDER — DENOSUMAB 60 MG/ML ~~LOC~~ SOSY
60.0000 mg | PREFILLED_SYRINGE | Freq: Once | SUBCUTANEOUS | Status: AC
  Administered 2020-07-12: 60 mg via SUBCUTANEOUS

## 2020-09-09 DIAGNOSIS — H401133 Primary open-angle glaucoma, bilateral, severe stage: Secondary | ICD-10-CM | POA: Diagnosis not present

## 2020-10-05 DIAGNOSIS — M8589 Other specified disorders of bone density and structure, multiple sites: Secondary | ICD-10-CM | POA: Diagnosis not present

## 2020-10-05 DIAGNOSIS — Z1231 Encounter for screening mammogram for malignant neoplasm of breast: Secondary | ICD-10-CM | POA: Diagnosis not present

## 2020-10-05 LAB — HM DEXA SCAN

## 2020-10-05 LAB — HM MAMMOGRAPHY

## 2020-10-26 ENCOUNTER — Other Ambulatory Visit: Payer: Self-pay

## 2020-10-26 ENCOUNTER — Emergency Department (HOSPITAL_COMMUNITY): Payer: Medicare PPO

## 2020-10-26 ENCOUNTER — Observation Stay (HOSPITAL_COMMUNITY)
Admission: EM | Admit: 2020-10-26 | Discharge: 2020-10-29 | Disposition: A | Discharge status: Home / Self Care | Payer: Medicare PPO | Attending: Physician Assistant | Admitting: Physician Assistant

## 2020-10-26 DIAGNOSIS — Z8616 Personal history of COVID-19: Secondary | ICD-10-CM | POA: Diagnosis not present

## 2020-10-26 DIAGNOSIS — S82002A Unspecified fracture of left patella, initial encounter for closed fracture: Principal | ICD-10-CM | POA: Insufficient documentation

## 2020-10-26 DIAGNOSIS — S82042A Displaced comminuted fracture of left patella, initial encounter for closed fracture: Secondary | ICD-10-CM | POA: Diagnosis not present

## 2020-10-26 DIAGNOSIS — R5381 Other malaise: Secondary | ICD-10-CM | POA: Diagnosis not present

## 2020-10-26 DIAGNOSIS — S2241XA Multiple fractures of ribs, right side, initial encounter for closed fracture: Secondary | ICD-10-CM | POA: Diagnosis not present

## 2020-10-26 DIAGNOSIS — Z419 Encounter for procedure for purposes other than remedying health state, unspecified: Secondary | ICD-10-CM

## 2020-10-26 DIAGNOSIS — S2249XA Multiple fractures of ribs, unspecified side, initial encounter for closed fracture: Secondary | ICD-10-CM

## 2020-10-26 DIAGNOSIS — R109 Unspecified abdominal pain: Secondary | ICD-10-CM | POA: Insufficient documentation

## 2020-10-26 DIAGNOSIS — Y92410 Unspecified street and highway as the place of occurrence of the external cause: Secondary | ICD-10-CM | POA: Diagnosis not present

## 2020-10-26 DIAGNOSIS — M7989 Other specified soft tissue disorders: Secondary | ICD-10-CM | POA: Diagnosis not present

## 2020-10-26 DIAGNOSIS — Z09 Encounter for follow-up examination after completed treatment for conditions other than malignant neoplasm: Secondary | ICD-10-CM

## 2020-10-26 DIAGNOSIS — S2243XA Multiple fractures of ribs, bilateral, initial encounter for closed fracture: Secondary | ICD-10-CM | POA: Diagnosis not present

## 2020-10-26 DIAGNOSIS — M25569 Pain in unspecified knee: Secondary | ICD-10-CM

## 2020-10-26 DIAGNOSIS — I4891 Unspecified atrial fibrillation: Secondary | ICD-10-CM | POA: Diagnosis not present

## 2020-10-26 DIAGNOSIS — Z20822 Contact with and (suspected) exposure to covid-19: Secondary | ICD-10-CM | POA: Diagnosis not present

## 2020-10-26 DIAGNOSIS — S8992XA Unspecified injury of left lower leg, initial encounter: Secondary | ICD-10-CM | POA: Diagnosis present

## 2020-10-26 DIAGNOSIS — Z7901 Long term (current) use of anticoagulants: Secondary | ICD-10-CM | POA: Insufficient documentation

## 2020-10-26 DIAGNOSIS — M25462 Effusion, left knee: Secondary | ICD-10-CM | POA: Diagnosis not present

## 2020-10-26 DIAGNOSIS — I700 Atherosclerosis of aorta: Secondary | ICD-10-CM | POA: Diagnosis not present

## 2020-10-26 LAB — CBC
HCT: 38.1 % (ref 36.0–46.0)
Hemoglobin: 12.5 g/dL (ref 12.0–15.0)
MCH: 31.3 pg (ref 26.0–34.0)
MCHC: 32.8 g/dL (ref 30.0–36.0)
MCV: 95.3 fL (ref 80.0–100.0)
Platelets: 231 10*3/uL (ref 150–400)
RBC: 4 MIL/uL (ref 3.87–5.11)
RDW: 13.2 % (ref 11.5–15.5)
WBC: 10.6 10*3/uL — ABNORMAL HIGH (ref 4.0–10.5)
nRBC: 0 % (ref 0.0–0.2)

## 2020-10-26 LAB — SAMPLE TO BLOOD BANK

## 2020-10-26 MED ORDER — MORPHINE SULFATE (PF) 4 MG/ML IV SOLN
4.0000 mg | Freq: Once | INTRAVENOUS | Status: AC
  Administered 2020-10-26: 4 mg via INTRAVENOUS
  Filled 2020-10-26: qty 1
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
## 2020-10-27 ENCOUNTER — Observation Stay (HOSPITAL_COMMUNITY): Payer: Medicare PPO

## 2020-10-27 ENCOUNTER — Encounter (HOSPITAL_COMMUNITY): Payer: Self-pay

## 2020-10-27 ENCOUNTER — Encounter (HOSPITAL_COMMUNITY)
Admission: EM | Disposition: A | Discharge status: Home / Self Care | Payer: Self-pay | Source: Home / Self Care | Attending: Emergency Medicine

## 2020-10-27 ENCOUNTER — Emergency Department (HOSPITAL_COMMUNITY): Payer: Medicare PPO

## 2020-10-27 ENCOUNTER — Observation Stay (HOSPITAL_COMMUNITY): Payer: Medicare PPO | Admitting: Certified Registered"

## 2020-10-27 DIAGNOSIS — R109 Unspecified abdominal pain: Secondary | ICD-10-CM | POA: Diagnosis not present

## 2020-10-27 DIAGNOSIS — Z8616 Personal history of COVID-19: Secondary | ICD-10-CM | POA: Diagnosis not present

## 2020-10-27 DIAGNOSIS — S82002A Unspecified fracture of left patella, initial encounter for closed fracture: Secondary | ICD-10-CM | POA: Diagnosis not present

## 2020-10-27 DIAGNOSIS — S2241XA Multiple fractures of ribs, right side, initial encounter for closed fracture: Secondary | ICD-10-CM | POA: Diagnosis not present

## 2020-10-27 DIAGNOSIS — S83422A Sprain of lateral collateral ligament of left knee, initial encounter: Secondary | ICD-10-CM | POA: Diagnosis not present

## 2020-10-27 DIAGNOSIS — M7989 Other specified soft tissue disorders: Secondary | ICD-10-CM | POA: Diagnosis not present

## 2020-10-27 DIAGNOSIS — G8918 Other acute postprocedural pain: Secondary | ICD-10-CM | POA: Diagnosis not present

## 2020-10-27 DIAGNOSIS — Z9889 Other specified postprocedural states: Secondary | ICD-10-CM | POA: Diagnosis not present

## 2020-10-27 DIAGNOSIS — E559 Vitamin D deficiency, unspecified: Secondary | ICD-10-CM | POA: Diagnosis not present

## 2020-10-27 DIAGNOSIS — Z7901 Long term (current) use of anticoagulants: Secondary | ICD-10-CM | POA: Diagnosis not present

## 2020-10-27 DIAGNOSIS — Z20822 Contact with and (suspected) exposure to covid-19: Secondary | ICD-10-CM | POA: Diagnosis not present

## 2020-10-27 DIAGNOSIS — I4891 Unspecified atrial fibrillation: Secondary | ICD-10-CM | POA: Diagnosis not present

## 2020-10-27 DIAGNOSIS — S83412A Sprain of medial collateral ligament of left knee, initial encounter: Secondary | ICD-10-CM | POA: Diagnosis not present

## 2020-10-27 DIAGNOSIS — S82092A Other fracture of left patella, initial encounter for closed fracture: Secondary | ICD-10-CM | POA: Diagnosis not present

## 2020-10-27 DIAGNOSIS — S2243XA Multiple fractures of ribs, bilateral, initial encounter for closed fracture: Secondary | ICD-10-CM | POA: Diagnosis not present

## 2020-10-27 DIAGNOSIS — S2249XA Multiple fractures of ribs, unspecified side, initial encounter for closed fracture: Secondary | ICD-10-CM | POA: Diagnosis present

## 2020-10-27 DIAGNOSIS — S86812A Strain of other muscle(s) and tendon(s) at lower leg level, left leg, initial encounter: Secondary | ICD-10-CM | POA: Diagnosis not present

## 2020-10-27 HISTORY — PX: PATELLECTOMY: SHX1022

## 2020-10-27 HISTORY — PX: PATELLAR TENDON REPAIR: SHX737

## 2020-10-27 LAB — BASIC METABOLIC PANEL
Anion gap: 9 (ref 5–15)
Anion gap: 8 (ref 5–15)
BUN: 22 mg/dL (ref 8–23)
BUN: 19 mg/dL (ref 8–23)
CO2: 22 mmol/L (ref 22–32)
CO2: 21 mmol/L — ABNORMAL LOW (ref 22–32)
Calcium: 9.1 mg/dL (ref 8.9–10.3)
Calcium: 8.4 mg/dL — ABNORMAL LOW (ref 8.9–10.3)
Chloride: 106 mmol/L (ref 98–111)
Chloride: 106 mmol/L (ref 98–111)
Creatinine, Ser: 0.98 mg/dL (ref 0.44–1.00)
Creatinine, Ser: 0.95 mg/dL (ref 0.44–1.00)
GFR, Estimated: >60 mL/min (ref >60)
GFR, Estimated: >60 mL/min (ref >60)
Glucose, Bld: 140 mg/dL — ABNORMAL HIGH (ref 70–99)
Glucose, Bld: 116 mg/dL — ABNORMAL HIGH (ref 70–99)
Potassium: 4.5 mmol/L (ref 3.5–5.1)
Potassium: 4.6 mmol/L (ref 3.5–5.1)
Sodium: 137 mmol/L (ref 135–145)
Sodium: 135 mmol/L (ref 135–145)

## 2020-10-27 LAB — CBC
HCT: 35.4 % — ABNORMAL LOW (ref 36.0–46.0)
Hemoglobin: 11.1 g/dL — ABNORMAL LOW (ref 12.0–15.0)
MCH: 30.3 pg (ref 26.0–34.0)
MCHC: 31.4 g/dL (ref 30.0–36.0)
MCV: 96.7 fL (ref 80.0–100.0)
Platelets: 241 10*3/uL (ref 150–400)
RBC: 3.66 MIL/uL — ABNORMAL LOW (ref 3.87–5.11)
RDW: 13.3 % (ref 11.5–15.5)
WBC: 9.5 10*3/uL (ref 4.0–10.5)
nRBC: 0 % (ref 0.0–0.2)

## 2020-10-27 LAB — RESP PANEL BY RT-PCR (FLU A&B, COVID) ARPGX2
Influenza A by PCR: NEGATIVE
Influenza B by PCR: NEGATIVE
SARS Coronavirus 2 by RT PCR: NEGATIVE

## 2020-10-27 LAB — SURGICAL PCR SCREEN
MRSA, PCR: NEGATIVE
Staphylococcus aureus: NEGATIVE

## 2020-10-27 SURGERY — REPAIR, TENDON, PATELLAR
Anesthesia: General | Site: Knee | Laterality: Left

## 2020-10-27 MED ORDER — ORAL CARE MOUTH RINSE: 15.0000 mL | Freq: Once | OROMUCOSAL | Status: AC

## 2020-10-27 MED ORDER — FENTANYL CITRATE (PF) 250 MCG/5ML IJ SOLN
INTRAMUSCULAR | Status: AC
  Filled 2020-10-27: qty 5

## 2020-10-27 MED ORDER — LACTATED RINGERS IV SOLN: INTRAVENOUS | Status: DC

## 2020-10-27 MED ORDER — DEXAMETHASONE SODIUM PHOSPHATE 10 MG/ML IJ SOLN
INTRAMUSCULAR | Status: AC
  Filled 2020-10-27: qty 1

## 2020-10-27 MED ORDER — ROCURONIUM BROMIDE 100 MG/10ML IV SOLN
INTRAVENOUS | Status: DC | PRN
  Administered 2020-10-27: 50 mg via INTRAVENOUS

## 2020-10-27 MED ORDER — EPHEDRINE SULFATE-NACL 50-0.9 MG/10ML-% IV SOSY
PREFILLED_SYRINGE | INTRAVENOUS | Status: DC | PRN
  Administered 2020-10-27: 10 mg via INTRAVENOUS
  Administered 2020-10-27 (×2): 5 mg via INTRAVENOUS

## 2020-10-27 MED ORDER — FENTANYL CITRATE (PF) 250 MCG/5ML IJ SOLN
INTRAMUSCULAR | Status: DC | PRN
  Administered 2020-10-27: 100 ug via INTRAVENOUS

## 2020-10-27 MED ORDER — NETARSUDIL-LATANOPROST 0.02-0.005 % OP SOLN
1.0000 [drp] | Freq: Every day | OPHTHALMIC | Status: DC
  Administered 2020-10-28: 1 [drp] via OPHTHALMIC

## 2020-10-27 MED ORDER — OXYCODONE HCL 5 MG PO TABS
5.0000 mg | ORAL_TABLET | Freq: Once | ORAL | Status: DC | PRN
Start: 2020-10-27 — End: 2020-10-27

## 2020-10-27 MED ORDER — 0.9 % SODIUM CHLORIDE (POUR BTL) OPTIME
TOPICAL | Status: DC | PRN
  Administered 2020-10-27: 1000 mL

## 2020-10-27 MED ORDER — ENOXAPARIN SODIUM 30 MG/0.3ML IJ SOSY
30.0000 mg | PREFILLED_SYRINGE | Freq: Two times a day (BID) | INTRAMUSCULAR | Status: DC
  Administered 2020-10-28 (×2): 30 mg via SUBCUTANEOUS
  Filled 2020-10-27 (×2): qty 0.3

## 2020-10-27 MED ORDER — DORZOLAMIDE HCL 2 % OP SOLN
1.0000 [drp] | Freq: Three times a day (TID) | OPHTHALMIC | Status: DC
  Administered 2020-10-28 – 2020-10-29 (×4): 1 [drp] via OPHTHALMIC

## 2020-10-27 MED ORDER — OLOPATADINE HCL 0.1 % OP SOLN
1.0000 [drp] | Freq: Two times a day (BID) | OPHTHALMIC | Status: DC
  Administered 2020-10-27 – 2020-10-29 (×4): 1 [drp] via OPHTHALMIC

## 2020-10-27 MED ORDER — MIDAZOLAM HCL 2 MG/2ML IJ SOLN
INTRAMUSCULAR | Status: DC | PRN
  Administered 2020-10-27: 1 mg via INTRAVENOUS

## 2020-10-27 MED ORDER — ONDANSETRON 4 MG PO TBDP: 4.0000 mg | ORAL_TABLET | Freq: Four times a day (QID) | ORAL | Status: DC | PRN

## 2020-10-27 MED ORDER — METHOCARBAMOL 500 MG PO TABS
500.0000 mg | ORAL_TABLET | Freq: Four times a day (QID) | ORAL | Status: DC | PRN
  Administered 2020-10-28: 500 mg via ORAL
  Filled 2020-10-27: qty 1

## 2020-10-27 MED ORDER — CHLORHEXIDINE GLUCONATE 0.12 % MT SOLN
OROMUCOSAL | Status: AC
  Administered 2020-10-27: 15 mL
  Filled 2020-10-27: qty 15

## 2020-10-27 MED ORDER — DEXAMETHASONE SODIUM PHOSPHATE 10 MG/ML IJ SOLN
INTRAMUSCULAR | Status: DC | PRN
  Administered 2020-10-27: 5 mg via INTRAVENOUS

## 2020-10-27 MED ORDER — SUGAMMADEX SODIUM 200 MG/2ML IV SOLN
INTRAVENOUS | Status: DC | PRN
  Administered 2020-10-27: 127 mg via INTRAVENOUS

## 2020-10-27 MED ORDER — MIDAZOLAM HCL 2 MG/2ML IJ SOLN: INTRAMUSCULAR | Status: DC | PRN

## 2020-10-27 MED ORDER — LIDOCAINE 2% (20 MG/ML) 5 ML SYRINGE
INTRAMUSCULAR | Status: DC | PRN
  Administered 2020-10-27: 60 mg via INTRAVENOUS

## 2020-10-27 MED ORDER — SODIUM CHLORIDE 0.9 % IV SOLN: INTRAVENOUS | Status: DC

## 2020-10-27 MED ORDER — VANCOMYCIN HCL 1000 MG IV SOLR
INTRAVENOUS | Status: AC
  Filled 2020-10-27: qty 1000

## 2020-10-27 MED ORDER — BISACODYL 10 MG RE SUPP: 10.0000 mg | Freq: Every day | RECTAL | Status: DC | PRN

## 2020-10-27 MED ORDER — VANCOMYCIN HCL 1000 MG IV SOLR
INTRAVENOUS | Status: DC | PRN
  Administered 2020-10-27: 1000 mg via TOPICAL

## 2020-10-27 MED ORDER — OXYCODONE HCL 5 MG PO TABS
5.0000 mg | ORAL_TABLET | ORAL | Status: DC | PRN
  Administered 2020-10-28 (×2): 5 mg via ORAL
  Filled 2020-10-27 (×2): qty 1

## 2020-10-27 MED ORDER — CHLORHEXIDINE GLUCONATE 4 % EX LIQD: 60.0000 mL | Freq: Once | CUTANEOUS | Status: DC

## 2020-10-27 MED ORDER — POVIDONE-IODINE 10 % EX SWAB
2.0000 "application " | Freq: Once | CUTANEOUS | Status: AC
  Administered 2020-10-27: 2 via TOPICAL

## 2020-10-27 MED ORDER — MORPHINE SULFATE (PF) 4 MG/ML IV SOLN
4.0000 mg | Freq: Once | INTRAVENOUS | Status: AC
  Administered 2020-10-27: 4 mg via INTRAVENOUS
  Filled 2020-10-27: qty 1

## 2020-10-27 MED ORDER — ALBUMIN HUMAN 5 % IV SOLN: INTRAVENOUS | Status: DC | PRN

## 2020-10-27 MED ORDER — PHENYLEPHRINE HCL-NACL 20-0.9 MG/250ML-% IV SOLN
INTRAVENOUS | Status: DC | PRN
  Administered 2020-10-27: 40 ug/min via INTRAVENOUS

## 2020-10-27 MED ORDER — HYDRALAZINE HCL 20 MG/ML IJ SOLN: 10.0000 mg | INTRAMUSCULAR | Status: DC | PRN

## 2020-10-27 MED ORDER — CEFAZOLIN SODIUM-DEXTROSE 1-4 GM/50ML-% IV SOLN
1.0000 g | Freq: Three times a day (TID) | INTRAVENOUS | Status: AC
  Administered 2020-10-28 (×2): 1 g via INTRAVENOUS
  Filled 2020-10-27 (×2): qty 50

## 2020-10-27 MED ORDER — HYDROMORPHONE HCL 1 MG/ML IJ SOLN: 0.5000 mg | INTRAMUSCULAR | Status: DC | PRN

## 2020-10-27 MED ORDER — IOHEXOL 300 MG/ML  SOLN
100.0000 mL | Freq: Once | INTRAMUSCULAR | Status: AC | PRN
  Administered 2020-10-27: 100 mL via INTRAVENOUS

## 2020-10-27 MED ORDER — OXYCODONE HCL 5 MG/5ML PO SOLN: 5.0000 mg | Freq: Once | ORAL | Status: DC | PRN

## 2020-10-27 MED ORDER — HYDROMORPHONE HCL 1 MG/ML IJ SOLN: 0.2500 mg | INTRAMUSCULAR | Status: DC | PRN

## 2020-10-27 MED ORDER — ONDANSETRON HCL 4 MG/2ML IJ SOLN: 4.0000 mg | Freq: Four times a day (QID) | INTRAMUSCULAR | Status: DC | PRN

## 2020-10-27 MED ORDER — ONDANSETRON HCL 4 MG/2ML IJ SOLN
INTRAMUSCULAR | Status: DC | PRN
  Administered 2020-10-27: 4 mg via INTRAVENOUS

## 2020-10-27 MED ORDER — PROPOFOL 10 MG/ML IV BOLUS
INTRAVENOUS | Status: DC | PRN
  Administered 2020-10-27: 100 mg via INTRAVENOUS
  Administered 2020-10-27: 40 mg via INTRAVENOUS

## 2020-10-27 MED ORDER — CEFAZOLIN SODIUM-DEXTROSE 2-4 GM/100ML-% IV SOLN
2.0000 g | INTRAVENOUS | Status: AC
  Administered 2020-10-27: 2 g via INTRAVENOUS
  Filled 2020-10-27: qty 100

## 2020-10-27 MED ORDER — IBUPROFEN 200 MG PO TABS
800.0000 mg | ORAL_TABLET | Freq: Three times a day (TID) | ORAL | Status: DC
  Administered 2020-10-27 – 2020-10-28 (×4): 800 mg via ORAL
  Filled 2020-10-27 (×4): qty 4
  Filled 2020-10-27: qty 1

## 2020-10-27 MED ORDER — GABAPENTIN 300 MG PO CAPS
300.0000 mg | ORAL_CAPSULE | Freq: Three times a day (TID) | ORAL | Status: DC
  Administered 2020-10-27 – 2020-10-29 (×5): 300 mg via ORAL
  Filled 2020-10-27 (×6): qty 1

## 2020-10-27 MED ORDER — PHENYLEPHRINE 40 MCG/ML (10ML) SYRINGE FOR IV PUSH (FOR BLOOD PRESSURE SUPPORT)
PREFILLED_SYRINGE | INTRAVENOUS | Status: DC | PRN
  Administered 2020-10-27: 80 ug via INTRAVENOUS
  Administered 2020-10-27: 40 ug via INTRAVENOUS
  Administered 2020-10-27: 80 ug via INTRAVENOUS
  Administered 2020-10-27: 40 ug via INTRAVENOUS

## 2020-10-27 MED ORDER — PILOCARPINE HCL 4 % OP SOLN
1.0000 [drp] | Freq: Three times a day (TID) | OPHTHALMIC | Status: DC
  Administered 2020-10-27 – 2020-10-29 (×5): 1 [drp] via OPHTHALMIC

## 2020-10-27 MED ORDER — ONDANSETRON HCL 4 MG/2ML IJ SOLN
INTRAMUSCULAR | Status: AC
  Filled 2020-10-27: qty 2

## 2020-10-27 MED ORDER — CHLORHEXIDINE GLUCONATE 0.12 % MT SOLN: 15.0000 mL | Freq: Once | OROMUCOSAL | Status: AC

## 2020-10-27 MED ORDER — DOCUSATE SODIUM 100 MG PO CAPS
100.0000 mg | ORAL_CAPSULE | Freq: Two times a day (BID) | ORAL | Status: DC
  Administered 2020-10-27 – 2020-10-28 (×2): 100 mg via ORAL
  Filled 2020-10-27 (×5): qty 1

## 2020-10-27 MED ORDER — ENOXAPARIN SODIUM 30 MG/0.3ML IJ SOSY: 30.0000 mg | PREFILLED_SYRINGE | Freq: Two times a day (BID) | INTRAMUSCULAR | Status: DC

## 2020-10-27 MED ORDER — PROPOFOL 10 MG/ML IV BOLUS
INTRAVENOUS | Status: AC
  Filled 2020-10-27: qty 20

## 2020-10-27 MED ORDER — MIDAZOLAM HCL 2 MG/2ML IJ SOLN
INTRAMUSCULAR | Status: AC
  Filled 2020-10-27: qty 2

## 2020-10-27 MED ORDER — ACETAMINOPHEN 325 MG PO TABS
650.0000 mg | ORAL_TABLET | Freq: Four times a day (QID) | ORAL | Status: DC
  Administered 2020-10-27 (×2): 650 mg via ORAL
  Filled 2020-10-27 (×2): qty 2

## 2020-10-27 MED ORDER — ONDANSETRON HCL 4 MG/2ML IJ SOLN: 4.0000 mg | Freq: Once | INTRAMUSCULAR | Status: DC | PRN

## 2020-10-27 MED ORDER — DIPHENHYDRAMINE HCL 25 MG PO CAPS: 25.0000 mg | ORAL_CAPSULE | Freq: Four times a day (QID) | ORAL | Status: DC | PRN

## 2020-10-27 SURGICAL SUPPLY — 59 items
ANCHOR CORKSCREW BIO 5.5 (Anchor) ×2 IMPLANT
ANCHOR PEEK 4.75X19.1 SWLK C (Anchor) ×1 IMPLANT
BAG COUNTER SPONGE SURGICOUNT (BAG) ×1 IMPLANT
BLADE SURG 10 STRL SS (BLADE) ×1 IMPLANT
BLADE SURG 15 STRL LF DISP TIS (BLADE) IMPLANT
BLADE SURG 15 STRL SS (BLADE) ×2
BNDG ELASTIC 6X5.8 VLCR STR LF (GAUZE/BANDAGES/DRESSINGS) ×1 IMPLANT
BNDG GAUZE ELAST 4 BULKY (GAUZE/BANDAGES/DRESSINGS) ×1 IMPLANT
BRUSH SCRUB EZ PLAIN DRY (MISCELLANEOUS) ×2 IMPLANT
CLSR STERI-STRIP ANTIMIC 1/2X4 (GAUZE/BANDAGES/DRESSINGS) ×1 IMPLANT
COVER SURGICAL LIGHT HANDLE (MISCELLANEOUS) ×1 IMPLANT
CUFF TOURN SGL QUICK 24 (TOURNIQUET CUFF) ×2
CUFF TRNQT CYL 24X4X16.5-23 (TOURNIQUET CUFF) IMPLANT
DRAPE C-ARM 42X72 X-RAY (DRAPES) ×1 IMPLANT
DRAPE C-ARMOR (DRAPES) ×1 IMPLANT
DRSG PAD ABDOMINAL 8X10 ST (GAUZE/BANDAGES/DRESSINGS) ×2 IMPLANT
ELECT REM PT RETURN 9FT ADLT (ELECTROSURGICAL) ×2
ELECTRODE REM PT RTRN 9FT ADLT (ELECTROSURGICAL) IMPLANT
GAUZE SPONGE 4X4 12PLY STRL (GAUZE/BANDAGES/DRESSINGS) ×1 IMPLANT
GLOVE SRG 8 PF TXTR STRL LF DI (GLOVE) IMPLANT
GLOVE SURG UNDER POLY LF SZ7.5 (GLOVE) ×2 IMPLANT
GLOVE SURG UNDER POLY LF SZ8 (GLOVE) ×4
GOWN SRG XL LVL 4 BRTHBL STRL (GOWNS) IMPLANT
GOWN STRL NON-REIN XL LVL4 (GOWNS) ×6
KIT BASIN OR (CUSTOM PROCEDURE TRAY) ×1 IMPLANT
KIT TRANSTIBIAL (DISPOSABLE) ×1 IMPLANT
KIT TURNOVER KIT B (KITS) ×1 IMPLANT
NDL TAPERED W/ NITINOL LOOP (MISCELLANEOUS) IMPLANT
NEEDLE 22X1 1/2 (OR ONLY) (NEEDLE) ×1 IMPLANT
NEEDLE TAPERED W/ NITINOL LOOP (MISCELLANEOUS) ×2 IMPLANT
NS IRRIG 1000ML POUR BTL (IV SOLUTION) ×1 IMPLANT
PACK ORTHO EXTREMITY (CUSTOM PROCEDURE TRAY) ×1 IMPLANT
PAD ABD 8X10 STRL (GAUZE/BANDAGES/DRESSINGS) ×1 IMPLANT
PAD ARMBOARD 7.5X6 YLW CONV (MISCELLANEOUS) ×2 IMPLANT
PADDING CAST COTTON 6X4 STRL (CAST SUPPLIES) ×1 IMPLANT
RETRIEVER SUT HEWSON (MISCELLANEOUS) ×1 IMPLANT
STAPLER VISISTAT 35W (STAPLE) ×1 IMPLANT
SUCTION FRAZIER HANDLE 10FR (MISCELLANEOUS) ×2
SUCTION TUBE FRAZIER 10FR DISP (MISCELLANEOUS) IMPLANT
SUT FIBERWIRE #2 38 T-5 BLUE (SUTURE) ×4
SUT FIBERWIRE #5 38 CONV NDL (SUTURE) ×4
SUT MNCRL AB 4-0 PS2 18 (SUTURE) ×1 IMPLANT
SUT STEEL 5 V 56 M (SUTURE) ×1 IMPLANT
SUT VIC AB 0 CT1 27 (SUTURE) ×2
SUT VIC AB 0 CT1 27XBRD ANBCTR (SUTURE) IMPLANT
SUT VIC AB 1 CTX 18 (SUTURE) ×2 IMPLANT
SUT VIC AB 2-0 CT1 27 (SUTURE) ×4
SUT VIC AB 2-0 CT1 TAPERPNT 27 (SUTURE) IMPLANT
SUT VIC AB 3-0 SH 27 (SUTURE) ×2
SUT VIC AB 3-0 SH 27X BRD (SUTURE) IMPLANT
SUTURE FIBERWR #2 38 T-5 BLUE (SUTURE) IMPLANT
SUTURE FIBERWR #5 38 CONV NDL (SUTURE) IMPLANT
SYR CONTROL 10ML LL (SYRINGE) ×1 IMPLANT
TOWEL GREEN STERILE (TOWEL DISPOSABLE) ×1 IMPLANT
TOWEL GREEN STERILE FF (TOWEL DISPOSABLE) ×1 IMPLANT
TUBE CONNECTING 12X1/4 (SUCTIONS) ×1 IMPLANT
UNDERPAD 30X36 HEAVY ABSORB (UNDERPADS AND DIAPERS) ×1 IMPLANT
WATER STERILE IRR 1000ML POUR (IV SOLUTION) ×1 IMPLANT
YANKAUER SUCT BULB TIP NO VENT (SUCTIONS) ×1 IMPLANT

## 2020-10-27 NOTE — ED Notes (Signed)
Incentive spirometry done x  3 attempts 1st attempt 1500 2nd -1500 3rd 1200  Goal 2150.

## 2020-10-27 NOTE — Anesthesia Preprocedure Evaluation (Signed)
Anesthesia Evaluation  Patient identified by MRN, date of birth, ID band Patient awake    Reviewed: Allergy & Precautions, NPO status , Patient's Chart, lab work & pertinent test results  Airway Mallampati: II  TM Distance: >3 FB Neck ROM: Full    Dental no notable dental hx.    Pulmonary neg pulmonary ROS,    Pulmonary exam normal breath sounds clear to auscultation       Cardiovascular Normal cardiovascular exam+ dysrhythmias Atrial Fibrillation  Rhythm:Regular Rate:Normal     Neuro/Psych negative neurological ROS  negative psych ROS   GI/Hepatic negative GI ROS, Neg liver ROS,   Endo/Other  negative endocrine ROS  Renal/GU negative Renal ROS  negative genitourinary   Musculoskeletal negative musculoskeletal ROS (+)   Abdominal   Peds negative pediatric ROS (+)  Hematology negative hematology ROS (+)   Anesthesia Other Findings   Reproductive/Obstetrics negative OB ROS                             Anesthesia Physical Anesthesia Plan  ASA: 3  Anesthesia Plan: General   Post-op Pain Management:    Induction: Intravenous  PONV Risk Score and Plan: 3 and Ondansetron, Dexamethasone and Treatment may vary due to age or medical condition  Airway Management Planned: LMA  Additional Equipment:   Intra-op Plan:   Post-operative Plan: Extubation in OR  Informed Consent: I have reviewed the patients History and Physical, chart, labs and discussed the procedure including the risks, benefits and alternatives for the proposed anesthesia with the patient or authorized representative who has indicated his/her understanding and acceptance.     Dental advisory given  Plan Discussed with: CRNA and Surgeon  Anesthesia Plan Comments:         Anesthesia Quick Evaluation

## 2020-10-27 NOTE — ED Notes (Signed)
Attempted to give to floor [phone]

## 2020-10-27 NOTE — Progress Notes (Signed)
PT Cancellation Note  Patient Details Name: Paige Klein MRN: [id number] DOB: May 26, 1950   Cancelled Treatment:    Reason Eval/Treat Not Completed: Patient not medically ready. Awaiting orthopedic consult for L patellar fx. Will assess once seen by ortho and WBS/ROM restrictions clarified as schedule allows.   Johnn Hai, SPT Johnn Hai 10/27/2020, 10:14 AM

## 2020-10-27 NOTE — TOC CAGE-AID Note (Signed)
Transition of Care [facility]) - CAGE-AID Screening   Patient Details  Name: FREDIA CHITTENDEN MRN: [id number] Date of Birth: 09-03-1950  Transition of Care [facility]) CM/SW Contact:    Nikodem Leadbetter C Tarpley-Carter, LCSWA Phone Number: 10/27/2020, 3:19 PM   Clinical Narrative: Pt participated in Cage-Aid.  Pt stated she does not use substance, but drinks ETOH socially.  Pt was not offered resources, due to substance and ETOH not affecting day to day activities.    Lessa Huge Tarpley-Carter, MSW, LCSW-A Pronouns:  She/Her/Hers [facility] Health[facility]itions of Care Clinical Social Worker Direct Number:  [phone] [email or web address]   CAGE-AID Screening:    Have You Ever Felt You Ought to Cut Down on Your Drinking or Drug Use?: No Have People Annoyed You By [employer] Your Drinking Or Drug Use?: No Have You Felt Bad Or Guilty About Your Drinking Or Drug Use?: No Have You Ever Had a Drink or Used Drugs First Thing In The Morning to Steady Your Nerves or to Get Rid of a Hangover?: No CAGE-AID Score: 0  Substance Abuse Education Offered: No

## 2020-10-27 NOTE — Op Note (Signed)
Orthopaedic Surgery Operative Note (CSN: [id number])  Paige Klein  1950-08-11 Date of Surgery: 10/27/2020   Diagnoses:  Left complex patellar fracture  Procedure: Left partial patellectomy and patellar tendon repair Medial retinacular repair Lateral retinacular repair   Operative Finding Patient's patella fracture was an inferior pole patella fracture involving about 30% of the inferior pole and it was far too comminuted to hold screws and hardware.  We instead performed a partial patellectomy me and repaired the patellar tendon.  Repaired the medial lateral retinaculum.  We performed an internal brace taking a fiber tape suture at the bone tendon junction of the quadriceps and the patella and anchoring it in the tibial tubercle region of the tibia.  We had good support and our repair was robust at 45 degrees of flexion.  Post-operative plan: The patient will be readmitted to the floor.  The patient will be weightbearing to tolerance in a knee immobilizer transition to a hinged brace locked in extension.  DVT prophylaxis Lovenox 40 mg/day until mobilizing and then consider transition in clinic to alternative medicines.   Pain control with PRN pain medication preferring oral medicines.  Follow up plan will be scheduled in approximately 7 days for incision check and XR.  Post-Op Diagnosis: Same Surgeons:Primary: Paige Pippin, MD Assistants:Caroline McBane PA-C Location: [facility] OR ROOM 05 Anesthesia: General with regional anesthesia Antibiotics: Ancef 2 g with local vancomycin powder 1 g at the surgical site Tourniquet time: 35 Estimated Blood Loss: Minimal Complications: None Specimens: None Implants: * No implants in log *  Indications for Surgery:   Paige Klein is a 70 y.o. female with injury resulting in a complex patella fracture with inferior pole comminution.  She was initially on the schedule for one of my colleagues but due to a medical emergency I was asked to  help.  Benefits and risks of operative and nonoperative management were discussed prior to surgery with patient/guardian(s) and informed consent form was completed.  Specific risks including infection, need for additional surgery, postoperative arthrosis, rerupture, stiffness, incisional healing issues.   Procedure:   The patient was identified properly. Informed consent was obtained and the surgical site was marked. The patient was taken up to suite where general anesthesia was induced.  The patient was positioned supine on a regular bed.  The left knee was prepped and draped in the usual sterile fashion.  Timeout was performed before the beginning of the case.  Tourniquet was used for the above duration.  Began with a open approach over the knee directly.  With the skin sharp achieving hemostasis we progressed.  Made full-thickness skin flaps were able to identify the fracture site.  We cleared fracture hematoma and identified the borders of the patellar tendon.  The patella inferiorly was comminuted into 8-10 fragments and was clearly not amenable to open reduction terminal fixation.  We instead decided to do a partial patellectomy me removing the bony fragments that were large enough to impinge and clearing the joint to ensure there is no loose fragments there.  We left some of the cancellous bone fragments attached to the tendon for bone tendon healing.  We then placed 2 Arthrex 5.5 mm corkscrew anchors at the inferior pole of the fractured patella and obtain reasonable purchase with these.  We then took 1 limb of each suture from each anchor and went from proximal to distal and back proximal in a locking Krakw fashion obtaining purchase of the patellar tendon.  We then took the  alternate limb of this same suture and placed it coming out of the superficial surface of the patellar tendon.  This allowed [place] to pull on the alternate limb and reduce the tendon to the bone.  We repeated this step with a  other 3 sutures involved.  We had a robust repair.  We decided that based on the patient's bone quality being relatively poor that an internal brace would also be appropriate.  We passed a fiber tape suture at the bone tendon junction of the quadriceps and the patella bearing the suture in the tissues.  We then took it down to a swivel lock 4.75 peek anchor that was placed in the anteromedial tibia in standard fashion.  We obtained good purchase.  We did this with the knee at about 45 degrees of flexion ensuring not to over constrain.  Final construct was quite stable.  We irrigated the wound copiously before placing local antibiotic as listed above.  We closed the incision in a multilayer fashion with absorbable suture.  Sterile dressing was placed.  Knee immobilizer was placed.  Patient was awoken taken to PACU in stable condition.  Alfonse Alpers, PA-C, present and scrubbed throughout the case, critical for completion in a timely fashion, and for retraction, instrumentation, closure.
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
## 2020-10-27 NOTE — Progress Notes (Signed)
Received pt from ED accompanied by staff via stretcher, alert/oriented in no apparent distress,situated/orientated to room/equipments. Welcome guide/menu provided to pt with instructions. Pt verbalized understanding of instructions. Hospital valuables policy has been discussed with no complaints. Hospital bed in lowest position with 3 side rails up, callbell/room  phone within reach, and alll wheels locked.

## 2020-10-27 NOTE — Anesthesia Procedure Notes (Addendum)
Anesthesia Regional Block: Femoral nerve block   Pre-Anesthetic Checklist: , timeout performed,  Correct Patient, Correct Site, Correct Laterality,  Correct Procedure, Correct Position, site marked,  Risks and benefits discussed,  Surgical consent,  Pre-op evaluation,  At surgeon's request and post-op pain management  Laterality: Lower and Left  Prep: chloraprep       Needles:  Injection technique: Single-shot  Needle Type: Stimulator Needle - 80     Needle Length: 9cm  Needle Gauge: 22   Needle insertion depth: 6 cm   Additional Needles:   Procedures:,,,, ultrasound used (permanent image in chart),,    Narrative:  Start time: 10/27/2020 4:00 PM End time: 10/27/2020 4:18 PM Injection made incrementally with aspirations every 5 mL.  Performed by: Personally  Anesthesiologist: Lewie Loron, MD  Additional Notes: BP cuff, EKG monitors applied. Sedation begun. Femoral artery palpated for location of nerve. After nerve location verified with U/S, anesthetic injected incrementally, slowly, and after negative aspirations under direct u/s guidance. Good perineural spread. Patient tolerated well.

## 2020-10-27 NOTE — Progress Notes (Signed)
Orthopedic Tech Progress Note Patient Details:  Paige Klein [birth year] [id number]  Ortho Devices Type of Ortho Device: Knee Immobilizer Ortho Device/Splint Location: LLE Ortho Device/Splint Interventions: Ordered   Post Interventions Patient Tolerated: Well Instructions Provided: Adjustment of device, Care of device, Poper ambulation with device  [name] 10/27/2020, 12:01 AM

## 2020-10-27 NOTE — ED Notes (Signed)
Elsie Lincoln sister [phone] would like an update and to know when the patient is going to surgery

## 2020-10-27 NOTE — Interval H&P Note (Signed)
We reviewed imaging and may need to do a partial patellectomy vs orif, all questions answered

## 2020-10-27 NOTE — ED Notes (Addendum)
Report given to Jesusita Oka, RN of [phone]

## 2020-10-27 NOTE — Progress Notes (Signed)
Pt arrived to unit from PACU accompanied by staff nurse, pt alert/oriented in no apparent distress, pt has immobilizer to left leg with ace wrap. No bleeding noted at this time. No complaints.
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
## 2020-10-27 NOTE — Progress Notes (Signed)
OT Cancellation Note  Patient Details Name: LOREAL SCHUESSLER MRN: [id number] DOB: [birth year]   Cancelled Treatment:    Reason Eval/Treat Not Completed: Other (comment) Awaiting orthopedic consult for L patellar fx. Will assess once seen by ortho and WBS/ROM restrictions clarified.   Lagina Reader,HILLARY 10/27/2020, 9:54 AM Luisa Dago, OT/L   Acute OT Clinical Specialist Acute Rehabilitation Services Pager [phone] Office [phone]
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
## 2020-10-27 NOTE — Transfer of Care (Signed)
Immediate Anesthesia Transfer of Care Note  Patient: Paige Klein  Procedure(s) Performed: PATELLA TENDON REPAIR (Left: Knee) PATELLECTOMY PARTIAL (Left: Knee)  Patient Location: PACU  Anesthesia Type:General  Level of Consciousness: awake and alert   Airway & Oxygen Therapy: Patient Spontanous Breathing and Patient connected to face mask  Post-op Assessment: Report given to RN and Post -op Vital signs reviewed and stable  Post vital signs: Reviewed and stable  Last Vitals:  Vitals Value Taken Time  BP 142/62 10/27/20 1751  Temp    Pulse 83 10/27/20 1752  Resp 16 10/27/20 1752  SpO2 99 % 10/27/20 1752  Vitals shown include unvalidated device data.  Last Pain:  Vitals:   10/27/20 1529  TempSrc: Oral  PainSc: 8          Complications: No notable events documented.
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
## 2020-10-27 NOTE — ED Notes (Addendum)
Patient states that her BP and HR are normally low.

## 2020-10-27 NOTE — Anesthesia Procedure Notes (Addendum)
Procedure Name: Intubation Date/Time: 10/27/2020 4:39 PM Performed by: Josephine Igo, CRNA Pre-anesthesia Checklist: Patient identified, Emergency Drugs available, Suction available and Patient being monitored Patient Re-evaluated:Patient Re-evaluated prior to induction Oxygen Delivery Method: Circle system utilized Preoxygenation: Pre-oxygenation with 100% oxygen Induction Type: IV induction Ventilation: Mask ventilation without difficulty Laryngoscope Size: Mac Grade View: Grade I Tube type: Oral Tube size: 7.0 mm Number of attempts: 1 Airway Equipment and Method: Stylet Placement Confirmation: ETT inserted through vocal cords under direct vision, positive ETCO2 and breath sounds checked- equal and bilateral Secured at: 22 cm Tube secured with: Tape Dental Injury: Teeth and Oropharynx as per pre-operative assessment  Comments: Intubated by Everlene Other SRNA

## 2020-10-28 DIAGNOSIS — S2241XA Multiple fractures of ribs, right side, initial encounter for closed fracture: Secondary | ICD-10-CM | POA: Diagnosis not present

## 2020-10-28 DIAGNOSIS — I4891 Unspecified atrial fibrillation: Secondary | ICD-10-CM | POA: Diagnosis not present

## 2020-10-28 DIAGNOSIS — S82092A Other fracture of left patella, initial encounter for closed fracture: Secondary | ICD-10-CM | POA: Diagnosis not present

## 2020-10-28 LAB — CBC
HCT: 29.2 % — ABNORMAL LOW (ref 36.0–46.0)
Hemoglobin: 9.5 g/dL — ABNORMAL LOW (ref 12.0–15.0)
MCH: 30.9 pg (ref 26.0–34.0)
MCHC: 32.5 g/dL (ref 30.0–36.0)
MCV: 95.1 fL (ref 80.0–100.0)
Platelets: 184 10*3/uL (ref 150–400)
RBC: 3.07 MIL/uL — ABNORMAL LOW (ref 3.87–5.11)
RDW: 13.3 % (ref 11.5–15.5)
WBC: 8.2 10*3/uL (ref 4.0–10.5)
nRBC: 0 % (ref 0.0–0.2)

## 2020-10-28 MED ORDER — DILTIAZEM HCL 30 MG PO TABS: 30.0000 mg | ORAL_TABLET | ORAL | Status: DC | PRN

## 2020-10-28 MED ORDER — DILTIAZEM HCL 30 MG PO TABS: 30.0000 mg | ORAL_TABLET | Freq: Four times a day (QID) | ORAL | Status: DC | PRN

## 2020-10-28 MED ORDER — POLYETHYLENE GLYCOL 3350 17 G PO PACK
17.0000 g | PACK | Freq: Every day | ORAL | Status: DC
  Filled 2020-10-28: qty 1
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
## 2020-10-28 NOTE — Plan of Care (Signed)
  Problem: Education: Goal: Knowledge of General Education information will improve Description: Including pain rating scale, medication(s)/side effects and non-pharmacologic comfort measures Outcome: Progressing   Problem: Clinical Measurements: Goal: Will remain free from infection Outcome: Progressing   Problem: Activity: Goal: Risk for activity intolerance will decrease Outcome: Progressing   Problem: Nutrition: Goal: Adequate nutrition will be maintained Outcome: Progressing   Problem: Elimination: Goal: Will not experience complications related to bowel motility Outcome: Progressing   Problem: Pain Managment: Goal: General experience of comfort will improve Outcome: Progressing   Problem: Safety: Goal: Ability to remain free from injury will improve Outcome: Progressing

## 2020-10-28 NOTE — Anesthesia Postprocedure Evaluation (Signed)
Anesthesia Post Note  Patient: Paige Klein  Procedure(s) Performed: PATELLA TENDON REPAIR (Left: Knee) PATELLECTOMY PARTIAL (Left: Knee)     Patient location during evaluation: PACU Anesthesia Type: General Level of consciousness: sedated and patient cooperative Pain management: pain level controlled Vital Signs Assessment: post-procedure vital signs reviewed and stable Respiratory status: spontaneous breathing Cardiovascular status: stable Anesthetic complications: no   No notable events documented.  Last Vitals:  Vitals:   10/27/20 2145 10/28/20 0818  BP: (!) 150/75 (!) 111/45  Pulse:  63  Resp:  18  Temp: 37 C 36.6 C  SpO2: 98% 99%    Last Pain:  Vitals:   10/28/20 0818  TempSrc: Oral  PainSc:                  Lewie Loron

## 2020-10-28 NOTE — Addendum Note (Signed)
Addendum  created 10/28/20 1442 by Lewie Loron, MD   Clinical Note Signed, Intraprocedure Blocks edited

## 2020-10-28 NOTE — Care Management Obs Status (Signed)
MEDICARE OBSERVATION STATUS NOTIFICATION   Patient Details  Name: Paige Klein MRN: [id number] Date of Birth: 12-02-1950   Medicare Observation Status Notification Given:       Glennon Mac, RN 10/28/2020, 3:15 PM
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
## 2020-10-28 NOTE — Progress Notes (Signed)
Orthopedic Tech Progress Note Patient Details:  DEVIN FOSKEY 1951/02/24 [id number] Bledsoe knee brace has been ordered from [employer]  Patient ID: Neldon Mc, female   DOB: 09-10-1950, 70 y.o.   MRN: [id number]  Smitty Pluck 10/28/2020, 7:26 AM

## 2020-10-28 NOTE — Progress Notes (Signed)
   ORTHOPAEDIC PROGRESS NOTE  s/p Procedure(s): PATELLA TENDON REPAIR PATELLECTOMY PARTIAL  SUBJECTIVE: Reports mild pain about operative site. No chest pain. No SOB. No nausea/vomiting. No other complaints.  OBJECTIVE: PE: Left lower extremity: dressing CDI, knee immobilizer in place,  + GS/TA/EHL. Sensation intact in DP/SP/S/S/P distributions. 2+ DP pulse with warm and well perfused digits. Compartments soft and compressible, with no pain on passive stretch.   Vitals:   10/27/20 1839 10/27/20 2145  BP: (!) 162/67 (!) 150/75  Pulse: 66   Resp:    Temp: 97.8 F (36.6 C) 98.6 F (37 C)  SpO2: 100% 98%     ASSESSMENT: Paige Klein is a 70 y.o. female doing well postoperatively. POD#1  PLAN: Weightbearing: WBAT LLE Insicional and dressing care: Reinforce dressings as needed Orthopedic device(s):  Knee immobilizer. Transition to hinge brace locked in extension. Order was placed  for a Bledsoe Showering: Post-op #3 VTE prophylaxis: Lovenox for now.  Pain control: PRN pain medications, minimize narcotics as able Follow - up plan: 1 week in office Contact information:  Weekdays 8-5 Dr. Ramond Marrow, Alfonse Alpers PA-C, After hours and holidays please check [email or web address] for group call information for [facility]  PT/OT evals pending. Will need TOC team for discharge planning.   Alfonse Alpers, PA-C 10/28/2020

## 2020-10-29 ENCOUNTER — Other Ambulatory Visit (HOSPITAL_COMMUNITY): Payer: Self-pay

## 2020-10-29 ENCOUNTER — Encounter (HOSPITAL_COMMUNITY): Payer: Self-pay | Admitting: Orthopaedic Surgery

## 2020-10-29 DIAGNOSIS — S2241XA Multiple fractures of ribs, right side, initial encounter for closed fracture: Secondary | ICD-10-CM | POA: Diagnosis not present

## 2020-10-29 DIAGNOSIS — I4891 Unspecified atrial fibrillation: Secondary | ICD-10-CM | POA: Diagnosis not present

## 2020-10-29 DIAGNOSIS — S82092A Other fracture of left patella, initial encounter for closed fracture: Secondary | ICD-10-CM | POA: Diagnosis not present

## 2020-10-29 DIAGNOSIS — R531 Weakness: Secondary | ICD-10-CM | POA: Diagnosis not present

## 2020-10-29 LAB — CBC
HCT: 27.8 % — ABNORMAL LOW (ref 36.0–46.0)
Hemoglobin: 9 g/dL — ABNORMAL LOW (ref 12.0–15.0)
MCH: 31.1 pg (ref 26.0–34.0)
MCHC: 32.4 g/dL (ref 30.0–36.0)
MCV: 96.2 fL (ref 80.0–100.0)
Platelets: 176 10*3/uL (ref 150–400)
RBC: 2.89 MIL/uL — ABNORMAL LOW (ref 3.87–5.11)
RDW: 13.7 % (ref 11.5–15.5)
WBC: 6.3 10*3/uL (ref 4.0–10.5)
nRBC: 0 % (ref 0.0–0.2)

## 2020-10-29 LAB — BASIC METABOLIC PANEL
Anion gap: 3 — ABNORMAL LOW (ref 5–15)
BUN: 16 mg/dL (ref 8–23)
CO2: 22 mmol/L (ref 22–32)
Calcium: 7.5 mg/dL — ABNORMAL LOW (ref 8.9–10.3)
Chloride: 115 mmol/L — ABNORMAL HIGH (ref 98–111)
Creatinine, Ser: 0.82 mg/dL (ref 0.44–1.00)
GFR, Estimated: >60 mL/min (ref >60)
Glucose, Bld: 95 mg/dL (ref 70–99)
Potassium: 4.3 mmol/L (ref 3.5–5.1)
Sodium: 140 mmol/L (ref 135–145)

## 2020-10-29 MED ORDER — METHOCARBAMOL 500 MG PO TABS
500.0000 mg | ORAL_TABLET | Freq: Four times a day (QID) | ORAL | 0 refills | Status: DC | PRN
  Filled 2020-10-29: qty 30, 8d supply, fill #0

## 2020-10-29 MED ORDER — DOCUSATE SODIUM 100 MG PO CAPS: 100.0000 mg | ORAL_CAPSULE | Freq: Two times a day (BID) | ORAL | 0 refills | Status: DC | PRN

## 2020-10-29 MED ORDER — POLYETHYLENE GLYCOL 3350 17 G PO PACK: 17.0000 g | PACK | Freq: Two times a day (BID) | ORAL | 0 refills | Status: DC | PRN

## 2020-10-29 MED ORDER — ACETAMINOPHEN 500 MG PO TABS
1000.0000 mg | ORAL_TABLET | Freq: Three times a day (TID) | ORAL | 0 refills | Status: DC | PRN
Start: 2020-10-29 — End: 2024-01-09

## 2020-10-29 MED ORDER — OXYCODONE HCL 5 MG PO TABS
5.0000 mg | ORAL_TABLET | Freq: Four times a day (QID) | ORAL | 0 refills | Status: DC | PRN
  Filled 2020-10-29: qty 20, 5d supply, fill #0

## 2020-10-29 MED ORDER — APIXABAN 5 MG PO TABS
5.0000 mg | ORAL_TABLET | Freq: Two times a day (BID) | ORAL | Status: DC
  Administered 2020-10-29: 5 mg via ORAL
  Filled 2020-10-29: qty 1

## 2020-10-29 MED ORDER — ACETAMINOPHEN 500 MG PO TABS: 1000.0000 mg | ORAL_TABLET | Freq: Four times a day (QID) | ORAL | Status: DC | PRN

## 2020-10-29 MED ORDER — GABAPENTIN 300 MG PO CAPS
300.0000 mg | ORAL_CAPSULE | Freq: Three times a day (TID) | ORAL | 0 refills | Status: DC | PRN
  Filled 2020-10-29: qty 30, 10d supply, fill #0
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
## 2020-10-29 NOTE — Progress Notes (Signed)
   ORTHOPAEDIC PROGRESS NOTE  s/p Procedure(s): PATELLA TENDON REPAIR PATELLECTOMY PARTIAL  SUBJECTIVE: Reports moderate pain about operative site. Left hip is not bothering her today. No chest pain. No SOB. No nausea/vomiting. No other complaints.  OBJECTIVE: PE: Left lower extremity: dressing CDI, Bledsoe brace in place,  + GS/TA/EHL. Sensation intact in DP/SP/S/S/P distributions. 2+ DP pulse with warm and well perfused digits. Compartments soft and compressible, with no pain on passive stretch.   Vitals:   10/28/20 1428 10/28/20 2112  BP: (!) 130/52 (!) 130/47  Pulse: 68 64  Resp: 18 18  Temp: 98.3 F (36.8 C) 98.8 F (37.1 C)  SpO2: 100% 98%     ASSESSMENT: Paige Klein is a 70 y.o. female doing well postoperatively. POD#2  PLAN: Weightbearing: WBAT LLE Insicional and dressing care: Reinforce dressings as needed Orthopedic device(s): Bledsoe brace locked in extension Showering: Post-op #3 VTE prophylaxis: per trauma Pain control: PRN pain medications, minimize narcotics as able Follow - up plan: 1 week in office Contact information:  Weekdays 8-5 Dr. Ramond Marrow, Alfonse Alpers PA-C, After hours and holidays please check [email or web address] for group call information for [facility]  PT/OT recommending home health. TOC team following for discharge planning. Okay to discharge from orthopedic standpoint once cleared by trauma team and therapies. F/u in office next week.   Alfonse Alpers, PA-C 10/29/2020

## 2020-10-29 NOTE — TOC Transition Note (Signed)
Transition of Care [facility]) - CM/SW Discharge Note   Patient Details  Name: VELLA COLQUITT MRN: [id number] Date of Birth: [birth year]  Transition of Care [facility]) CM/SW Contact:  Glennon Mac, RN Phone Number: 10/29/2020, 10:10 AM   Clinical Narrative:    Pt medically stable for discharge home today.  She has a friend who will be assisting her with her care.  PT recommending HH follow up, and pt agreeable to services; referral to [facility] for continued therapy.  Referral to [facility] for RW, to be delivered to bedside prior to dc.      Final next level of care: Home w Home Health Services Barriers to Discharge: Barriers Resolved   Patient Goals and CMS Choice Patient states their goals for this hospitalization and ongoing recovery are:: to go home CMS Medicare.gov Compare Post Acute Care list provided to:: Patient Choice offered to / list presented to : Patient                      Discharge Plan and Services   Discharge Planning Services: CM Consult Post Acute Care Choice: Home Health          DME Arranged: Walker rolling DME Agency: [facility] Date DME Agency Contacted: 10/29/20 Time DME Agency Contacted: 1009 Representative spoke with at DME Agency: Velna Hatchet HH Arranged: PT HH Agency: [facility] Date [facility] Agency Contacted: 10/29/20 Time HH Agency Contacted: 1009 Representative spoke with at [facility] Agency: Lorenza Chick  Social Determinants of Health (SDOH) Interventions     Readmission Risk Interventions No flowsheet data found.  Quintella Baton, RN, BSN  Trauma/Neuro ICU Case Manager [phone]
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
## 2020-10-29 NOTE — Discharge Instructions (Addendum)
Ramond Marrow MD, MPH Alfonse Alpers, PA-C [facility] [address], [address] [phone] (tel)   [phone] (fax)   POST-OPERATIVE INSTRUCTIONS - KNEE  WOUND CARE You may remove the Operative Dressing on Post-Op Day #3 (72hrs after surgery).   Leave steri strips in place.   If you feel more comfortable with it you can leave all dressings in place till your 1 week follow-up appointment.   KEEP THE INCISIONS CLEAN AND DRY. An ACE wrap may be used to control swelling, do not wrap this too tight.  If the initial ACE wrap feels too tight or constricting you may loosen it. There may be a small amount of fluid/bleeding leaking at the surgical site.  This is normal; the knee is filled with fluid during the procedure and can leak for 24-48hrs after surgery.  You may change/reinforce the bandage as needed.  Use the Cryocuff, GameReady or Ice as often as possible for the first 3-4 days, then as needed for pain relief. Always keep a towel, ACE wrap or other barrier between the cooling unit and your skin.  You may shower on Post-Op Day #3. Gently pat the area dry. Do not soak the knee in water.  Do not go swimming in the pool or ocean until 4 weeks after surgery or when otherwise instructed.  BRACE/AMBULATION Your leg will be placed in a brace post-operatively.  You will need to wear your brace at all times until we discuss it further.  It should be locked in full extension (0 degrees) if adjustable.   You will be instructed on further bracing after your first visit. Use crutches for comfort but you can put your full weight on the leg as tolerated.  FOLLOW-UP Please call the office to schedule a follow-up appointment for your incision check if you do not already have one, 7-10 days post-operatively. IF YOU HAVE ANY QUESTIONS, PLEASE FEEL FREE TO CALL OUR OFFICE.  HELPFUL INFORMATION  If you had a block, it will wear off between 8-24 hrs postop typically.  This is  period when your pain may go from nearly zero to the pain you would have had post-op without the block.  This is an abrupt transition but nothing dangerous is happening.  You may take an extra dose of narcotic when this happens.  Keep your leg elevated to decrease swelling, which will then in turn decrease your pain. I would elevate the foot of your bed by putting a couple of couch pillows between your mattress and box spring. I would not keep pillow directly under your ankle.  You must wear the brace locked while sleeping and ambulating until follow-up.   There will be MORE swelling on days 1-3 than there is on the day of surgery.  This also is normal. The swelling will decrease with the anti-inflammatory medication, ice and keeping it elevated. The swelling will make it more difficult to bend your knee. As the swelling goes down your motion will become easier  You may develop swelling and bruising that extends from your knee down to your calf and perhaps even to your foot over the next week. Do not be alarmed. This too is normal, and it is due to gravity  There may be some numbness adjacent to the incision site. This may last for 6-12 months or longer in some patients and is expected.  You may return to sedentary work/school in the next couple of days when you feel up to it. You  will need to keep your leg elevated as much as possible   You should wean off your narcotic medicines as soon as you are able.  Most patients will be off or using minimal narcotics before their first postop appointment.   We suggest you use the pain medication the first night prior to going to bed, in order to ease any pain when the anesthesia wears off. You should avoid taking pain medications on an empty stomach as it will make you nauseous.  Do not drink alcoholic beverages or take illicit drugs when taking pain medications.  It is against the law to drive while taking narcotics. You cannot drive if your Right leg is  in brace locked in extension.  Pain medication may make you constipated.  Below are a few solutions to try in this order: Decrease the amount of pain medication if you aren't having pain. Drink lots of decaffeinated fluids. Drink prune juice and/or each dried prunes  If the first 3 don't work start with additional solutions Take Colace - an over-the-counter stool softener Take Senokot - an over-the-counter laxative Take Miralax - a stronger over-the-counter laxative  For more information including helpful videos and documents visit our website:   [email or web address]   RIB FRACTURES  HOME INSTRUCTIONS   PAIN CONTROL:  Pain is best controlled by a usual combination of three different methods TOGETHER:  Ice/Heat Over the counter pain medication Prescription pain medication You may experience some swelling and bruising in area of broken ribs. Ice packs or heating pads (30-60 minutes up to 6 times a day) will help. Use ice for the first few days to help decrease swelling and bruising, then switch to heat to help relax tight/sore spots and speed recovery. Some people prefer to use ice alone, heat alone, alternating between ice & heat. Experiment to what works for you. Swelling and bruising can take several weeks to resolve.  It is helpful to take an over-the-counter pain medication regularly for the first few weeks. Choose one of the following that works best for you:  Naproxen (Aleve, etc) Two 220mg  tabs twice a day Ibuprofen (Advil, etc) Three 200mg  tabs four times a day (every meal & bedtime) Acetaminophen (Tylenol, etc) 500-650mg  four times a day (every meal & bedtime) A prescription for pain medication (such as oxycodone, hydrocodone, etc) may be given to you upon discharge. Take your pain medication as prescribed.  If you are having problems/concerns with the prescription medicine (does not control pain, nausea, vomiting, rash, itching, etc), please  call [phone] to see if we need to switch you to a different pain medicine that will work better for you and/or control your side effect better. If you need a refill on your pain medication, please contact your pharmacy. They will contact our office to request authorization. Prescriptions will not be filled after 5 pm or on week-ends. Avoid getting constipated. When taking pain medications, it is common to experience some constipation. Increasing fluid intake and taking a fiber supplement (such as Metamucil, Citrucel, FiberCon, MiraLax, etc) 1-2 times a day regularly will usually help prevent this problem from occurring. A mild laxative (prune juice, Milk of Magnesia, MiraLax, etc) should be taken according to package directions if there are no bowel movements after 48 hours.  Watch out for diarrhea. If you have many loose bowel movements, simplify your diet to bland foods & liquids for a few days. Stop any stool softeners and decrease your fiber supplement. Switching to mild anti-diarrheal medications (  Kayopectate, Pepto Bismol) can help. If this worsens or does not improve, please call us. FOLLOW UP  If a follow up appointment is needed one will be scheduled for you. If none is needed with our trauma team, please follow up with your primary care provider within 2-3 weeks from discharge. Please call [facility] at [phone] if you have any questions about follow up.  If you have any orthopedic or other injuries you will need to follow up as outlined in your follow up instructions.   WHEN TO CALL US [phone]:  Poor pain control Reactions / problems with new medications (rash/itching, nausea, etc)  Fever over 101.5 F (38.5 C) Worsening swelling or bruising Worsening pain, productive cough, difficulty breathing or any other concerning symptoms  The clinic staff is available to answer your questions during regular business hours (8:30am-5pm). Please don't hesitate to call and ask to speak to  one of our nurses for clinical concerns.  If you have a medical emergency, go to the nearest emergency room or call 911.  A surgeon from [facility] is always on call at the [facility]   [facility] [place]  [address], [address], [place], [place] [address] ?  MAIN: [phone] ? TOLL FREE: [phone] ?  FAX [phone]  [email or web address]      Information on Rib Fractures  A rib fracture is a break or crack in one of the bones of the ribs. The ribs are long, curved bones that wrap around your chest and attach to your spine and your breastbone. The ribs protect your heart, lungs, and other organs in the chest. A broken or cracked rib is often painful but is not usually serious. Most rib fractures heal on their own over time. However, rib fractures can be more serious if multiple ribs are broken or if broken ribs move out of place and push against other structures or organs. What are the causes? This condition is caused by: Repetitive movements with high force, such as pitching a baseball or having severe coughing spells. A direct blow to the chest, such as a sports injury, a car accident, or a fall. Cancer that has spread to the bones, which can weaken bones and cause them to break. What are the signs or symptoms? Symptoms of this condition include: Pain when you breathe in or cough. Pain when someone presses on the injured area. Feeling short of breath. How is this diagnosed? This condition is diagnosed with a physical exam and medical history. Imaging tests may also be done, such as: Chest X-ray. CT scan. MRI. Bone scan. Chest ultrasound. How is this treated? Treatment for this condition depends on the severity of the fracture. Most rib fractures usually heal on their own in 1-3 months. Sometimes healing takes longer if there is a cough that does not stop or if there are other activities that make the injury worse (aggravating  factors). While you heal, you will be given medicines to control the pain. You will also be taught deep breathing exercises. Severe injuries may require hospitalization or surgery. Follow these instructions at home: Managing pain, stiffness, and swelling If directed, apply ice to the injured area. Put ice in a plastic bag. Place a towel between your skin and the bag. Leave the ice on for 20 minutes, 2-3 times a day. Take over-the-counter and prescription medicines only as told by your health care provider. Activity Avoid a lot of activity and any activities or movements that  cause pain. Be careful during activities and avoid bumping the injured rib. Slowly increase your activity as told by your health care provider. General instructions Do deep breathing exercises as told by your health care provider. This helps prevent pneumonia, which is a common complication of a broken rib. Your health care provider may instruct you to: Take deep breaths several times a day. Try to cough several times a day, holding a pillow against the injured area. Use a device called incentive spirometer to practice deep breathing several times a day. Drink enough fluid to keep your urine pale yellow. Do not wear a rib belt or binder. These restrict breathing, which can lead to pneumonia. Keep all follow-up visits as told by your health care provider. This is important. Contact a health care provider if: You have a fever. Get help right away if: You have difficulty breathing or you are short of breath. You develop a cough that does not stop, or you cough up thick or bloody sputum. You have nausea, vomiting, or pain in your abdomen. Your pain gets worse and medicine does not help. Summary A rib fracture is a break or crack in one of the bones of the ribs. A broken or cracked rib is often painful but is not usually serious. Most rib fractures heal on their own over time. Treatment for this condition depends on the  severity of the fracture. Avoid a lot of activity and any activities or movements that cause pain. This information is not intended to replace advice given to you by your health care provider. Make sure you discuss any questions you have with your health care provider. Document Released: 03/06/2005 Document Revised: 06/05/2016 Document Reviewed: 06/05/2016 Elsevier Interactive Patient Education  2019 [employer].

## 2020-11-01 ENCOUNTER — Telehealth: Payer: Self-pay

## 2020-11-01 NOTE — Telephone Encounter (Signed)
Transition Care Management Follow-up Telephone Call Date of discharge and from where: [facility] How have you been since you were released from the hospital? No Any questions or concerns? No  Items Reviewed: Did the pt receive and understand the discharge instructions provided? Yes  Medications obtained and verified? Yes  Other? No  Any new allergies since your discharge? No  Dietary orders reviewed? No Do you have support at home? Yes   Home Care and Equipment/Supplies: Were home health services ordered? yes If so, what is the name of the agency? [facility]  Has the agency set up a time to come to the patient's home? no Were any new equipment or medical supplies ordered?  No  Were you able to get the supplies/equipment? not applicable Do you have any questions related to the use of the equipment or supplies? No  Functional Questionnaire: (I = Independent and D = Dependent) ADLs: I  Bathing/Dressing- I  Meal Prep- I  Eating- I  Maintaining continence- I  Transferring/Ambulation- I  Managing Meds- I  Follow up appointments reviewed:  PCP Hospital f/u appt confirmed? Yes  Scheduled to see Dr. Lynelle Doctor on 11/04/20 @ 2:15. Specialist Hospital f/u appt confirmed? No   Are transportation arrangements needed? Yes  pt. Has a friend that is going to bring her to her apts.  If their condition worsens, is the pt aware to call PCP or go to the Emergency Dept.? Yes Was the patient provided with contact information for the PCP's office or ED? Yes Was to pt encouraged to call back with questions or concerns? Yes

## 2020-11-02 ENCOUNTER — Telehealth (INDEPENDENT_AMBULATORY_CARE_PROVIDER_SITE_OTHER): Payer: Medicare PPO | Admitting: Cardiology

## 2020-11-02 ENCOUNTER — Encounter: Payer: Self-pay | Admitting: Cardiology

## 2020-11-02 ENCOUNTER — Other Ambulatory Visit: Payer: Self-pay

## 2020-11-02 DIAGNOSIS — I480 Paroxysmal atrial fibrillation: Secondary | ICD-10-CM | POA: Diagnosis not present
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
## 2020-11-03 DIAGNOSIS — D62 Acute posthemorrhagic anemia: Secondary | ICD-10-CM | POA: Diagnosis not present

## 2020-11-03 DIAGNOSIS — K648 Other hemorrhoids: Secondary | ICD-10-CM | POA: Diagnosis not present

## 2020-11-03 DIAGNOSIS — I4891 Unspecified atrial fibrillation: Secondary | ICD-10-CM | POA: Diagnosis not present

## 2020-11-03 DIAGNOSIS — I341 Nonrheumatic mitral (valve) prolapse: Secondary | ICD-10-CM | POA: Diagnosis not present

## 2020-11-03 DIAGNOSIS — S82042D Displaced comminuted fracture of left patella, subsequent encounter for closed fracture with routine healing: Secondary | ICD-10-CM | POA: Diagnosis not present

## 2020-11-03 DIAGNOSIS — S2241XD Multiple fractures of ribs, right side, subsequent encounter for fracture with routine healing: Secondary | ICD-10-CM | POA: Diagnosis not present

## 2020-11-03 DIAGNOSIS — I2510 Atherosclerotic heart disease of native coronary artery without angina pectoris: Secondary | ICD-10-CM | POA: Diagnosis not present

## 2020-11-03 DIAGNOSIS — M810 Age-related osteoporosis without current pathological fracture: Secondary | ICD-10-CM | POA: Diagnosis not present

## 2020-11-03 DIAGNOSIS — M25561 Pain in right knee: Secondary | ICD-10-CM | POA: Diagnosis not present
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
## 2020-11-04 ENCOUNTER — Ambulatory Visit: Payer: Medicare PPO | Admitting: Family Medicine

## 2020-11-04 ENCOUNTER — Encounter: Payer: Self-pay | Admitting: Family Medicine

## 2020-11-04 ENCOUNTER — Inpatient Hospital Stay: Payer: Medicare PPO | Admitting: Family Medicine

## 2020-11-04 ENCOUNTER — Other Ambulatory Visit: Payer: Self-pay

## 2020-11-04 VITALS — BP 124/78 | HR 61 | Temp 98.6°F | Wt 144.4 lb

## 2020-11-04 DIAGNOSIS — D649 Anemia, unspecified: Secondary | ICD-10-CM

## 2020-11-04 DIAGNOSIS — Z7901 Long term (current) use of anticoagulants: Secondary | ICD-10-CM | POA: Diagnosis not present

## 2020-11-04 DIAGNOSIS — S2241XA Multiple fractures of ribs, right side, initial encounter for closed fracture: Secondary | ICD-10-CM

## 2020-11-04 DIAGNOSIS — S82045A Nondisplaced comminuted fracture of left patella, initial encounter for closed fracture: Secondary | ICD-10-CM | POA: Diagnosis not present

## 2020-11-04 NOTE — Patient Instructions (Addendum)
Use the robaxin if needed for muscle spasm. Use the gabapentin for pain. Cut back on the oxycodone (as you have), using it only sparingly for very severe pain. This will cause the constipation. You may use extra strength tylenol if needed for pain.  Use antibacterial ointment to any skin abrasions (specifically the R lower leg).  If you notice increasing redness, swelling, drainage or pain, this could indicate an infection and need to be re-evaluated.  Try and keep the leg elevated when you're not up and moving around.

## 2020-11-05 DIAGNOSIS — I4891 Unspecified atrial fibrillation: Secondary | ICD-10-CM | POA: Diagnosis not present

## 2020-11-05 DIAGNOSIS — S82002D Unspecified fracture of left patella, subsequent encounter for closed fracture with routine healing: Secondary | ICD-10-CM | POA: Diagnosis not present

## 2020-11-05 DIAGNOSIS — K648 Other hemorrhoids: Secondary | ICD-10-CM | POA: Diagnosis not present

## 2020-11-05 DIAGNOSIS — M25561 Pain in right knee: Secondary | ICD-10-CM | POA: Diagnosis not present

## 2020-11-05 DIAGNOSIS — I341 Nonrheumatic mitral (valve) prolapse: Secondary | ICD-10-CM | POA: Diagnosis not present

## 2020-11-05 DIAGNOSIS — I2510 Atherosclerotic heart disease of native coronary artery without angina pectoris: Secondary | ICD-10-CM | POA: Diagnosis not present

## 2020-11-05 DIAGNOSIS — S82042D Displaced comminuted fracture of left patella, subsequent encounter for closed fracture with routine healing: Secondary | ICD-10-CM | POA: Diagnosis not present

## 2020-11-05 DIAGNOSIS — S2241XD Multiple fractures of ribs, right side, subsequent encounter for fracture with routine healing: Secondary | ICD-10-CM | POA: Diagnosis not present

## 2020-11-05 DIAGNOSIS — M810 Age-related osteoporosis without current pathological fracture: Secondary | ICD-10-CM | POA: Diagnosis not present

## 2020-11-05 DIAGNOSIS — D62 Acute posthemorrhagic anemia: Secondary | ICD-10-CM | POA: Diagnosis not present

## 2020-11-05 LAB — CBC WITH DIFFERENTIAL/PLATELET
Basophils Absolute: 0.1 10*3/uL (ref 0.0–0.2)
Basos: 1 %
EOS (ABSOLUTE): 0.1 10*3/uL (ref 0.0–0.4)
Eos: 1 %
Hematocrit: 31.3 % — ABNORMAL LOW (ref 34.0–46.6)
Hemoglobin: 10 g/dL — ABNORMAL LOW (ref 11.1–15.9)
Immature Grans (Abs): 0 10*3/uL (ref 0.0–0.1)
Immature Granulocytes: 0 %
Lymphocytes Absolute: 1 10*3/uL (ref 0.7–3.1)
Lymphs: 11 %
MCH: 30.3 pg (ref 26.6–33.0)
MCHC: 31.9 g/dL (ref 31.5–35.7)
MCV: 95 fL (ref 79–97)
Monocytes Absolute: 0.7 10*3/uL (ref 0.1–0.9)
Monocytes: 8 %
Neutrophils Absolute: 6.9 10*3/uL (ref 1.4–7.0)
Neutrophils: 79 %
Platelets: 374 10*3/uL (ref 150–450)
RBC: 3.3 x10E6/uL — ABNORMAL LOW (ref 3.77–5.28)
RDW: 12.6 % (ref 11.7–15.4)
WBC: 8.7 10*3/uL (ref 3.4–10.8)

## 2020-11-05 LAB — FERRITIN: Ferritin: 247 ng/mL — ABNORMAL HIGH (ref 15–150)

## 2020-11-09 DIAGNOSIS — M810 Age-related osteoporosis without current pathological fracture: Secondary | ICD-10-CM | POA: Diagnosis not present

## 2020-11-09 DIAGNOSIS — I2510 Atherosclerotic heart disease of native coronary artery without angina pectoris: Secondary | ICD-10-CM | POA: Diagnosis not present

## 2020-11-09 DIAGNOSIS — I341 Nonrheumatic mitral (valve) prolapse: Secondary | ICD-10-CM | POA: Diagnosis not present

## 2020-11-09 DIAGNOSIS — S2241XD Multiple fractures of ribs, right side, subsequent encounter for fracture with routine healing: Secondary | ICD-10-CM | POA: Diagnosis not present

## 2020-11-09 DIAGNOSIS — D62 Acute posthemorrhagic anemia: Secondary | ICD-10-CM | POA: Diagnosis not present

## 2020-11-09 DIAGNOSIS — S82042D Displaced comminuted fracture of left patella, subsequent encounter for closed fracture with routine healing: Secondary | ICD-10-CM | POA: Diagnosis not present

## 2020-11-09 DIAGNOSIS — K648 Other hemorrhoids: Secondary | ICD-10-CM | POA: Diagnosis not present

## 2020-11-09 DIAGNOSIS — I4891 Unspecified atrial fibrillation: Secondary | ICD-10-CM | POA: Diagnosis not present

## 2020-11-09 DIAGNOSIS — M25561 Pain in right knee: Secondary | ICD-10-CM | POA: Diagnosis not present

## 2020-11-11 DIAGNOSIS — S82042D Displaced comminuted fracture of left patella, subsequent encounter for closed fracture with routine healing: Secondary | ICD-10-CM | POA: Diagnosis not present

## 2020-11-11 DIAGNOSIS — S2241XD Multiple fractures of ribs, right side, subsequent encounter for fracture with routine healing: Secondary | ICD-10-CM | POA: Diagnosis not present

## 2020-11-11 DIAGNOSIS — M25561 Pain in right knee: Secondary | ICD-10-CM | POA: Diagnosis not present

## 2020-11-11 DIAGNOSIS — I2510 Atherosclerotic heart disease of native coronary artery without angina pectoris: Secondary | ICD-10-CM | POA: Diagnosis not present

## 2020-11-11 DIAGNOSIS — M810 Age-related osteoporosis without current pathological fracture: Secondary | ICD-10-CM | POA: Diagnosis not present

## 2020-11-11 DIAGNOSIS — D62 Acute posthemorrhagic anemia: Secondary | ICD-10-CM | POA: Diagnosis not present

## 2020-11-11 DIAGNOSIS — K648 Other hemorrhoids: Secondary | ICD-10-CM | POA: Diagnosis not present

## 2020-11-11 DIAGNOSIS — I341 Nonrheumatic mitral (valve) prolapse: Secondary | ICD-10-CM | POA: Diagnosis not present

## 2020-11-11 DIAGNOSIS — I4891 Unspecified atrial fibrillation: Secondary | ICD-10-CM | POA: Diagnosis not present

## 2020-11-15 DIAGNOSIS — M25561 Pain in right knee: Secondary | ICD-10-CM | POA: Diagnosis not present

## 2020-11-15 DIAGNOSIS — S2241XD Multiple fractures of ribs, right side, subsequent encounter for fracture with routine healing: Secondary | ICD-10-CM | POA: Diagnosis not present

## 2020-11-15 DIAGNOSIS — S82042D Displaced comminuted fracture of left patella, subsequent encounter for closed fracture with routine healing: Secondary | ICD-10-CM | POA: Diagnosis not present

## 2020-11-15 DIAGNOSIS — K648 Other hemorrhoids: Secondary | ICD-10-CM | POA: Diagnosis not present

## 2020-11-15 DIAGNOSIS — D62 Acute posthemorrhagic anemia: Secondary | ICD-10-CM | POA: Diagnosis not present

## 2020-11-15 DIAGNOSIS — I2510 Atherosclerotic heart disease of native coronary artery without angina pectoris: Secondary | ICD-10-CM | POA: Diagnosis not present

## 2020-11-15 DIAGNOSIS — I341 Nonrheumatic mitral (valve) prolapse: Secondary | ICD-10-CM | POA: Diagnosis not present

## 2020-11-15 DIAGNOSIS — I4891 Unspecified atrial fibrillation: Secondary | ICD-10-CM | POA: Diagnosis not present

## 2020-11-15 DIAGNOSIS — M810 Age-related osteoporosis without current pathological fracture: Secondary | ICD-10-CM | POA: Diagnosis not present

## 2020-11-18 DIAGNOSIS — I2510 Atherosclerotic heart disease of native coronary artery without angina pectoris: Secondary | ICD-10-CM | POA: Diagnosis not present

## 2020-11-18 DIAGNOSIS — I341 Nonrheumatic mitral (valve) prolapse: Secondary | ICD-10-CM | POA: Diagnosis not present

## 2020-11-18 DIAGNOSIS — M810 Age-related osteoporosis without current pathological fracture: Secondary | ICD-10-CM | POA: Diagnosis not present

## 2020-11-18 DIAGNOSIS — S2241XD Multiple fractures of ribs, right side, subsequent encounter for fracture with routine healing: Secondary | ICD-10-CM | POA: Diagnosis not present

## 2020-11-18 DIAGNOSIS — S82042D Displaced comminuted fracture of left patella, subsequent encounter for closed fracture with routine healing: Secondary | ICD-10-CM | POA: Diagnosis not present

## 2020-11-18 DIAGNOSIS — D62 Acute posthemorrhagic anemia: Secondary | ICD-10-CM | POA: Diagnosis not present

## 2020-11-18 DIAGNOSIS — K648 Other hemorrhoids: Secondary | ICD-10-CM | POA: Diagnosis not present

## 2020-11-18 DIAGNOSIS — I4891 Unspecified atrial fibrillation: Secondary | ICD-10-CM | POA: Diagnosis not present

## 2020-11-18 DIAGNOSIS — M25561 Pain in right knee: Secondary | ICD-10-CM | POA: Diagnosis not present

## 2020-11-23 DIAGNOSIS — S82042D Displaced comminuted fracture of left patella, subsequent encounter for closed fracture with routine healing: Secondary | ICD-10-CM | POA: Diagnosis not present

## 2020-11-23 DIAGNOSIS — I341 Nonrheumatic mitral (valve) prolapse: Secondary | ICD-10-CM | POA: Diagnosis not present

## 2020-11-23 DIAGNOSIS — M25561 Pain in right knee: Secondary | ICD-10-CM | POA: Diagnosis not present

## 2020-11-23 DIAGNOSIS — I2510 Atherosclerotic heart disease of native coronary artery without angina pectoris: Secondary | ICD-10-CM | POA: Diagnosis not present

## 2020-11-23 DIAGNOSIS — M810 Age-related osteoporosis without current pathological fracture: Secondary | ICD-10-CM | POA: Diagnosis not present

## 2020-11-23 DIAGNOSIS — K648 Other hemorrhoids: Secondary | ICD-10-CM | POA: Diagnosis not present

## 2020-11-23 DIAGNOSIS — I4891 Unspecified atrial fibrillation: Secondary | ICD-10-CM | POA: Diagnosis not present

## 2020-11-23 DIAGNOSIS — D62 Acute posthemorrhagic anemia: Secondary | ICD-10-CM | POA: Diagnosis not present

## 2020-11-23 DIAGNOSIS — S2241XD Multiple fractures of ribs, right side, subsequent encounter for fracture with routine healing: Secondary | ICD-10-CM | POA: Diagnosis not present

## 2020-11-24 DIAGNOSIS — Z79899 Other long term (current) drug therapy: Secondary | ICD-10-CM | POA: Diagnosis not present

## 2020-11-24 DIAGNOSIS — Z803 Family history of malignant neoplasm of breast: Secondary | ICD-10-CM | POA: Diagnosis not present

## 2020-11-24 DIAGNOSIS — M2550 Pain in unspecified joint: Secondary | ICD-10-CM | POA: Diagnosis not present

## 2020-11-24 DIAGNOSIS — I4891 Unspecified atrial fibrillation: Secondary | ICD-10-CM | POA: Diagnosis not present

## 2020-11-24 DIAGNOSIS — Z885 Allergy status to narcotic agent status: Secondary | ICD-10-CM | POA: Diagnosis not present

## 2020-11-24 DIAGNOSIS — M810 Age-related osteoporosis without current pathological fracture: Secondary | ICD-10-CM | POA: Diagnosis not present

## 2020-11-24 DIAGNOSIS — H409 Unspecified glaucoma: Secondary | ICD-10-CM | POA: Diagnosis not present

## 2020-11-24 DIAGNOSIS — Z7901 Long term (current) use of anticoagulants: Secondary | ICD-10-CM | POA: Diagnosis not present

## 2020-11-24 DIAGNOSIS — Z8249 Family history of ischemic heart disease and other diseases of the circulatory system: Secondary | ICD-10-CM | POA: Diagnosis not present

## 2020-12-01 DIAGNOSIS — K648 Other hemorrhoids: Secondary | ICD-10-CM | POA: Diagnosis not present

## 2020-12-01 DIAGNOSIS — M810 Age-related osteoporosis without current pathological fracture: Secondary | ICD-10-CM | POA: Diagnosis not present

## 2020-12-01 DIAGNOSIS — I341 Nonrheumatic mitral (valve) prolapse: Secondary | ICD-10-CM | POA: Diagnosis not present

## 2020-12-01 DIAGNOSIS — D62 Acute posthemorrhagic anemia: Secondary | ICD-10-CM | POA: Diagnosis not present

## 2020-12-01 DIAGNOSIS — M25561 Pain in right knee: Secondary | ICD-10-CM | POA: Diagnosis not present

## 2020-12-01 DIAGNOSIS — I4891 Unspecified atrial fibrillation: Secondary | ICD-10-CM | POA: Diagnosis not present

## 2020-12-01 DIAGNOSIS — I2510 Atherosclerotic heart disease of native coronary artery without angina pectoris: Secondary | ICD-10-CM | POA: Diagnosis not present

## 2020-12-01 DIAGNOSIS — S2241XD Multiple fractures of ribs, right side, subsequent encounter for fracture with routine healing: Secondary | ICD-10-CM | POA: Diagnosis not present

## 2020-12-01 DIAGNOSIS — S82042D Displaced comminuted fracture of left patella, subsequent encounter for closed fracture with routine healing: Secondary | ICD-10-CM | POA: Diagnosis not present

## 2020-12-07 DIAGNOSIS — I341 Nonrheumatic mitral (valve) prolapse: Secondary | ICD-10-CM | POA: Diagnosis not present

## 2020-12-07 DIAGNOSIS — K648 Other hemorrhoids: Secondary | ICD-10-CM | POA: Diagnosis not present

## 2020-12-07 DIAGNOSIS — I4891 Unspecified atrial fibrillation: Secondary | ICD-10-CM | POA: Diagnosis not present

## 2020-12-07 DIAGNOSIS — S2241XD Multiple fractures of ribs, right side, subsequent encounter for fracture with routine healing: Secondary | ICD-10-CM | POA: Diagnosis not present

## 2020-12-07 DIAGNOSIS — I2510 Atherosclerotic heart disease of native coronary artery without angina pectoris: Secondary | ICD-10-CM | POA: Diagnosis not present

## 2020-12-07 DIAGNOSIS — S82042D Displaced comminuted fracture of left patella, subsequent encounter for closed fracture with routine healing: Secondary | ICD-10-CM | POA: Diagnosis not present

## 2020-12-07 DIAGNOSIS — D62 Acute posthemorrhagic anemia: Secondary | ICD-10-CM | POA: Diagnosis not present

## 2020-12-07 DIAGNOSIS — M810 Age-related osteoporosis without current pathological fracture: Secondary | ICD-10-CM | POA: Diagnosis not present

## 2020-12-07 DIAGNOSIS — M25561 Pain in right knee: Secondary | ICD-10-CM | POA: Diagnosis not present

## 2020-12-08 DIAGNOSIS — M25662 Stiffness of left knee, not elsewhere classified: Secondary | ICD-10-CM | POA: Diagnosis not present

## 2020-12-08 DIAGNOSIS — S82092D Other fracture of left patella, subsequent encounter for closed fracture with routine healing: Secondary | ICD-10-CM | POA: Diagnosis not present

## 2020-12-08 DIAGNOSIS — M6281 Muscle weakness (generalized): Secondary | ICD-10-CM | POA: Diagnosis not present

## 2020-12-15 DIAGNOSIS — M6281 Muscle weakness (generalized): Secondary | ICD-10-CM | POA: Diagnosis not present

## 2020-12-15 DIAGNOSIS — S82092D Other fracture of left patella, subsequent encounter for closed fracture with routine healing: Secondary | ICD-10-CM | POA: Diagnosis not present

## 2020-12-15 DIAGNOSIS — M25662 Stiffness of left knee, not elsewhere classified: Secondary | ICD-10-CM | POA: Diagnosis not present

## 2020-12-21 DIAGNOSIS — M25662 Stiffness of left knee, not elsewhere classified: Secondary | ICD-10-CM | POA: Diagnosis not present

## 2020-12-21 DIAGNOSIS — M6281 Muscle weakness (generalized): Secondary | ICD-10-CM | POA: Diagnosis not present

## 2020-12-21 DIAGNOSIS — S82092D Other fracture of left patella, subsequent encounter for closed fracture with routine healing: Secondary | ICD-10-CM | POA: Diagnosis not present

## 2020-12-23 DIAGNOSIS — M6281 Muscle weakness (generalized): Secondary | ICD-10-CM | POA: Diagnosis not present

## 2020-12-23 DIAGNOSIS — M25662 Stiffness of left knee, not elsewhere classified: Secondary | ICD-10-CM | POA: Diagnosis not present

## 2020-12-23 DIAGNOSIS — S82092D Other fracture of left patella, subsequent encounter for closed fracture with routine healing: Secondary | ICD-10-CM | POA: Diagnosis not present

## 2020-12-28 DIAGNOSIS — S82092D Other fracture of left patella, subsequent encounter for closed fracture with routine healing: Secondary | ICD-10-CM | POA: Diagnosis not present

## 2020-12-28 DIAGNOSIS — M6281 Muscle weakness (generalized): Secondary | ICD-10-CM | POA: Diagnosis not present

## 2020-12-28 DIAGNOSIS — M25662 Stiffness of left knee, not elsewhere classified: Secondary | ICD-10-CM | POA: Diagnosis not present

## 2020-12-30 DIAGNOSIS — M6281 Muscle weakness (generalized): Secondary | ICD-10-CM | POA: Diagnosis not present

## 2020-12-30 DIAGNOSIS — S82092D Other fracture of left patella, subsequent encounter for closed fracture with routine healing: Secondary | ICD-10-CM | POA: Diagnosis not present

## 2020-12-30 DIAGNOSIS — M25662 Stiffness of left knee, not elsewhere classified: Secondary | ICD-10-CM | POA: Diagnosis not present

## 2021-01-04 DIAGNOSIS — M6281 Muscle weakness (generalized): Secondary | ICD-10-CM | POA: Diagnosis not present

## 2021-01-04 DIAGNOSIS — M25662 Stiffness of left knee, not elsewhere classified: Secondary | ICD-10-CM | POA: Diagnosis not present

## 2021-01-04 DIAGNOSIS — S82092D Other fracture of left patella, subsequent encounter for closed fracture with routine healing: Secondary | ICD-10-CM | POA: Diagnosis not present

## 2021-01-06 ENCOUNTER — Telehealth: Payer: Self-pay | Admitting: Family Medicine

## 2021-01-06 DIAGNOSIS — M25662 Stiffness of left knee, not elsewhere classified: Secondary | ICD-10-CM | POA: Diagnosis not present

## 2021-01-06 DIAGNOSIS — M6281 Muscle weakness (generalized): Secondary | ICD-10-CM | POA: Diagnosis not present

## 2021-01-06 DIAGNOSIS — S82092D Other fracture of left patella, subsequent encounter for closed fracture with routine healing: Secondary | ICD-10-CM | POA: Diagnosis not present

## 2021-01-06 NOTE — Telephone Encounter (Signed)
Left message for pt to call back. I need to speak to her concerning her next Prolia injection.

## 2021-01-11 DIAGNOSIS — S82092D Other fracture of left patella, subsequent encounter for closed fracture with routine healing: Secondary | ICD-10-CM | POA: Diagnosis not present

## 2021-01-11 DIAGNOSIS — M25662 Stiffness of left knee, not elsewhere classified: Secondary | ICD-10-CM | POA: Diagnosis not present

## 2021-01-11 DIAGNOSIS — M6281 Muscle weakness (generalized): Secondary | ICD-10-CM | POA: Diagnosis not present

## 2021-01-17 ENCOUNTER — Other Ambulatory Visit: Payer: Self-pay

## 2021-01-17 ENCOUNTER — Other Ambulatory Visit (INDEPENDENT_AMBULATORY_CARE_PROVIDER_SITE_OTHER): Payer: Medicare PPO

## 2021-01-17 DIAGNOSIS — M810 Age-related osteoporosis without current pathological fracture: Secondary | ICD-10-CM

## 2021-01-17 MED ORDER — DENOSUMAB 60 MG/ML ~~LOC~~ SOSY
60.0000 mg | PREFILLED_SYRINGE | Freq: Once | SUBCUTANEOUS | Status: AC
  Administered 2021-01-17: 60 mg via SUBCUTANEOUS

## 2021-01-18 DIAGNOSIS — M25662 Stiffness of left knee, not elsewhere classified: Secondary | ICD-10-CM | POA: Diagnosis not present

## 2021-01-18 DIAGNOSIS — S82092D Other fracture of left patella, subsequent encounter for closed fracture with routine healing: Secondary | ICD-10-CM | POA: Diagnosis not present

## 2021-01-18 DIAGNOSIS — M6281 Muscle weakness (generalized): Secondary | ICD-10-CM | POA: Diagnosis not present

## 2021-01-20 DIAGNOSIS — M25662 Stiffness of left knee, not elsewhere classified: Secondary | ICD-10-CM | POA: Diagnosis not present

## 2021-01-20 DIAGNOSIS — M6281 Muscle weakness (generalized): Secondary | ICD-10-CM | POA: Diagnosis not present

## 2021-01-20 DIAGNOSIS — S82092D Other fracture of left patella, subsequent encounter for closed fracture with routine healing: Secondary | ICD-10-CM | POA: Diagnosis not present

## 2021-01-21 DIAGNOSIS — H401133 Primary open-angle glaucoma, bilateral, severe stage: Secondary | ICD-10-CM | POA: Diagnosis not present

## 2021-01-24 DIAGNOSIS — S82092D Other fracture of left patella, subsequent encounter for closed fracture with routine healing: Secondary | ICD-10-CM | POA: Diagnosis not present

## 2021-01-24 DIAGNOSIS — M25662 Stiffness of left knee, not elsewhere classified: Secondary | ICD-10-CM | POA: Diagnosis not present

## 2021-01-24 DIAGNOSIS — M6281 Muscle weakness (generalized): Secondary | ICD-10-CM | POA: Diagnosis not present

## 2021-01-27 DIAGNOSIS — M6281 Muscle weakness (generalized): Secondary | ICD-10-CM | POA: Diagnosis not present

## 2021-01-27 DIAGNOSIS — S82092D Other fracture of left patella, subsequent encounter for closed fracture with routine healing: Secondary | ICD-10-CM | POA: Diagnosis not present

## 2021-01-27 DIAGNOSIS — M25662 Stiffness of left knee, not elsewhere classified: Secondary | ICD-10-CM | POA: Diagnosis not present

## 2021-02-01 DIAGNOSIS — M25662 Stiffness of left knee, not elsewhere classified: Secondary | ICD-10-CM | POA: Diagnosis not present

## 2021-02-01 DIAGNOSIS — M6281 Muscle weakness (generalized): Secondary | ICD-10-CM | POA: Diagnosis not present

## 2021-02-01 DIAGNOSIS — S82092D Other fracture of left patella, subsequent encounter for closed fracture with routine healing: Secondary | ICD-10-CM | POA: Diagnosis not present

## 2021-02-03 DIAGNOSIS — M25662 Stiffness of left knee, not elsewhere classified: Secondary | ICD-10-CM | POA: Diagnosis not present

## 2021-02-03 DIAGNOSIS — S82092D Other fracture of left patella, subsequent encounter for closed fracture with routine healing: Secondary | ICD-10-CM | POA: Diagnosis not present

## 2021-02-03 DIAGNOSIS — M6281 Muscle weakness (generalized): Secondary | ICD-10-CM | POA: Diagnosis not present

## 2021-02-15 DIAGNOSIS — M6281 Muscle weakness (generalized): Secondary | ICD-10-CM | POA: Diagnosis not present

## 2021-02-15 DIAGNOSIS — S82092D Other fracture of left patella, subsequent encounter for closed fracture with routine healing: Secondary | ICD-10-CM | POA: Diagnosis not present

## 2021-02-15 DIAGNOSIS — M25662 Stiffness of left knee, not elsewhere classified: Secondary | ICD-10-CM | POA: Diagnosis not present

## 2021-02-17 DIAGNOSIS — M25662 Stiffness of left knee, not elsewhere classified: Secondary | ICD-10-CM | POA: Diagnosis not present

## 2021-02-17 DIAGNOSIS — M6281 Muscle weakness (generalized): Secondary | ICD-10-CM | POA: Diagnosis not present

## 2021-02-17 DIAGNOSIS — S82092D Other fracture of left patella, subsequent encounter for closed fracture with routine healing: Secondary | ICD-10-CM | POA: Diagnosis not present

## 2021-02-22 DIAGNOSIS — S82092D Other fracture of left patella, subsequent encounter for closed fracture with routine healing: Secondary | ICD-10-CM | POA: Diagnosis not present

## 2021-02-22 DIAGNOSIS — M25662 Stiffness of left knee, not elsewhere classified: Secondary | ICD-10-CM | POA: Diagnosis not present

## 2021-02-22 DIAGNOSIS — M6281 Muscle weakness (generalized): Secondary | ICD-10-CM | POA: Diagnosis not present

## 2021-02-24 DIAGNOSIS — M6281 Muscle weakness (generalized): Secondary | ICD-10-CM | POA: Diagnosis not present

## 2021-02-24 DIAGNOSIS — S82092D Other fracture of left patella, subsequent encounter for closed fracture with routine healing: Secondary | ICD-10-CM | POA: Diagnosis not present

## 2021-02-24 DIAGNOSIS — M25662 Stiffness of left knee, not elsewhere classified: Secondary | ICD-10-CM | POA: Diagnosis not present

## 2021-02-28 DIAGNOSIS — H401133 Primary open-angle glaucoma, bilateral, severe stage: Secondary | ICD-10-CM | POA: Diagnosis not present

## 2021-03-01 DIAGNOSIS — S82092D Other fracture of left patella, subsequent encounter for closed fracture with routine healing: Secondary | ICD-10-CM | POA: Diagnosis not present

## 2021-03-01 DIAGNOSIS — M25662 Stiffness of left knee, not elsewhere classified: Secondary | ICD-10-CM | POA: Diagnosis not present

## 2021-03-01 DIAGNOSIS — M6281 Muscle weakness (generalized): Secondary | ICD-10-CM | POA: Diagnosis not present
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
## 2021-05-29 NOTE — Patient Instructions (Addendum)
?HEALTH MAINTENANCE RECOMMENDATIONS: ? ?It is recommended that you get at least 30 minutes of aerobic exercise at least 5 days/week (for weight loss, you may need as much as 60-90 minutes). This can be any activity that gets your heart rate up. This can be divided in 10-15 minute intervals if needed, but try and build up your endurance at least once a week.  Weight bearing exercise is also recommended twice weekly. ? ?Eat a healthy diet with lots of vegetables, fruits and fiber.  "Colorful" foods have a lot of vitamins (ie green vegetables, tomatoes, red peppers, etc).  Limit sweet tea, regular sodas and alcoholic beverages, all of which has a lot of calories and sugar.  Up to 1 alcoholic drink daily may be beneficial for women (unless trying to lose weight, watch sugars).  Drink a lot of water. ? ?Calcium recommendations are 1200-1500 mg daily (1500 mg for postmenopausal women or women without ovaries), and vitamin D 1000 IU daily.  This should be obtained from diet and/or supplements (vitamins), and calcium should not be taken all at once, but in divided doses. ? ?Monthly self breast exams and yearly mammograms for women over the age of 28 is recommended. ? ?Sunscreen of at least SPF 30 should be used on all sun-exposed parts of the skin when outside between the hours of 10 am and 4 pm (not just when at beach or pool, but even with exercise, golf, tennis, and yard work!)  Use a sunscreen that says "broad spectrum" so it covers both UVA and UVB rays, and make sure to reapply every 1-2 hours. ? ?Remember to change the batteries in your smoke detectors when changing your clock times in the spring and fall. Carbon monoxide detectors are recommended for your home. ? ?Use your seat belt every time you are in a car, and please drive safely and not be distracted with cell phones and texting while driving. ? ? ?Ms. Jarold Motto , ?Thank you for taking time to come for your Medicare Wellness Visit. I appreciate your ongoing  commitment to your health goals. Please review the following plan we discussed and let me know if I can assist you in the future.  ? ?This is a list of the screening recommended for you and due dates:  ?Health Maintenance  ?Topic Date Due  ? Zoster (Shingles) Vaccine (1 of 2) Never done  ? Tetanus Vaccine  04/14/2019  ? COVID-19 Vaccine (3 - Booster for Pfizer series) 08/25/2019  ? Flu Shot  10/18/2020  ? Colon Cancer Screening  08/01/2021  ? Mammogram  10/06/2022  ? Pneumonia Vaccine  Completed  ? DEXA scan (bone density measurement)  Completed  ? Hepatitis C Screening: USPSTF Recommendation to screen - Ages 41-79 yo.  Completed  ? HPV Vaccine  Aged Out  ? ? ?Tdap (tetanus booster) is recommended, you need to get this from the pharmacy. ? ?Remember to get your second shingles vaccine.  This can be given the same day as your tetanus shot, or you need to wait 2 weeks in between vaccines. ?Tetanus and shingles vaccines no longer have an associated copay from the pharmacy (this is a new change as of 03/2021!) ? ?I encourage you to get the newer bivalent COVID vaccine. ? ?Flu shots (high dose) are encouraged yearly.  Please get these in the Fall (September/October). ? ?You are past due to see Dr. Raul Del for follow-up.  Please contact his office to schedule this appointment, and to get your medications refilled.  ? ?  If you don't hear from Dr. Loreta Ave in May, it is worth calling her office to discuss screening options, if indicated. ? ?Your CT scan from August did show atherosclerosis in the aorta.  The recommendation is to start you on a statin medication. ?I'll wait to see what your cholesterol looks like today before determining which statin and dose. ?If you'd like to also discuss this with your cardiologist, you may (but I don't think he will tell you NOT to start it). ? ?

## 2021-05-30 ENCOUNTER — Ambulatory Visit: Payer: Medicare PPO | Admitting: Family Medicine

## 2021-05-30 ENCOUNTER — Other Ambulatory Visit: Payer: Self-pay

## 2021-05-30 ENCOUNTER — Encounter: Payer: Self-pay | Admitting: Family Medicine

## 2021-05-30 VITALS — BP 118/62 | HR 60 | Ht 66.5 in | Wt 139.8 lb

## 2021-05-30 DIAGNOSIS — E7800 Pure hypercholesterolemia, unspecified: Secondary | ICD-10-CM | POA: Diagnosis not present

## 2021-05-30 DIAGNOSIS — R001 Bradycardia, unspecified: Secondary | ICD-10-CM

## 2021-05-30 DIAGNOSIS — M25562 Pain in left knee: Secondary | ICD-10-CM | POA: Diagnosis not present

## 2021-05-30 DIAGNOSIS — M810 Age-related osteoporosis without current pathological fracture: Secondary | ICD-10-CM | POA: Diagnosis not present

## 2021-05-30 DIAGNOSIS — Z5181 Encounter for therapeutic drug level monitoring: Secondary | ICD-10-CM | POA: Diagnosis not present

## 2021-05-30 DIAGNOSIS — Z7901 Long term (current) use of anticoagulants: Secondary | ICD-10-CM

## 2021-05-30 DIAGNOSIS — Z0000 Encounter for general adult medical examination without abnormal findings: Secondary | ICD-10-CM

## 2021-05-30 DIAGNOSIS — I480 Paroxysmal atrial fibrillation: Secondary | ICD-10-CM | POA: Diagnosis not present

## 2021-05-30 DIAGNOSIS — I700 Atherosclerosis of aorta: Secondary | ICD-10-CM

## 2021-05-31 LAB — LIPID PANEL
Chol/HDL Ratio: 3.3 ratio (ref 0.0–4.4)
Cholesterol, Total: 231 mg/dL — ABNORMAL HIGH (ref 100–199)
HDL: 70 mg/dL (ref >39)
LDL Chol Calc (NIH): 143 mg/dL — ABNORMAL HIGH (ref 0–99)
Triglycerides: 103 mg/dL (ref 0–149)
VLDL Cholesterol Cal: 18 mg/dL (ref 5–40)

## 2021-05-31 LAB — COMPREHENSIVE METABOLIC PANEL
ALT: 16 IU/L (ref 0–32)
AST: 25 IU/L (ref 0–40)
Albumin/Globulin Ratio: 1.9 (ref 1.2–2.2)
Albumin: 4.8 g/dL — ABNORMAL HIGH (ref 3.7–4.7)
Alkaline Phosphatase: 49 IU/L (ref 44–121)
BUN/Creatinine Ratio: 26 (ref 12–28)
BUN: 24 mg/dL (ref 8–27)
Bilirubin Total: 0.3 mg/dL (ref 0.0–1.2)
CO2: 25 mmol/L (ref 20–29)
Calcium: 10.1 mg/dL (ref 8.7–10.3)
Chloride: 105 mmol/L (ref 96–106)
Creatinine, Ser: 0.92 mg/dL (ref 0.57–1.00)
Globulin, Total: 2.5 g/dL (ref 1.5–4.5)
Glucose: 88 mg/dL (ref 70–99)
Potassium: 4.6 mmol/L (ref 3.5–5.2)
Sodium: 143 mmol/L (ref 134–144)
Total Protein: 7.3 g/dL (ref 6.0–8.5)
eGFR: 67 mL/min/{1.73_m2} (ref >59)

## 2021-05-31 LAB — CBC WITH DIFFERENTIAL/PLATELET
Basophils Absolute: 0 10*3/uL (ref 0.0–0.2)
Basos: 1 %
EOS (ABSOLUTE): 0.1 10*3/uL (ref 0.0–0.4)
Eos: 1 %
Hematocrit: 41.8 % (ref 34.0–46.6)
Hemoglobin: 13.9 g/dL (ref 11.1–15.9)
Immature Grans (Abs): 0 10*3/uL (ref 0.0–0.1)
Immature Granulocytes: 0 %
Lymphocytes Absolute: 1.5 10*3/uL (ref 0.7–3.1)
Lymphs: 25 %
MCH: 30.6 pg (ref 26.6–33.0)
MCHC: 33.3 g/dL (ref 31.5–35.7)
MCV: 92 fL (ref 79–97)
Monocytes Absolute: 0.7 10*3/uL (ref 0.1–0.9)
Monocytes: 11 %
Neutrophils Absolute: 3.8 10*3/uL (ref 1.4–7.0)
Neutrophils: 62 %
Platelets: 313 10*3/uL (ref 150–450)
RBC: 4.54 x10E6/uL (ref 3.77–5.28)
RDW: 12.5 % (ref 11.7–15.4)
WBC: 6.1 10*3/uL (ref 3.4–10.8)

## 2021-05-31 MED ORDER — ROSUVASTATIN CALCIUM 20 MG PO TABS: 20.0000 mg | ORAL_TABLET | ORAL | 1 refills | Status: DC

## 2021-05-31 NOTE — Progress Notes (Signed)
I entered future orders. Please ensure patient has read/understands MyChart message and set up a fasting lab visit for early May. We are starting once weekly 20mg  Crestor (pt preferred once/week over daily, we shall see if this works), due to high cholesterol and aortic atherosclerosis.

## 2021-05-31 NOTE — Addendum Note (Signed)
Addended byJoselyn Arrow on: 05/31/2021 11:15 AM ? ? Modules accepted: Orders ? ?

## 2021-06-09 ENCOUNTER — Ambulatory Visit: Payer: Medicare PPO | Admitting: Cardiology

## 2021-06-09 ENCOUNTER — Other Ambulatory Visit: Payer: Self-pay

## 2021-06-09 ENCOUNTER — Encounter: Payer: Self-pay | Admitting: Cardiology

## 2021-06-09 VITALS — BP 124/70 | HR 56 | Ht 66.5 in | Wt 143.0 lb

## 2021-06-09 DIAGNOSIS — I480 Paroxysmal atrial fibrillation: Secondary | ICD-10-CM | POA: Diagnosis not present

## 2021-06-09 NOTE — Progress Notes (Signed)
? ?Electrophysiology Office Note ? ? ?Date:  06/09/2021  ? ?ID:  Paige Klein, DOB [birth year], MRN [id number] ? ?PCP:  Rita Ohara, MD  ?Cardiologist:  Marlou Porch ?Primary Electrophysiologist:  Dr Curt Bears   ? ?CC: Follow up for paroxysmal atrial fibrillation ?  ?History of Present Illness: ?Paige Klein is a 71 y.o. female who is being seen today for the evaluation of atrial fibrillation at the request of Paige Klein. Presenting today for electrophysiology evaluation.   ? ?History significant for atrial fibrillation and hyperlipidemia.  Atrial fibrillation was diagnosed 05/22/2017.  She was found to be in atrial fibrillation with rapid response.  She was started on diltiazem 120 mg daily as well as Eliquis.  She did not want to be on a beta-blocker due to fatigue.  She now takes diltiazem on an as-needed basis. ? ?Today, denies symptoms of palpitations, chest pain, shortness of breath, orthopnea, PND, lower extremity edema, claudication, dizziness, presyncope, syncope, bleeding, or neurologic sequela. The patient is tolerating medications without difficulties.  Since being seen she has done well.  She has had no chest pain or shortness of breath.  She continues to have short episodes of atrial fibrillation.  She notices them most often at night.  She is overall comfortable with her control. ? ?Past Medical History:  ?Diagnosis Date  ? A-fib ([place]) 05/22/2017  ? Abnormal Pap smear of cervix 1999  ? ASGUS x 1  ? Allergic rhinitis, cause unspecified   ? mold; and vasomotor rhinitis  ? Bradycardia 02/25/2014  ? Cataract   ? COVID-19 03/2020  ? Fibrocystic breast changes   ? and also breast calcifications (08/2008)  ? Glaucoma   ? Glaucoma   ? followed by [facility] Dr. Edilia Bo  ? Glaucoma suspect of both eyes   ? high-normal intraocular pressures (Dr. Delman Cheadle)  ? H/O bone density study 09/07/09  ? 08/16/07, 08/08/05; osteoporosis  ? H/O mammogram 11/23/10  ? normal  ? Hypercholesteremia   ? borderline per NMR lipoprofile 2008, 2007   ? Internal hemorrhoids   ? Kidney stone 1986  ? right  ? Migraine headache   ? resolved with menopause (menstrual migraines)  ? MVP (mitral valve prolapse)   ? per echo  ? Osteoporosis   ? Osteoporosis 02/22/2012  ? Pap smear for cervical cancer screening 04/14/09  ? negative for malignancy, +atrophic changes  ? Postmenopausal   ? on compounded cream daily  ? Unspecified vitamin D deficiency 2009  ? Vitamin D deficiency 02/22/2012  ? ?Past Surgical History:  ?Procedure Laterality Date  ? BREAST CYST ASPIRATION    ? many (Dr. Rebekah Chesterfield)  ? BREAST FIBROADENOMA SURGERY  09-03  ? Dr.Gooden  ? cardiolite (stress test)  11/05  ? Dr. Wendi Snipes  ? cataract surgery  2010  ? bilateral  ? CERVICAL POLYPECTOMY  2003  ? treated with cryosurgery  ? COLONOSCOPY  08/02/11; 05/2000  ? Dr. Collene Mares - rectal polyps 2013  ? DILATION AND CURETTAGE OF UTERUS  05/2009  ? uterine polyp, bleeding  ? endocervical cyst  12/96, 6/97  ? Dr Carren Rang  ? EYE SURGERY    ? KYPHOPLASTY N/A 04/29/2015  ? Procedure: KYPHOPLASTY;  Surgeon: Phylliss Bob, MD;  Location: [facility];  Service: Orthopedics;  Laterality: N/A;  Lumbar 1 kyphoplasty  ? PATELLAR TENDON REPAIR Left 10/27/2020  ? Procedure: PATELLA TENDON REPAIR;  Surgeon: Hiram Gash, MD;  Location: [facility];  Service: Orthopedics;  Laterality: Left;  ? PATELLECTOMY Left 10/27/2020  ?  Procedure: PATELLECTOMY PARTIAL;  Surgeon: Hiram Gash, MD;  Location: [facility] [facility];  Service: Orthopedics;  Laterality: Left;  ? ? ? ?Current Outpatient Medications  ?Medication Sig Dispense Refill  ? acetaminophen (TYLENOL) 500 MG tablet Take 2 tablets (1,000 mg total) by mouth every 8 (eight) hours as needed. 30 tablet 0  ? calcium citrate-vitamin D (CITRACAL+D) 315-200 MG-UNIT tablet Take 2 tablets by mouth at bedtime.    ? denosumab (PROLIA) 60 MG/ML SOLN injection Inject 60 mg into the skin every 6 (six) months. Administer in upper arm, thigh, or abdomen    ? diltiazem (CARDIZEM) 30 MG tablet Take 30 mg by mouth as needed (a-fib).    ?  dorzolamide (TRUSOPT) 2 % ophthalmic solution Place 1 drop into both eyes 3 (three) times daily.  3  ? ELIQUIS 5 MG TABS tablet TAKE 1 TABLET BY MOUTH TWICE A DAY (Patient taking differently: Take 5 mg by mouth 2 (two) times daily.) 180 tablet 3  ? loratadine (CLARITIN) 10 MG tablet Take 10 mg by mouth daily.    ? LUTEIN PO Take 1 capsule by mouth at bedtime.    ? Multiple Vitamins-Minerals (CENTRUM SILVER PO) Take 1 tablet by mouth daily.    ? Netarsudil-Latanoprost (ROCKLATAN) 0.02-0.005 % SOLN Place 1 drop into both eyes at bedtime.    ? Olopatadine HCl (PATADAY OP) Apply 1 drop to eye as directed.    ? pilocarpine (PILOCAR) 4 % ophthalmic solution Place 1 drop into both eyes 3 (three) times daily.    ? rosuvastatin (CRESTOR) 20 MG tablet Take 1 tablet (20 mg total) by mouth once a week. 12 tablet 1  ? ?No current facility-administered medications for this visit.  ? ? ?Allergies:   Brinzolamide-brimonidine, Cortisone, Fosamax [alendronate sodium], Timolol, and Tramadol  ? ?Social History:  The patient  reports that she has never smoked. She has never used smokeless tobacco. She reports current alcohol use. She reports that she does not use drugs.  ? ?Family History:  The patient's family history includes Arthritis in her cousin and mother; Cancer in her cousin, cousin, maternal grandfather, maternal grandmother, and mother; Celiac disease in her maternal uncle; Diabetes in her maternal aunt; Hypertension in her father and mother; Macular degeneration in her sister; Muscular dystrophy in her maternal aunt; Other in her father; Ovarian cancer (age of onset: 96) in her cousin.  ? ?ROS:  Please see the history of present illness.   Otherwise, review of systems is positive for none.   All other systems are reviewed and negative.  ? ?PHYSICAL EXAM: ?VS:  BP 124/70   Pulse (!) 56   Ht 5' 6.5" (1.689 m)   Wt 143 lb (64.9 kg)   LMP 03/20/2000   SpO2 98%   BMI 22.74 kg/m?  , BMI Body mass index is 22.74 kg/m?. ?GEN:  Well nourished, well developed, in no acute distress  ?HEENT: normal  ?Neck: no JVD, carotid bruits, or masses ?Cardiac: RRR; no murmurs, rubs, or gallops,no edema  ?Respiratory:  clear to auscultation bilaterally, normal work of breathing ?GI: soft, nontender, nondistended, + BS ?MS: no deformity or atrophy  ?Skin: warm and dry ?Neuro:  Strength and sensation are intact ?Psych: euthymic mood, full affect ? ?EKG:  EKG is not ordered today. ?Personal review of the ekg ordered 10/27/20 shows sinus rhythm, rate 58 ? ?Recent Labs: ?05/30/2021: ALT 16; BUN 24; Creatinine, Ser 0.92; Hemoglobin 13.9; Platelets 313; Potassium 4.6; Sodium 143  ? ? ?Lipid Panel  ?   ?  Component Value Date/Time  ? CHOL 231 (H) 05/30/2021 1050  ? TRIG 103 05/30/2021 1050  ? HDL 70 05/30/2021 1050  ? CHOLHDL 3.3 05/30/2021 1050  ? CHOLHDL 3.0 03/04/2015 0001  ? VLDL 17 03/04/2015 0001  ? LDLCALC 143 (H) 05/30/2021 1050  ? ? ? ?Wt Readings from Last 3 Encounters:  ?06/09/21 143 lb (64.9 kg)  ?05/30/21 139 lb 12.8 oz (63.4 kg)  ?11/04/20 144 lb 6.4 oz (65.5 kg)  ?  ? ? ?Other studies Reviewed: ?Additional studies/ records that were reviewed today include: TTE 05/31/17  ?Review of the above records today demonstrates:  ?- Left ventricle: The cavity size was normal. Wall thickness was ?  normal. Systolic function was normal. The estimated ejection ?  fraction was in the range of 60% to 65%. Wall motion was normal; ?  there were no regional wall motion abnormalities. ?- Mitral valve: Prolapse, involving the anterior leaflet. There was ?  mild to moderate regurgitation. ? ? ?ASSESSMENT AND PLAN: ? ?1.  Paroxysmal atrial fibrillation: Currently on Eliquis 5 mg twice daily, diltiazem as needed.  CHA2DS2-VASc of 2.  Continues to have intermittent though minimal episodes of atrial fibrillation.  She is overall happy with her control.  She understands that she would be a candidate for ablation, but she is not quite ready for that yet.  We [place] continue with  current management. ? ? ?Current medicines are reviewed at length with the patient today.   ?The patient does not have concerns regarding her medicines.  The following changes were made today: none ? ?Labs/ tests

## 2021-06-09 NOTE — Patient Instructions (Signed)
Medication Instructions:  Your physician recommends that you continue on your current medications as directed. Please refer to the Current Medication list given to you today.  *If you need a refill on your cardiac medications before your next appointment, please call your pharmacy*   Lab Work: None ordered.  If you have labs (blood work) drawn today and your tests are completely normal, you will receive your results only by: MyChart Message (if you have MyChart) OR A paper copy in the mail If you have any lab test that is abnormal or we need to change your treatment, we will call you to review the results.   Testing/Procedures: None ordered.    Follow-Up: At [facility], you and your health needs are our priority.  As part of our continuing mission to provide you with exceptional heart care, we have created designated Provider Care Teams.  These Care Teams include your primary Cardiologist (physician) and Advanced Practice Providers (APPs -  Physician Assistants and Nurse Practitioners) who all work together to provide you with the care you need, when you need it.  We recommend signing up for the patient portal called "MyChart".  Sign up information is provided on this After Visit Summary.  MyChart is used to connect with patients for Virtual Visits (Telemedicine).  Patients are able to view lab/test results, encounter notes, upcoming appointments, etc.  Non-urgent messages can be sent to your provider as well.   To learn more about what you can do with MyChart, go to [email or web address].    Your next appointment:   12 month(s)  The format for your next appointment:   In Person  Provider:   You will see one of the following Advanced Practice Providers on your designated Care Team:   Renee Ursuy, PA-C Michael "Andy" Tillery, PA-C     

## 2021-06-10 ENCOUNTER — Other Ambulatory Visit: Payer: Self-pay | Admitting: Cardiology

## 2021-06-10 NOTE — Telephone Encounter (Signed)
Eliquis 5 mg refill request received. Patient is 71 years old, weight- 64.9 kg, Crea- 0.92 on 05/31/21, Diagnosis- PAF, and last seen by Dr. Elberta Fortis on 06/09/21. Dose is appropriate based on dosing criteria. Will send in refill to requested pharmacy.   ?

## 2021-06-20 DIAGNOSIS — H401133 Primary open-angle glaucoma, bilateral, severe stage: Secondary | ICD-10-CM | POA: Diagnosis not present

## 2021-07-20 ENCOUNTER — Other Ambulatory Visit (INDEPENDENT_AMBULATORY_CARE_PROVIDER_SITE_OTHER): Payer: Medicare PPO

## 2021-07-20 DIAGNOSIS — M810 Age-related osteoporosis without current pathological fracture: Secondary | ICD-10-CM

## 2021-07-20 MED ORDER — DENOSUMAB 60 MG/ML ~~LOC~~ SOSY
60.0000 mg | PREFILLED_SYRINGE | Freq: Once | SUBCUTANEOUS | Status: AC
  Administered 2021-07-20: 60 mg via SUBCUTANEOUS

## 2021-09-01 ENCOUNTER — Other Ambulatory Visit: Payer: Self-pay

## 2021-09-01 ENCOUNTER — Other Ambulatory Visit: Payer: Medicare PPO

## 2021-09-01 DIAGNOSIS — Z5181 Encounter for therapeutic drug level monitoring: Secondary | ICD-10-CM

## 2021-09-01 DIAGNOSIS — E7800 Pure hypercholesterolemia, unspecified: Secondary | ICD-10-CM

## 2021-09-02 LAB — HEPATIC FUNCTION PANEL
ALT: 14 IU/L (ref 0–32)
AST: 16 IU/L (ref 0–40)
Albumin: 4.5 g/dL (ref 3.7–4.7)
Alkaline Phosphatase: 47 IU/L (ref 44–121)
Bilirubin Total: 0.3 mg/dL (ref 0.0–1.2)
Bilirubin, Direct: <0.1 mg/dL (ref 0.00–0.40)
Total Protein: 6.8 g/dL (ref 6.0–8.5)

## 2021-09-02 LAB — LIPID PANEL
Chol/HDL Ratio: 3.3 ratio (ref 0.0–4.4)
Cholesterol, Total: 185 mg/dL (ref 100–199)
HDL: 56 mg/dL (ref >39)
LDL Chol Calc (NIH): 111 mg/dL — ABNORMAL HIGH (ref 0–99)
Triglycerides: 101 mg/dL (ref 0–149)
VLDL Cholesterol Cal: 18 mg/dL (ref 5–40)
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
## 2021-10-11 DIAGNOSIS — Z1231 Encounter for screening mammogram for malignant neoplasm of breast: Secondary | ICD-10-CM | POA: Diagnosis not present

## 2021-10-13 DIAGNOSIS — Z1211 Encounter for screening for malignant neoplasm of colon: Secondary | ICD-10-CM | POA: Diagnosis not present

## 2021-10-13 DIAGNOSIS — R194 Change in bowel habit: Secondary | ICD-10-CM | POA: Diagnosis not present

## 2021-10-13 DIAGNOSIS — Z8379 Family history of other diseases of the digestive system: Secondary | ICD-10-CM | POA: Diagnosis not present

## 2021-10-17 DIAGNOSIS — H401133 Primary open-angle glaucoma, bilateral, severe stage: Secondary | ICD-10-CM | POA: Diagnosis not present

## 2021-10-19 ENCOUNTER — Telehealth: Payer: Self-pay | Admitting: *Deleted

## 2021-10-19 DIAGNOSIS — R922 Inconclusive mammogram: Secondary | ICD-10-CM | POA: Diagnosis not present

## 2021-10-19 LAB — HM MAMMOGRAPHY

## 2021-10-19 NOTE — Telephone Encounter (Signed)
   Name: Paige Klein  DOB: 10-16-1950  MRN: [id number]  Primary Cardiologist: [name], MD  Chart reviewed as part of pre-operative protocol coverage. Because of [name]'s past medical history and time since last visit, she will require a follow-up tele visit in order to better assess preoperative cardiovascular risk.  Pre-op covering staff: - Please schedule appointment and call patient to inform them. If patient already had an upcoming appointment within acceptable timeframe, please add "pre-op clearance" to the appointment notes so provider is aware. - Please contact requesting surgeon's office via preferred method (i.e, phone, fax) to inform them of need for appointment prior to surgery.  This message will also be routed to pharmacy pool  for input on holding Eliquis as requested below so that this information is available to the clearing provider at time of patient's appointment.   Sharlene Dory, PA-C  10/19/2021, 11:12 AM

## 2021-10-19 NOTE — Telephone Encounter (Signed)
   Pre-operative Risk Assessment    Patient Name: Paige Klein  DOB: [birth year] MRN: [id number]      Request for Surgical Clearance    Procedure:   COLONOSCOPY   Date of Surgery:  Clearance 11/23/21                                 Surgeon:   Surgeon's Group or Practice Name:  [facility] Phone number:  [phone] Fax number:  [phone]   Type of Clearance Requested:   - Pharmacy:  Hold Apixaban (Eliquis) NOT INDICATED   Type of Anesthesia:   PROPOFOL   Additional requests/questions:    Wilhemina Cash   10/19/2021, 9:31 AM

## 2021-10-19 NOTE — Telephone Encounter (Signed)
1st attempt to reach pt regarding surgical clearance and the need for a tele visit, no answer / no machine.

## 2021-10-20 ENCOUNTER — Telehealth: Payer: Self-pay | Admitting: *Deleted

## 2021-10-20 ENCOUNTER — Encounter: Payer: Self-pay | Admitting: *Deleted

## 2021-10-20 NOTE — Telephone Encounter (Signed)
Patient with diagnosis of atrial fibrillation on Eliquis for anticoagulation.    Procedure: colonoscopy Date of procedure: 11/23/21   CHA2DS2-VASc Score = 2   This indicates a 2.2% annual risk of stroke. The patient's score is based upon: CHF History: 0 HTN History: 0 Diabetes History: 0 Stroke History: 0 Vascular Disease History: 0 Age Score: 1 Gender Score: 1     CrCl 58 Platelet count 313  Per office protocol, patient can hold Eliquis for 2 days prior to procedure.   Patient will not need bridging with Lovenox (enoxaparin) around procedure.  **This guidance is not considered finalized until pre-operative APP has relayed final recommendations.**

## 2021-10-20 NOTE — Telephone Encounter (Signed)
Pt agreeable to tele pre op appt 11/07/21 @ 2:20. Med rec and consent are done.

## 2021-10-20 NOTE — Telephone Encounter (Signed)
Pt agreeable to tele pre op appt 11/07/21 @ 2:20. Med rec and consent are done.     Patient Consent for Virtual Visit        Paige Klein has provided verbal consent on 10/20/2021 for a virtual visit (video or telephone).   CONSENT FOR VIRTUAL VISIT FOR:  Paige Klein  By participating in this virtual visit I agree to the following:  I hereby voluntarily request, consent and authorize [facility] and its employed or contracted physicians, physician assistants, nurse practitioners or other licensed health care professionals (the Practitioner), to provide me with telemedicine health care services (the "Services") as deemed necessary by the treating Practitioner. I acknowledge and consent to receive the Services by the Practitioner via telemedicine. I understand that the telemedicine visit will involve communicating with the Practitioner through live audiovisual communication technology and the disclosure of certain medical information by electronic transmission. I acknowledge that I have been given the opportunity to request an in-person assessment or other available alternative prior to the telemedicine visit and am voluntarily participating in the telemedicine visit.  I understand that I have the right to withhold or withdraw my consent to the use of telemedicine in the course of my care at any time, without affecting my right to future care or treatment, and that the Practitioner or I may terminate the telemedicine visit at any time. I understand that I have the right to inspect all information obtained and/or recorded in the course of the telemedicine visit and may receive copies of available information for a reasonable fee.  I understand that some of the potential risks of receiving the Services via telemedicine include:  Delay or interruption in medical evaluation due to technological equipment failure or disruption; Information transmitted may not be sufficient (e.g. poor  resolution of images) to allow for appropriate medical decision making by the Practitioner; and/or  In rare instances, security protocols could fail, causing a breach of personal health information.  Furthermore, I acknowledge that it is my responsibility to provide information about my medical history, conditions and care that is complete and accurate to the best of my ability. I acknowledge that Practitioner's advice, recommendations, and/or decision may be based on factors not within their control, such as incomplete or inaccurate data provided by me or distortions of diagnostic images or specimens that may result from electronic transmissions. I understand that the practice of medicine is not an exact science and that Practitioner makes no warranties or guarantees regarding treatment outcomes. I acknowledge that a copy of this consent can be made available to me via my patient portal [facility] MyChart), or I can request a printed copy by calling the office of [facility].    I understand that my insurance will be billed for this visit.   I have read or had this consent read to me. I understand the contents of this consent, which adequately explains the benefits and risks of the Services being provided via telemedicine.  I have been provided ample opportunity to ask questions regarding this consent and the Services and have had my questions answered to my satisfaction. I give my informed consent for the services to be provided through the use of telemedicine in my medical care

## 2021-11-07 ENCOUNTER — Ambulatory Visit (INDEPENDENT_AMBULATORY_CARE_PROVIDER_SITE_OTHER): Payer: Medicare PPO | Admitting: Physician Assistant

## 2021-11-07 DIAGNOSIS — Z01810 Encounter for preprocedural cardiovascular examination: Secondary | ICD-10-CM

## 2021-11-07 NOTE — Progress Notes (Signed)
Virtual Visit via Telephone Note   Because of Paige Klein's co-morbid illnesses, she is at least at moderate risk for complications without adequate follow up.  This format is felt to be most appropriate for this patient at this time.  The patient did not have access to video technology/had technical difficulties with video requiring transitioning to audio format only (telephone).  All issues noted in this document were discussed and addressed.  No physical exam could be performed with this format.  Please refer to the patient's chart for her consent to telehealth for Paige Klein.  Evaluation Performed:  Preoperative cardiovascular risk assessment _____________   Date:  11/07/2021   Patient ID:  Paige Klein, DOB [birth year], MRN [id number] Patient Location:  Home Provider location:   Office  Primary Care Provider:  Rita Ohara, MD Primary Cardiologist:  Will Meredith Leeds, MD  Chief Complaint / Patient Profile   71 y.o. y/o female with a h/o atrial fibrillation, HTN, HLD, glaucoma, migraines, MVP with mild-moderate MR by echo 2019 who is pending colonoscopy and presents today for telephonic preoperative cardiovascular risk assessment.  Past Medical History    Past Medical History:  Diagnosis Date   A-fib ([place]) 05/22/2017   Abnormal Pap smear of cervix 1999   ASGUS x 1   Allergic rhinitis, cause unspecified    mold; and vasomotor rhinitis   Bradycardia 02/25/2014   Cataract    COVID-19 03/2020   Fibrocystic breast changes    and also breast calcifications (08/2008)   Glaucoma    Glaucoma    followed by [facility] Dr. Edilia Bo   Glaucoma suspect of both eyes    high-normal intraocular pressures (Dr. Delman Cheadle)   H/O bone density study 09/07/09   08/16/07, 08/08/05; osteoporosis   H/O mammogram 11/23/10   normal   Hypercholesteremia    borderline per NMR lipoprofile 2008, 2007   Internal hemorrhoids    Kidney stone 1986   right   Migraine headache    resolved with  menopause (menstrual migraines)   MVP (mitral valve prolapse)    per echo   Osteoporosis    Osteoporosis 02/22/2012   Pap smear for cervical cancer screening 04/14/09   negative for malignancy, +atrophic changes   Postmenopausal    on compounded cream daily   Unspecified vitamin D deficiency 2009   Vitamin D deficiency 02/22/2012   Past Surgical History:  Procedure Laterality Date   BREAST CYST ASPIRATION     many (Dr. Rebekah Chesterfield)   [place]  09-03   Dr.Gooden   cardiolite (stress test)  11/05   Dr. Wendi Snipes   cataract surgery  2010   bilateral   CERVICAL POLYPECTOMY  2003   treated with cryosurgery   COLONOSCOPY  08/02/11; 05/2000   Dr. Collene Mares - rectal polyps 2013   DILATION AND CURETTAGE OF UTERUS  05/2009   uterine polyp, bleeding   endocervical cyst  12/96, 6/97   Dr Carren Rang   EYE SURGERY     KYPHOPLASTY N/A 04/29/2015   Procedure: KYPHOPLASTY;  Surgeon: Phylliss Bob, MD;  Location: [facility];  Service: Orthopedics;  Laterality: N/A;  Lumbar 1 kyphoplasty   PATELLAR TENDON REPAIR Left 10/27/2020   Procedure: PATELLA TENDON REPAIR;  Surgeon: Hiram Gash, MD;  Location: [facility];  Service: Orthopedics;  Laterality: Left;   PATELLECTOMY Left 10/27/2020   Procedure: PATELLECTOMY PARTIAL;  Surgeon: Hiram Gash, MD;  Location: [facility];  Service: Orthopedics;  Laterality: Left;    Allergies  Allergies  Allergen Reactions   Brinzolamide-Brimonidine Itching   Cortisone Other (See Comments)    Entire body hurt from a cortisone shot.   Fosamax [Alendronate Sodium]     GI upset, bone pain; tolerated Actonel   Timolol Other (See Comments)    Slowed her heart rate down   Tramadol Rash    History of Present Illness    Paige Klein is a 71 y.o. female who presents via audio/video conferencing for a telehealth visit today.  Pt was last seen in cardiology clinic on 05/2021 by Dr. Elberta Fortis.  At that time Paige Klein was doing well.  The patient is now pending  procedure as outlined above. Since her last visit, she overall has done well. She reports that in the past she's historically had some episodic mild lightheadedness upon coming in from outside or shortness of breath with higher levels of exertion that she states she's mentioned to Dr. Elberta Fortis though states [facility] and a more regular routine this has been better. This is mentioned in OVs back to 04/2020. She is able to garden otherwise for many hours without any cardiac symptoms, main limitation there is her back. She has no recent concerns of chest pain, palpitations, near-syncope or syncope. She overall feels she has been stable from a cardiac standpoint. Last echo 05/2017 EF 60-65%, MVP with mild-moderate MR.   Home Medications    Prior to Admission medications   Medication Sig Start Date End Date Taking? Authorizing Provider  acetaminophen (TYLENOL) 500 MG tablet Take 2 tablets (1,000 mg total) by mouth every 8 (eight) hours as needed. 10/29/20   Maczis, Elmer Sow, PA-C  calcium citrate-vitamin D (CITRACAL+D) 315-200 MG-UNIT tablet Take 2 tablets by mouth at bedtime.    [provider]  denosumab (PROLIA) 60 MG/ML SOLN injection Inject 60 mg into the skin every 6 (six) months. Administer in upper arm, thigh, or abdomen    [provider]  diltiazem (CARDIZEM) 30 MG tablet Take 30 mg by mouth as needed (a-fib).    [provider]  dorzolamide (TRUSOPT) 2 % ophthalmic solution Place 1 drop into both eyes 3 (three) times daily. 01/20/15   [provider]  ELIQUIS 5 MG TABS tablet TAKE 1 TABLET BY MOUTH TWICE A DAY 06/10/21   Camnitz, Will Daphine Deutscher, MD  loratadine (CLARITIN) 10 MG tablet Take 10 mg by mouth daily.    [provider]  LUTEIN PO Take 1 capsule by mouth at bedtime.    [provider]  Multiple Vitamins-Minerals (CENTRUM SILVER PO) Take 1 tablet by mouth daily.    [provider]  Netarsudil-Latanoprost  (ROCKLATAN) 0.02-0.005 % SOLN Place 1 drop into both eyes at bedtime.    [provider]  Olopatadine HCl (PATADAY OP) Apply 1 drop to eye as directed.    [provider]  pilocarpine (PILOCAR) 4 % ophthalmic solution Place 1 drop into both eyes 3 (three) times daily.    [provider]  rosuvastatin (CRESTOR) 20 MG tablet Take 1 tablet (20 mg total) by mouth once a week. 05/31/21   [name], MD    Physical Exam    Vital Signs:  Neldon Mc does not have vital signs available for review today.  Given telephonic nature of communication, physical exam is limited. AAOx3. NAD. Normal affect.  Speech and respirations are unlabored.  Accessory Clinical Findings    None  Assessment & Plan    1.  Preoperative Cardiovascular Risk Assessment: RCRI 0.4% indicating low CV risk. She reports stability in symptoms. No recent DCCV. Therefore, based on ACC/AHA guidelines, the patient would be at acceptable risk for the planned procedure without further cardiovascular testing. The patient was advised that if she develops new symptoms prior to surgery to contact our office to arrange for a follow-up visit, and she verbalized understanding. I did review the patient's prior echocardiogram from 2019 before the visit today with Dr. Elberta Fortis. Per our discussion he feels it would be helpful to get an updated echocardiogram to reassess though did not feel this needed to be completed prior to colonoscopy. I will send a message to Dr. Gershon Crane nurse to help arrange this. Dr. Elberta Fortis felt that if a valvular issue persisted or worsened, he would suggest getting her back to Dr. Anne Fu to follow this while he continues to follow her for her atrial fib.  Per pharmD, per office protocol, patient can hold Eliquis for 2 days prior to procedure. Patient will not need bridging with Lovenox (enoxaparin) around procedure.  A copy of this note will be routed to requesting surgeon.  Time:    Today, I have spent 6 minutes with the patient with telehealth technology discussing medical history, symptoms, and management plan.     Laurann Montana, PA-C  11/07/2021, 2:24 PM

## 2021-11-09 ENCOUNTER — Other Ambulatory Visit: Payer: Self-pay | Admitting: *Deleted

## 2021-11-09 DIAGNOSIS — I340 Nonrheumatic mitral (valve) insufficiency: Secondary | ICD-10-CM

## 2021-11-17 ENCOUNTER — Ambulatory Visit (HOSPITAL_COMMUNITY): Payer: Medicare PPO | Attending: Internal Medicine

## 2021-11-17 DIAGNOSIS — I340 Nonrheumatic mitral (valve) insufficiency: Secondary | ICD-10-CM | POA: Insufficient documentation

## 2021-11-17 LAB — ECHOCARDIOGRAM COMPLETE
AR max vel: 2.75 cm2
AV Area VTI: 2.55 cm2
AV Area mean vel: 2.47 cm2
AV Mean grad: 3 mmHg
AV Peak grad: 6.1 mmHg
Ao pk vel: 1.23 m/s
Area-P 1/2: 3.27 cm2
P 1/2 time: 534 msec
S' Lateral: 2.5 cm

## 2021-11-21 ENCOUNTER — Other Ambulatory Visit: Payer: Self-pay | Admitting: Family Medicine

## 2021-11-21 DIAGNOSIS — I700 Atherosclerosis of aorta: Secondary | ICD-10-CM

## 2021-11-21 DIAGNOSIS — E7800 Pure hypercholesterolemia, unspecified: Secondary | ICD-10-CM

## 2021-11-22 NOTE — Telephone Encounter (Signed)
Has an appt next year scheduled

## 2021-11-23 ENCOUNTER — Encounter: Payer: Self-pay | Admitting: Internal Medicine

## 2021-11-23 DIAGNOSIS — Z1211 Encounter for screening for malignant neoplasm of colon: Secondary | ICD-10-CM | POA: Diagnosis not present

## 2021-11-23 DIAGNOSIS — R194 Change in bowel habit: Secondary | ICD-10-CM | POA: Diagnosis not present

## 2021-11-23 DIAGNOSIS — K635 Polyp of colon: Secondary | ICD-10-CM | POA: Diagnosis not present

## 2021-11-23 DIAGNOSIS — D122 Benign neoplasm of ascending colon: Secondary | ICD-10-CM | POA: Diagnosis not present

## 2021-11-23 LAB — HM COLONOSCOPY

## 2021-11-25 ENCOUNTER — Encounter: Payer: Self-pay | Admitting: Family Medicine

## 2021-11-25 LAB — HM COLONOSCOPY

## 2021-11-30 ENCOUNTER — Encounter: Payer: Self-pay | Admitting: Family Medicine

## 2021-12-08 ENCOUNTER — Telehealth: Payer: Self-pay | Admitting: Licensed Clinical Social Worker

## 2021-12-08 NOTE — Patient Outreach (Addendum)
  Care Coordination   Initial Visit Note   12/08/2021 Name: Paige Klein MRN: [id number] DOB: [birth year]  Paige Klein is a 71 y.o. year old female who sees Rita Ohara, MD for primary care. I spoke with  Marliss Czar by phone today.  What matters to the patients health and wellness today?  Care Coordination    Goals Addressed             This Visit's Progress    COMPLETED: Care Coordination Activities-No Follow Up       Care Coordination Interventions: Active listening / Reflection utilized  Emotional Support Provided Pt was informed of availability of flu shot. Pt will address with PCP at upcoming appt LCSW informed patient of care coordination services. Pt is not interested at this time and agreed to contact PCP, should needs arise        SDOH assessments and interventions completed:  No     Care Coordination Interventions Activated:  Yes  Care Coordination Interventions:  Yes, provided   Follow up plan: No further intervention required.   Encounter Outcome:  Pt. Refused   Christa See, MSW, [email or web address] Phone [phone] 4:29 PM

## 2021-12-08 NOTE — Patient Instructions (Signed)
Visit Information  Thank you for taking time to visit with me today. Please don't hesitate to contact me if I can be of assistance to you.   Following are the goals we discussed today:   Goals Addressed             This Visit's Progress    COMPLETED: Care Coordination Activities-No Follow Up       Care Coordination Interventions: Active listening / Reflection utilized  Emotional Support Provided LCSW informed patient of care coordination services. Pt is not interested at this time and agreed to contact PCP, should needs arise        If you are experiencing a Mental Health or [place] or need someone to talk to, please call the Suicide and Crisis Lifeline: 988 call 911   Patient verbalizes understanding of instructions and care plan provided today and agrees to view in [place]. Active MyChart status and patient understanding of how to access instructions and care plan via MyChart confirmed with patient.     No further follow up required:    Christa See, MSW, [email or web address] Phone [phone] 4:28 PM

## 2021-12-19 ENCOUNTER — Encounter: Payer: Self-pay | Admitting: Family Medicine

## 2021-12-19 ENCOUNTER — Ambulatory Visit: Payer: Medicare PPO | Admitting: Family Medicine

## 2021-12-19 VITALS — BP 136/78 | HR 60 | Temp 98.6°F | Ht 66.5 in | Wt 134.0 lb

## 2021-12-19 DIAGNOSIS — J019 Acute sinusitis, unspecified: Secondary | ICD-10-CM | POA: Diagnosis not present

## 2021-12-19 DIAGNOSIS — Z23 Encounter for immunization: Secondary | ICD-10-CM | POA: Diagnosis not present

## 2021-12-19 MED ORDER — AMOXICILLIN-POT CLAVULANATE 875-125 MG PO TABS: 1.0000 | ORAL_TABLET | Freq: Two times a day (BID) | ORAL | 0 refills | Status: DC

## 2021-12-19 NOTE — Progress Notes (Signed)
Chief Complaint  Patient presents with   Consult    Tooth extraction and bone graft for implant Aug 1st. And ever since then she has had some right sided facial pain and pressure. She wonders if she could have sinus infection. Has been sneezing due to a tickling sensation.    Currently she states the entire top of her head hurts . Hurts to touch--scalp, whole head--started out just on the right side.  She also has pressure above the brow, and on either side of the nose.  She has nasal congestion/stuffiness.  Nasal mucus is clear.  She once had a glob of green, none since. Denies sore throat, hoarse, PND, cough. Denies ear plugging or pain (sometimes it will pop with swallowing). She gets a tickle in her R nose periodically, causing some strong sneezes. This was more frequent earlier on, improving.  Still occurred once yesterday.  She tried sinus rinses occasionally, which helps some. She takes claritin daily.  She started with pain at R cheek (which she thinks is related to the graft, started a few weeks afterwards), pain spread up to the nose, forehead and brow, to the top of the head. Since then it has spread to the left side as well. She had the surgery early August.  She went back to surgeon about 3 weeks later (later in August) with fever and pain. Wasn't given additional antibiotics at that time. Fever resolved after about a week or so. She had tried some leftover gabapentin a couple of weeks ago (just one pill), didn't help. She has had bad smell and taste. She reports that she had a piece of bone come out from the area that was worked on yesterday, has had some drainage, and recurrent bad taste (had improved until the piece of bone came out).  She has been taking tylenol extra strength tylenol twice a day for this pain. Stopped when she had colonoscopy in September.      PMH, PSH, SH reviewed  Outpatient Encounter Medications as of 12/19/2021  Medication Sig Note   calcium  citrate-vitamin D (CITRACAL+D) 315-200 MG-UNIT tablet Take 1 tablet by mouth at bedtime.    denosumab (PROLIA) 60 MG/ML SOLN injection Inject 60 mg into the skin every 6 (six) months. Administer in upper arm, thigh, or abdomen    dorzolamide (TRUSOPT) 2 % ophthalmic solution Place 1 drop into both eyes 3 (three) times daily.    ELIQUIS 5 MG TABS tablet TAKE 1 TABLET BY MOUTH TWICE A DAY    loratadine (CLARITIN) 10 MG tablet Take 10 mg by mouth daily.    LUTEIN PO Take 1 capsule by mouth at bedtime.    Multiple Vitamins-Minerals (CENTRUM SILVER PO) Take 1 tablet by mouth daily.    Netarsudil-Latanoprost (ROCKLATAN) 0.02-0.005 % SOLN Place 1 drop into both eyes at bedtime.    Olopatadine HCl (PATADAY OP) Apply 1 drop to eye as directed.    pilocarpine (PILOCAR) 4 % ophthalmic solution Place 1 drop into both eyes 3 (three) times daily.    rosuvastatin (CRESTOR) 20 MG tablet TAKE 1 TABLET (20 MG TOTAL) BY MOUTH WEEKLY    acetaminophen (TYLENOL) 500 MG tablet Take 2 tablets (1,000 mg total) by mouth every 8 (eight) hours as needed. (Patient not taking: Reported on 12/19/2021) 05/30/2021: As needed   diltiazem (CARDIZEM) 30 MG tablet Take 30 mg by mouth as needed (a-fib). (Patient not taking: Reported on 12/19/2021) 11/04/2020: Prn has not taken recently   No facility-administered encounter medications  on file as of 12/19/2021.   Allergies  Allergen Reactions   Brinzolamide-Brimonidine Itching   Cortisone Other (See Comments)    Entire body hurt from a cortisone shot.   Fosamax [Alendronate Sodium]     GI upset, bone pain; tolerated Actonel   Timolol Other (See Comments)    Slowed her heart rate down   Tramadol Rash   ROS:  no fever since early September. Headaches and congestion per HPI. No cough, shortness of breath, no chest pain. No n/v/d. No chest pain, palpitations, swelling, or other complaints, except as noted in HPI.    PHYSICAL EXAM:  BP (!) 160/80   Temp 98.6 F (37 C)  (Tympanic)   Ht 5' 6.5" (1.689 m)   Wt 134 lb (60.8 kg)   LMP 03/20/2000   BMI 21.30 kg/m   150/72 on repeat by MD Repeat by nurse at end of visit was 136/78  Excitable female, in no distress HEENT: conjunctiva and sclera are clear, EOMI.  Nasal mucosa is normal, no erythema or purulence. TM's and EAC's normal. OP--normal OP.  There is a hole where tooth was extracted at R upper jaw. No surrounding erythema or swelling, no active drainage. Tender R>L maxillary sinuses, tender at R frontal sinus.  Nontender at temporalis muscles. Neck: no lymphadenopathy Heart: regular rate and rhythm (rate around 60). Lungs: clear bilaterally Skin: no bruising, normal turgor Psych: excitable, slightly anxious, normal mood, hygiene, grooming Neuro: alert and oriented, cranial nerves intact, normal gait.   ASSESSMENT/PLAN:  Subacute sinusitis, unspecified location - poss related to dental surgery. Cover for infection with Augmentin. Encouraged sinus rinses, Mucinex. If not better, may need CT or ENT referral - Plan: amoxicillin-clavulanate (AUGMENTIN) 875-125 MG tablet  Need for influenza vaccination - Plan: Flu Vaccine QUAD High Dose(Fluad)  Please drink plenty of water. Take the antibiotic twice daily for 10 days. Contact us if your symptoms aren't any better. (? Need for CT or ENT evaluation?) Resume doing regular sinus rinses (be sure to use boiled or distilled water, not tap water). Do this once or twice daily. Consider using guaifenesin (mucinex) to see if that helps. Continue the claritin.

## 2021-12-19 NOTE — Patient Instructions (Signed)
  Please drink plenty of water. Take the antibiotic twice daily for 10 days. Contact us if your symptoms aren't any better. (? Need for CT or ENT evaluation?) Resume doing regular sinus rinses (be sure to use boiled or distilled water, not tap water). Do this once or twice daily. Consider using guaifenesin (mucinex) to see if that helps. Continue the claritin.

## 2021-12-27 ENCOUNTER — Encounter: Payer: Self-pay | Admitting: Internal Medicine

## 2022-01-18 ENCOUNTER — Encounter: Payer: Self-pay | Admitting: Family Medicine

## 2022-01-18 ENCOUNTER — Telehealth: Payer: Self-pay | Admitting: Family Medicine

## 2022-01-18 DIAGNOSIS — J329 Chronic sinusitis, unspecified: Secondary | ICD-10-CM

## 2022-01-18 NOTE — Telephone Encounter (Signed)
Pt called and states that she called the ent you recommended and they are requesting a referral. She states it is [facility] [address] [place] Phone number is [phone] and fax is [phone].

## 2022-01-18 NOTE — Telephone Encounter (Signed)
referred

## 2022-02-02 DIAGNOSIS — J320 Chronic maxillary sinusitis: Secondary | ICD-10-CM | POA: Diagnosis not present

## 2022-02-07 DIAGNOSIS — H401133 Primary open-angle glaucoma, bilateral, severe stage: Secondary | ICD-10-CM | POA: Diagnosis not present

## 2022-02-16 ENCOUNTER — Telehealth: Payer: Self-pay | Admitting: Family Medicine

## 2022-02-16 NOTE — Telephone Encounter (Signed)
Pt called concerning Prolia. Pt's estimated cost is $0. PT advised to call as leaving so medication can be set out. Medication order from physician services.

## 2022-02-22 ENCOUNTER — Other Ambulatory Visit (INDEPENDENT_AMBULATORY_CARE_PROVIDER_SITE_OTHER): Payer: Medicare PPO

## 2022-02-22 DIAGNOSIS — M810 Age-related osteoporosis without current pathological fracture: Secondary | ICD-10-CM | POA: Diagnosis not present

## 2022-02-22 MED ORDER — DENOSUMAB 60 MG/ML ~~LOC~~ SOSY
60.0000 mg | PREFILLED_SYRINGE | Freq: Once | SUBCUTANEOUS | Status: AC
  Administered 2022-02-22: 60 mg via SUBCUTANEOUS

## 2022-03-03 DIAGNOSIS — J320 Chronic maxillary sinusitis: Secondary | ICD-10-CM | POA: Diagnosis not present

## 2022-03-03 DIAGNOSIS — Z7901 Long term (current) use of anticoagulants: Secondary | ICD-10-CM | POA: Diagnosis not present

## 2022-03-03 DIAGNOSIS — R519 Headache, unspecified: Secondary | ICD-10-CM | POA: Diagnosis not present

## 2022-03-03 DIAGNOSIS — J309 Allergic rhinitis, unspecified: Secondary | ICD-10-CM | POA: Diagnosis not present

## 2022-05-03 ENCOUNTER — Other Ambulatory Visit: Payer: Self-pay | Admitting: Family Medicine

## 2022-05-03 DIAGNOSIS — E7800 Pure hypercholesterolemia, unspecified: Secondary | ICD-10-CM

## 2022-05-03 DIAGNOSIS — I700 Atherosclerosis of aorta: Secondary | ICD-10-CM

## 2022-05-04 NOTE — Telephone Encounter (Signed)
Paige Klein called and said she missed a call from you. She says she does need the refill for rosuvastatin to [id number] [place], [place] - [address]

## 2022-05-11 DIAGNOSIS — H401133 Primary open-angle glaucoma, bilateral, severe stage: Secondary | ICD-10-CM | POA: Diagnosis not present

## 2022-06-02 NOTE — Progress Notes (Unsigned)
No chief complaint on file.   Paige Klein is a 72 y.o. female who presents for annual physical exam, Medicare AWV and f/u on chronic problems.  Hypercholesterolemia and aortic atherosclerosis (on CT 10/2020):  This had been diet-controlled. She was started once weekly Crestor after her physical last year, due to atherosclerosis.  Lipids on recheck were improved, though LDL still >100.  Due for recheck. She is tolerating once weekly rosuvastatin without side effects.  She continues to follow a low cholesterol diet. She eats eggs in spurts; tends to eat more chicken and pork than beef (just once a week). No creamy things, mayonnaise. Uses olive oil.  Component Ref Range & Units 9 mo ago 1 yr ago 2 yr ago 3 yr ago 4 yr ago 7 yr ago 8 yr ago  Cholesterol, Total 100 - 199 mg/dL 185 231 High  198 232 High  244 High     Triglycerides 0 - 149 mg/dL 101 103 99 114 88 84 R 76 R  HDL >39 mg/dL 56 70 51 73 70 55 R 55  VLDL Cholesterol Cal 5 - 40 mg/dL 18 18 18 20 18     LDL Chol Calc (NIH) 0 - 99 mg/dL 111 High  143 High  129 High  139 High      Chol/HDL Ratio 0.0 - 4.4 ratio 3.3 3.3 CM 3.9 CM 3.2 CM 3.5 CM 3.0 R 3.4 R   Osteoporosis:  She continues on Prolia q6 months without side effects. This was started in 05/2015.  She is s/p kyphoplasty 04/2015. She previously took Actonel x 6 years, changed to Fosamax (insurance reasons), stopped in 11/2011.  Last DEXA was in 09/2020, T-2.1 at left femoral neck. Significant increase in bone density noted at L hip, stable at spine and R hip.   Paroxysmal atrial fibrillation: Diagnosed in 2019. She is compliant with Eliquis, and is under the care of Dr. Curt Bears. She last saw him for virtual visit in 10/2020, 3 month f/u was recommended. She doesn't have any f/u scheduled. She had been holding off on scheduling an appointment until she felt like she was well enough for ablation, because she thinks he is going to push this. She uses diltiazem just prn,  usually if heart is racing when she is trying to go to sleep.  Hasn't used any in months. There is no associated chest pain or dyspnea.  She does note feeling more lightheaded than she used to (sometimes with exercise, only recently also noticed some just sitting), in the last month.  She doesn't notice her heart racing at that time. She denies any bleeding.    UPDATE CARDIOLOGY, AND RECENT VISITS, OPHTHO, ENT, ETC  Glaucoma--She no longer drives at night.  She sees better outdoors than indoors. 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 "It is like looking through fog all the time" in both eyes.   Immunization History  Administered Date(s) Administered   Fluad Quad(high Dose 65+) 12/27/2018, 05/19/2020, 12/19/2021   Influenza Split 03/21/2007, 03/20/2008, 01/19/2011   Influenza, High Dose Seasonal PF 03/04/2015, 03/09/2016, 01/15/2017, 01/17/2018   Influenza, Seasonal, Injecte, Preservative Fre 02/21/2012   Influenza,inj,Quad PF,6+ Mos 02/24/2013, 02/25/2014   PFIZER(Purple Top)SARS-COV-2 Vaccination 06/09/2019, 06/30/2019   PPD Test 02/11/1997   Pneumococcal Conjugate-13 03/04/2015   Pneumococcal Polysaccharide-23 03/09/2016   Td 10/04/1990, 03/18/2001   Tdap 04/13/2009   Zoster Recombinat (Shingrix) 04/08/2021   Zoster, Live 08/20/2012   Declines flu  and COVID booster (didn't get flu shot this year) Last Pap smear: 03/2018--normal with no high risk HPV Last mammogram: 09/2020 at [facility] Last colonoscopy: 07/2011 Dr. Collene Mares; hyperplastic polyps (10 yr f/u recommended) Last DEXA:  09/2020 T-2.1 L fem neck (Prolia started 05/2015) Ophtho: every 3-4 months (glaucoma) Dentist: regularly, every 6 months Exercise:  Less regular since completing PT.  Still does some home exercises. Going to the [facility] sporadically (not every week), bike/treadmill; only recently added some weights (limited due to her L shoulder discomfort recently).  Vitamin D  level was normal twice in the past on current vitamins (last was 34 in 02/2014).   Patient Care Team: Rita Ohara, MD as PCP - General (Family Medicine) Constance Haw, MD as PCP - Cardiology (Cardiology) Constance Haw, MD as PCP - Electrophysiology (Cardiology) GI: Dr. Collene Mares Ophtho: Dr. Delman Cheadle (no longer sees) Eye specialist at [facility]: Dr. Edilia Bo Dentist: Dr. Johnnye Sima GYN: Dr. Carren Rang (retired) Derm: Dr. Delman Cheadle  Depression Screening: Flowsheet Row Office Visit from 05/30/2021 in [place]  PHQ-2 Total Score 0        Falls screen:     05/30/2021    9:46 AM 04/05/2020    8:22 AM 03/27/2019    9:50 AM 03/25/2018    9:30 AM 03/25/2018    9:07 AM  [place] in the past year? 0 0 0    Comment does not think so      Number falls in past yr: 0    1  Injury with Fall? 0    0  Risk for fall due to : No Fall Risks   Impaired vision   Follow up Falls evaluation completed   Education provided      Functional Status Survey:          End of Life Discussion:  Patient has a living will and medical power of attorney  PMH, PSH, [place] and FH reviewed and updated.     ROS: The patient denies anorexia, fever, weight changes, headaches, decreased hearing (just mild), ear pain, sore throat, breast concerns, chest pain, dizziness, syncope, dyspnea on exertion, swelling, nausea, vomiting, abdominal pain, melena, hematochezia, indigestion/heartburn, hematuria,dysuria, no postmenopausal bleeding, vaginal discharge, odor or itch, genital lesions, numbness, tingling, weakness, tremor, suspicious skin lesions, depression, anxiety.   Some urge urinary urgency, only rare leakage Chronic runny nose and eyes--somewhat improved since using claritin or zyrtec. (Eyes are still watery though) +vision loss due to glaucoma per HPI  Has chronic hyperpigmentation on the lower legs, L>R  Some pain in L shoulder (since her injury/accident); home exercises help some. She thinks she pulled  something within the last few weeks.  She has some decreased ROM reaching back only. Some dizziness in the last month, per HPI. No chest pain or shortness of breath.  Periodic tachycardia/palpitations at night.    PHYSICAL EXAM:  LMP 03/20/2000   Wt Readings from Last 3 Encounters:  12/19/21 134 lb (60.8 kg)  06/09/21 143 lb (64.9 kg)  05/30/21 139 lb 12.8 oz (63.4 kg)    General Appearance:    Alert, cooperative, no distress, appears her age    Head:    Normocephalic, without obvious abnormality, atraumatic     Eyes:    PERRL, EOMI, fundi not well visualized; conjunctiva is moderately injected bilaterally, R>L.   Ears:    Normal TM's and external ear canals     Nose:    Not examined, wearing mask due to COVID-19  pandemic   Throat:    Not examined, wearing mask due to COVID-19 pandemic  Neck:    Supple, no lymphadenopathy; thyroid: no enlargement/tenderness/nodules; no carotid bruit or JVD     Back:    Spine nontender, no curvature, ROM normal, no CVA tenderness     Lungs:    Clear to auscultation bilaterally without wheezes, rales or rhonchi; respirations unlabored     Chest Wall:    No tenderness or deformity     Heart:    Bradycardic (50's), regular rhythm;  S1 and S2 normal, no murmur, rub or gallop.     Breast Exam:    No tenderness, masses, or nipple discharge or inversion. No axillary lymphadenopathy     Abdomen:    Soft, non-tender, nondistended, normoactive bowel sounds, no masses, no hepatosplenomegaly     Genitalia:    Normal external genitalia without lesions. + atrophic changes. No cervical motion tenderness. Uterus and adnexa not enlarged, nontender, no masses. Pap not performed     Rectal:    Normal tone, no masses or tenderness; guaiac negative stool     Extremities:    No clubbing, cyanosis or edema. WHSS at L knee. +warmth of L knee, mild effusion.  Minimal focal tenderness  Pulses:    2+ dorsalis pedis and PT pulses  Skin:    Skin color, texture, turgor normal.  Hyperpigmentation at the L lower leg, medially, and extending across anteriorly.  Only a couple of very small hyperpigmented areas on the RLE.  There is a small, nontender subcutaneous nodule at the right anterior lower neck (0.5 x 1cm). Some bruises noted at her knees  Lymph nodes:    Cervical, supraclavicular, inguinal and axillary nodes normal     Neurologic:    Normal strength, sensation and gait; reflexes 2+ and symmetric throughout                         Psych:     Normal mood, affect, hygiene and grooming    ASSESSMENT/PLAN:  Did she get 2nd dose of shingrix? Did she get Tdap from pharmacy? Did she get RSV vaccine? COVID booster?  Decline COVID booster if refuses (has in past) Prevnar-20 recommended today  DEXA due in July/August, can schedule to be done with her mammo in August ([facility]--they need to send [place] order to sign).   Past due to see Dr. Lennie Odor (3 mo f/u rec in 10/2020), should be running out of her meds! Last filled eliquis x 1 yr in 04/2020. Pt states she has another month left at home  Cbc, c-met, lipids  Discussed monthly self breast exams and yearly mammograms; at least 30 minutes of aerobic activity at least 5 days/week, weight-bearing exercise 2x/week; proper sunscreen use reviewed; healthy diet, including goals of calcium and vitamin D intake and alcohol recommendations (less than or equal to 1 drink/day) reviewed; regular seatbelt use; changing batteries in smoke detectors.  Immunization recommendations discussed--continue yearly high dose flu shots. Shingrix--reminded to get second dose from the pharmacy Tdap recommended from pharmacy (same day or 2 weeks apart) COVID booster--strongly encouraged, declined today Colonoscopy recommendations reviewed--UTD, due again 07/2021    Full Code, Full Care MOST form reviewed, unchanged.   F/u in 1 year for CPE/AWV/med check, sooner prn. Will need labs sooner once started on statin    Medicare Attestation I have  personally reviewed: The patient's medical and social history Their use of alcohol, tobacco or illicit drugs Their  current medications and supplements The patient's functional ability including ADLs,fall risks, home safety risks, cognitive, and hearing and visual impairment Diet and physical activities Evidence for depression or mood disorders   The patient's weight, height and BMI have been recorded in the chart.  I have made referrals, counseling, and provided education to the patient based on review of the above and I have provided the patient with a written personalized care plan for preventive services.      Vikki Ports, MD

## 2022-06-04 NOTE — Patient Instructions (Incomplete)
HEALTH MAINTENANCE RECOMMENDATIONS:  It is recommended that you get at least 30 minutes of aerobic exercise at least 5 days/week (for weight loss, you may need as much as 60-90 minutes). This can be any activity that gets your heart rate up. This can be divided in 10-15 minute intervals if needed, but try and build up your endurance at least once a week.  Weight bearing exercise is also recommended twice weekly.  Eat a healthy diet with lots of vegetables, fruits and fiber.  "Colorful" foods have a lot of vitamins (ie green vegetables, tomatoes, red peppers, etc).  Limit sweet tea, regular sodas and alcoholic beverages, all of which has a lot of calories and sugar.  Up to 1 alcoholic drink daily may be beneficial for women (unless trying to lose weight, watch sugars).  Drink a lot of water.  Calcium recommendations are 1200-1500 mg daily (1500 mg for postmenopausal women or women without ovaries), and vitamin D 1000 IU daily.  This should be obtained from diet and/or supplements (vitamins), and calcium should not be taken all at once, but in divided doses.  Monthly self breast exams and yearly mammograms for women over the age of 34 is recommended.  Sunscreen of at least SPF 30 should be used on all sun-exposed parts of the skin when outside between the hours of 10 am and 4 pm (not just when at beach or pool, but even with exercise, golf, tennis, and yard work!)  Use a sunscreen that says "broad spectrum" so it covers both UVA and UVB rays, and make sure to reapply every 1-2 hours.  Remember to change the batteries in your smoke detectors when changing your clock times in the spring and fall. Carbon monoxide detectors are recommended for your home.  Use your seat belt every time you are in a car, and please drive safely and not be distracted with cell phones and texting while driving.   Paige Klein , Thank you for taking time to come for your Medicare Wellness Visit. I appreciate your ongoing  commitment to your health goals. Please review the following plan we discussed and let me know if I can assist you in the future.   This is a list of the screening recommended for you and due dates:  Health Maintenance  Topic Date Due   DTaP/Tdap/Td vaccine (4 - Td or Tdap) 04/14/2019   Zoster (Shingles) Vaccine (2 of 2) 06/03/2021   COVID-19 Vaccine (3 - 2023-24 season) 11/18/2021   Mammogram  10/20/2022   Medicare Annual Wellness Visit  06/05/2023   Colon Cancer Screening  11/26/2031   Pneumonia Vaccine  Completed   Flu Shot  Completed   DEXA scan (bone density measurement)  Completed   Hepatitis C Screening: USPSTF Recommendation to screen - Ages 86-79 yo.  Completed   HPV Vaccine  Aged Out   You are due for another bone density test this July/August.  Please schedule this when you schedule your mammogram ([facility] should send the order over for [place] to sign).  I recommend getting the RSV vaccine in the Fall, when you get your flu shot.  COVID booster is recommended  TdaP (tetanus booster) is overdue--you need to get this from the pharmacy. Wait 2 weeks from today's pneumonia vaccine to get this.  If your LDL cholesterol is less than 100, we will continue your rosuvastatin once weekly.  If it is over 100, we recommend increasing it to 2x/week. We will let you know through [place] and  send in refill tomorrow.

## 2022-06-05 ENCOUNTER — Ambulatory Visit: Payer: Medicare PPO | Admitting: Family Medicine

## 2022-06-05 ENCOUNTER — Encounter: Payer: Self-pay | Admitting: Family Medicine

## 2022-06-05 VITALS — BP 118/60 | HR 52 | Ht 67.0 in | Wt 137.4 lb

## 2022-06-05 DIAGNOSIS — Z0000 Encounter for general adult medical examination without abnormal findings: Secondary | ICD-10-CM

## 2022-06-05 DIAGNOSIS — L608 Other nail disorders: Secondary | ICD-10-CM

## 2022-06-05 DIAGNOSIS — I480 Paroxysmal atrial fibrillation: Secondary | ICD-10-CM | POA: Diagnosis not present

## 2022-06-05 DIAGNOSIS — Z5181 Encounter for therapeutic drug level monitoring: Secondary | ICD-10-CM

## 2022-06-05 DIAGNOSIS — E7800 Pure hypercholesterolemia, unspecified: Secondary | ICD-10-CM

## 2022-06-05 DIAGNOSIS — H401133 Primary open-angle glaucoma, bilateral, severe stage: Secondary | ICD-10-CM

## 2022-06-05 DIAGNOSIS — M810 Age-related osteoporosis without current pathological fracture: Secondary | ICD-10-CM

## 2022-06-05 DIAGNOSIS — I700 Atherosclerosis of aorta: Secondary | ICD-10-CM | POA: Diagnosis not present

## 2022-06-05 DIAGNOSIS — D6869 Other thrombophilia: Secondary | ICD-10-CM

## 2022-06-05 DIAGNOSIS — Z7901 Long term (current) use of anticoagulants: Secondary | ICD-10-CM

## 2022-06-05 DIAGNOSIS — Z23 Encounter for immunization: Secondary | ICD-10-CM

## 2022-06-05 LAB — LIPID PANEL

## 2022-06-06 LAB — LIPID PANEL
Chol/HDL Ratio: 2.6 ratio (ref 0.0–4.4)
Cholesterol, Total: 174 mg/dL (ref 100–199)
HDL: 68 mg/dL (ref >39)
LDL Chol Calc (NIH): 90 mg/dL (ref 0–99)
Triglycerides: 88 mg/dL (ref 0–149)
VLDL Cholesterol Cal: 16 mg/dL (ref 5–40)

## 2022-06-06 LAB — CBC WITH DIFFERENTIAL/PLATELET
Basophils Absolute: 0.1 10*3/uL (ref 0.0–0.2)
Basos: 1 %
EOS (ABSOLUTE): 0.1 10*3/uL (ref 0.0–0.4)
Eos: 1 %
Hematocrit: 39.6 % (ref 34.0–46.6)
Hemoglobin: 13.1 g/dL (ref 11.1–15.9)
Immature Grans (Abs): 0 10*3/uL (ref 0.0–0.1)
Immature Granulocytes: 0 %
Lymphocytes Absolute: 1.4 10*3/uL (ref 0.7–3.1)
Lymphs: 24 %
MCH: 30.8 pg (ref 26.6–33.0)
MCHC: 33.1 g/dL (ref 31.5–35.7)
MCV: 93 fL (ref 79–97)
Monocytes Absolute: 0.5 10*3/uL (ref 0.1–0.9)
Monocytes: 9 %
Neutrophils Absolute: 3.7 10*3/uL (ref 1.4–7.0)
Neutrophils: 65 %
Platelets: 283 10*3/uL (ref 150–450)
RBC: 4.25 x10E6/uL (ref 3.77–5.28)
RDW: 12.3 % (ref 11.7–15.4)
WBC: 5.8 10*3/uL (ref 3.4–10.8)

## 2022-06-06 LAB — COMPREHENSIVE METABOLIC PANEL
ALT: 17 IU/L (ref 0–32)
AST: 21 IU/L (ref 0–40)
Albumin/Globulin Ratio: 1.6 (ref 1.2–2.2)
Albumin: 4.4 g/dL (ref 3.8–4.8)
Alkaline Phosphatase: 54 IU/L (ref 44–121)
BUN/Creatinine Ratio: 20 (ref 12–28)
BUN: 19 mg/dL (ref 8–27)
Bilirubin Total: 0.3 mg/dL (ref 0.0–1.2)
CO2: 22 mmol/L (ref 20–29)
Calcium: 9.5 mg/dL (ref 8.7–10.3)
Chloride: 103 mmol/L (ref 96–106)
Creatinine, Ser: 0.93 mg/dL (ref 0.57–1.00)
Globulin, Total: 2.8 g/dL (ref 1.5–4.5)
Glucose: 85 mg/dL (ref 70–99)
Potassium: 4.3 mmol/L (ref 3.5–5.2)
Sodium: 140 mmol/L (ref 134–144)
Total Protein: 7.2 g/dL (ref 6.0–8.5)
eGFR: 65 mL/min/{1.73_m2} (ref >59)

## 2022-06-14 ENCOUNTER — Other Ambulatory Visit: Payer: Self-pay | Admitting: Cardiology

## 2022-06-14 NOTE — Telephone Encounter (Signed)
Pt last saw Melina Copa, PA on 11/07/21, last labs 06/05/22 Creat 0.93, age 72, weight 62.3kg, based on specified criteria pt is on appropriate dosage of Eliquis 5mg  BID for afib.  Will refill rx.  Pt is due for follow-up with Dr Curt Bears msg sent to schedulers to contact pt for f/u.

## 2022-06-20 NOTE — Progress Notes (Unsigned)
  Electrophysiology Office Note:   Date:  06/20/2022  ID:  Paige Klein, DOB [birth year], MRN [id number]  Primary Cardiologist: Will Meredith Leeds, MD Electrophysiologist: Constance Haw, MD   History of Present Illness:   Paige Klein is a 72 y.o. female with h/o *** seen today for {VISITTYPE:28148}  Review of systems complete and found to be negative unless listed in HPI.   Studies Reviewed:    [id number] is not ordered today"}  ***  Risk Assessment/Calculations:   {Does this patient have ATRIAL FIBRILLATION?:[id number]} No BP recorded.  {Refresh Note OR Click here to enter BP  :1}***        Physical Exam:   VS:  LMP 03/20/2000    Wt Readings from Last 3 Encounters:  06/05/22 137 lb 6.4 oz (62.3 kg)  12/19/21 134 lb (60.8 kg)  06/09/21 143 lb (64.9 kg)     GEN: Well nourished, well developed in no acute distress NECK: No JVD; No carotid bruits CARDIAC: {EPRHYTHM:28826}, no murmurs, rubs, gallops RESPIRATORY:  Clear to auscultation without rales, wheezing or rhonchi  ABDOMEN: Soft, non-tender, non-distended EXTREMITIES:  No edema; No deformity   ASSESSMENT AND PLAN:   ***  {Click here to Review PMH, Prob List, Meds, Allergies, SHx, FHx  :1}   Follow up with [id number] {EPFOLLOW UP:28173}  Signed, Shirley Friar, PA-C  Electrophysiology Office Note:   Date:  06/20/2022  ID:  Paige Klein, DOB 19-Jun-1950, MRN [id number]  Primary Cardiologist: Will Meredith Leeds, MD Electrophysiologist: Will Meredith Leeds, MD   History of Present Illness:   Paige Klein is a 72 y.o. female with h/o AF with RVR seen today for routine electrophysiology followup. Since last being seen in our clinic the patient reports doing ***.  she denies chest pain, palpitations, dyspnea, PND, orthopnea, nausea, vomiting, dizziness, syncope, edema, weight gain, or early satiety.   Review of systems complete and found to be negative unless  listed in HPI.   Studies Reviewed:    EKG is ordered today. Personal review shows ***  Risk Assessment/Calculations:    CHA2DS2-VASc Score = 2  {   No BP recorded.  {Refresh Note OR Click here to enter BP  :1}***        Physical Exam:   VS:  LMP 03/20/2000    Wt Readings from Last 3 Encounters:  06/05/22 137 lb 6.4 oz (62.3 kg)  12/19/21 134 lb (60.8 kg)  06/09/21 143 lb (64.9 kg)     GEN: Well nourished, well developed in no acute distress NECK: No JVD; No carotid bruits CARDIAC: {EPRHYTHM:28826}, no murmurs, rubs, gallops RESPIRATORY:  Clear to auscultation without rales, wheezing or rhonchi  ABDOMEN: Soft, non-tender, non-distended EXTREMITIES:  No edema; No deformity   ASSESSMENT AND PLAN:    Paroxysmal atrial fibrillation Continue eliquis for CHA2DS2VASc  of at least 2 Continue diltiazem prn Occasional breakthrough. Heave previously discussed ablation which she would like to continue to defer ***  {Click here to Review PMH, Prob List, Meds, Allergies, SHx, FHx  :1}   Follow up with [id number] {EPFOLLOW [id number]  Signed, Shirley Friar, PA-C

## 2022-06-21 ENCOUNTER — Ambulatory Visit: Payer: Medicare PPO | Attending: Student | Admitting: Student

## 2022-06-21 ENCOUNTER — Encounter: Payer: Self-pay | Admitting: Student

## 2022-06-21 VITALS — BP 100/58 | HR 69 | Ht 67.0 in | Wt 134.0 lb

## 2022-06-21 DIAGNOSIS — I491 Atrial premature depolarization: Secondary | ICD-10-CM

## 2022-06-21 DIAGNOSIS — I480 Paroxysmal atrial fibrillation: Secondary | ICD-10-CM | POA: Diagnosis not present

## 2022-06-21 NOTE — Patient Instructions (Signed)
Medication Instructions:  Your physician recommends that you continue on your current medications as directed. Please refer to the Current Medication list given to you today.  *If you need a refill on your cardiac medications before your next appointment, please call your pharmacy*   Lab Work: None If you have labs (blood work) drawn today and your tests are completely normal, you will receive your results only by: MyChart Message (if you have MyChart) OR A paper copy in the mail If you have any lab test that is abnormal or we need to change your treatment, we will call you to review the results.   Follow-Up: At [facility], you and your health needs are our priority.  As part of our continuing mission to provide you with exceptional heart care, we have created designated Provider Care Teams.  These Care Teams include your primary Cardiologist (physician) and Advanced Practice Providers (APPs -  Physician Assistants and Nurse Practitioners) who all work together to provide you with the care you need, when you need it.   Your next appointment:   1 year(s)  Provider:   Will Camnitz, MD     

## 2022-07-24 DIAGNOSIS — H401133 Primary open-angle glaucoma, bilateral, severe stage: Secondary | ICD-10-CM | POA: Diagnosis not present

## 2022-07-26 ENCOUNTER — Other Ambulatory Visit: Payer: Self-pay | Admitting: Family Medicine

## 2022-07-26 DIAGNOSIS — I700 Atherosclerosis of aorta: Secondary | ICD-10-CM

## 2022-07-26 DIAGNOSIS — E7800 Pure hypercholesterolemia, unspecified: Secondary | ICD-10-CM

## 2022-08-29 ENCOUNTER — Telehealth: Payer: Self-pay | Admitting: Family Medicine

## 2022-08-29 NOTE — Telephone Encounter (Signed)
Left message for pt to call concerning prolia. Pt needs to speak to [place] or [place]

## 2022-08-31 NOTE — Telephone Encounter (Signed)
Patient is scheduled on 09/13/22 for her Prolia. Estimated cost is 0. PA on file. PA [id number] valid 06/04/19 - 03/20/23

## 2022-09-05 DIAGNOSIS — J320 Chronic maxillary sinusitis: Secondary | ICD-10-CM | POA: Diagnosis not present

## 2022-09-13 ENCOUNTER — Other Ambulatory Visit (INDEPENDENT_AMBULATORY_CARE_PROVIDER_SITE_OTHER): Payer: Medicare PPO

## 2022-09-13 DIAGNOSIS — M810 Age-related osteoporosis without current pathological fracture: Secondary | ICD-10-CM

## 2022-09-13 MED ORDER — DENOSUMAB 60 MG/ML ~~LOC~~ SOSY
60.0000 mg | PREFILLED_SYRINGE | Freq: Once | SUBCUTANEOUS | Status: AC
  Administered 2022-09-13: 60 mg via SUBCUTANEOUS

## 2022-09-27 ENCOUNTER — Other Ambulatory Visit: Payer: Self-pay | Admitting: Family Medicine

## 2022-09-27 DIAGNOSIS — I700 Atherosclerosis of aorta: Secondary | ICD-10-CM

## 2022-09-27 DIAGNOSIS — E7800 Pure hypercholesterolemia, unspecified: Secondary | ICD-10-CM

## 2022-10-17 DIAGNOSIS — Z1231 Encounter for screening mammogram for malignant neoplasm of breast: Secondary | ICD-10-CM | POA: Diagnosis not present

## 2022-10-17 LAB — HM MAMMOGRAPHY

## 2022-10-19 ENCOUNTER — Encounter: Payer: Self-pay | Admitting: *Deleted

## 2022-10-27 DIAGNOSIS — M8588 Other specified disorders of bone density and structure, other site: Secondary | ICD-10-CM | POA: Diagnosis not present

## 2022-10-27 LAB — HM DEXA SCAN

## 2022-11-01 ENCOUNTER — Encounter: Payer: Self-pay | Admitting: Family Medicine

## 2022-11-06 ENCOUNTER — Ambulatory Visit: Payer: Medicare PPO | Admitting: Podiatry

## 2022-11-06 ENCOUNTER — Encounter: Payer: Self-pay | Admitting: Podiatry

## 2022-11-06 DIAGNOSIS — L603 Nail dystrophy: Secondary | ICD-10-CM | POA: Diagnosis not present

## 2022-11-06 DIAGNOSIS — L608 Other nail disorders: Secondary | ICD-10-CM | POA: Diagnosis not present
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
## 2022-11-27 DIAGNOSIS — H401133 Primary open-angle glaucoma, bilateral, severe stage: Secondary | ICD-10-CM | POA: Diagnosis not present

## 2022-12-04 ENCOUNTER — Ambulatory Visit: Payer: Medicare PPO | Admitting: Podiatry

## 2022-12-04 DIAGNOSIS — L603 Nail dystrophy: Secondary | ICD-10-CM | POA: Diagnosis not present

## 2022-12-04 NOTE — Progress Notes (Signed)
Subjective:  Patient ID: Neldon Mc, female    DOB: 1950-11-26,   MRN: [id number]  Chief Complaint  Patient presents with   Nail Problem    Pt presents for a fungal nail check up and culture results.     72 y.o. female presents for follow-up of toenails and to check culture results.  . Denies any other pedal complaints. Denies n/v/f/c.   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 Lottie Dawson   Glaucoma suspect of both eyes    high-normal intraocular pressures (Dr. Emily Filbert)   H/O bone density study 09/07/09   08/16/07, 08/08/05; osteoporosis   H/O mammogram 11/23/10   normal   Hypercholesteremia    borderline per NMR lipoprofile 2008, 2007   Internal hemorrhoids    Kidney stone 1986   right   Migraine headache    resolved with menopause (menstrual migraines)   MVP (mitral valve prolapse)    per echo   Osteoporosis    Osteoporosis 02/22/2012   Pap smear for cervical cancer screening 04/14/09   negative for malignancy, +atrophic changes   Postmenopausal    on compounded cream daily   Unspecified vitamin D deficiency 2009   Vitamin D deficiency 02/22/2012    Objective:  Physical Exam: Vascular: DP/PT pulses 2/4 bilateral. CFT <3 seconds. Normal hair growth on digits. No edema.  Skin. No lacerations or abrasions bilateral feet. Bilateral hallux nails are thickened and dystrophic with subungual debris and undelrying hemorrhage.  Musculoskeletal: MMT 5/5 bilateral lower extremities in DF, PF, Inversion and Eversion. Deceased ROM in DF of ankle joint.  Neurological: Sensation intact to light touch.   Assessment:   1. Onychodystrophy       Plan:  Patient was  evaluated and treated and all questions answered. -Examined patient -Discussed treatment options for painful dystrophic nails  -Cultures not final but called and currently no growth of fungus.  -Discussed treatment options including topicals like urea or vicks vapo rub.  -Patient to return as needed.    Louann Sjogren, DPM

## 2022-12-13 ENCOUNTER — Other Ambulatory Visit: Payer: Self-pay | Admitting: Cardiology

## 2022-12-13 DIAGNOSIS — I480 Paroxysmal atrial fibrillation: Secondary | ICD-10-CM

## 2022-12-14 NOTE — Telephone Encounter (Signed)
Prescription refill request for Eliquis received. Indication: Afib  Last office visit: 06/21/22 Lanna Poche)  Scr: 0.93 (06/05/22)  Age: 72 Weight: 60.8kg  Appropriate dose. Refill sent.

## 2023-01-10 ENCOUNTER — Other Ambulatory Visit: Payer: Self-pay | Admitting: Family Medicine

## 2023-01-10 DIAGNOSIS — I700 Atherosclerosis of aorta: Secondary | ICD-10-CM

## 2023-01-10 DIAGNOSIS — E7800 Pure hypercholesterolemia, unspecified: Secondary | ICD-10-CM

## 2023-01-19 ENCOUNTER — Other Ambulatory Visit: Payer: Self-pay | Admitting: Internal Medicine

## 2023-01-19 DIAGNOSIS — M810 Age-related osteoporosis without current pathological fracture: Secondary | ICD-10-CM

## 2023-01-19 MED ORDER — DENOSUMAB 60 MG/ML ~~LOC~~ SOSY
60.0000 mg | PREFILLED_SYRINGE | Freq: Once | SUBCUTANEOUS | Status: AC
  Administered 2023-03-16: 60 mg via SUBCUTANEOUS

## 2023-02-26 DIAGNOSIS — H409 Unspecified glaucoma: Secondary | ICD-10-CM | POA: Diagnosis not present

## 2023-02-26 DIAGNOSIS — Z79620 Long term (current) use of immunosuppressive biologic: Secondary | ICD-10-CM | POA: Diagnosis not present

## 2023-02-26 DIAGNOSIS — Z7901 Long term (current) use of anticoagulants: Secondary | ICD-10-CM | POA: Diagnosis not present

## 2023-02-26 DIAGNOSIS — J309 Allergic rhinitis, unspecified: Secondary | ICD-10-CM | POA: Diagnosis not present

## 2023-02-26 DIAGNOSIS — Z8616 Personal history of COVID-19: Secondary | ICD-10-CM | POA: Diagnosis not present

## 2023-02-26 DIAGNOSIS — I4891 Unspecified atrial fibrillation: Secondary | ICD-10-CM | POA: Diagnosis not present

## 2023-02-26 DIAGNOSIS — R030 Elevated blood-pressure reading, without diagnosis of hypertension: Secondary | ICD-10-CM | POA: Diagnosis not present

## 2023-02-26 DIAGNOSIS — E785 Hyperlipidemia, unspecified: Secondary | ICD-10-CM | POA: Diagnosis not present

## 2023-02-26 DIAGNOSIS — R001 Bradycardia, unspecified: Secondary | ICD-10-CM | POA: Diagnosis not present

## 2023-02-26 DIAGNOSIS — N182 Chronic kidney disease, stage 2 (mild): Secondary | ICD-10-CM | POA: Diagnosis not present

## 2023-02-26 DIAGNOSIS — M810 Age-related osteoporosis without current pathological fracture: Secondary | ICD-10-CM | POA: Diagnosis not present

## 2023-02-26 DIAGNOSIS — D6869 Other thrombophilia: Secondary | ICD-10-CM | POA: Diagnosis not present

## 2023-03-07 ENCOUNTER — Telehealth: Payer: Self-pay

## 2023-03-07 NOTE — Telephone Encounter (Signed)
Pt ready for scheduling for Prolia on or after : 03/16/23  Out-of-pocket cost due at time of visit: $40  Primary: [employer] - Medicare Prolia co-insurance: $40 Admin fee co-insurance: 0%  Secondary: N/A Prolia co-insurance:  Admin fee co-insurance:   Medical Benefit Details: Date Benefits were checked: 01/16/23 Deductible: no/ Coinsurance: $40/ Admin Fee: 0%  Prior Auth: Approved PA# [id number] Expiration Date: 06/04/19 to 03/19/24  # of doses approved:  Pharmacy benefit: Copay $-- If patient wants fill through the pharmacy benefit please send prescription to:  -- , and include estimated need by date in rx notes. Pharmacy will ship medication directly to the office.  Patient not eligible for Prolia Copay Card. Copay Card can make patient's cost as little as $25. Link to apply: [email or web address]://[email or web address]  ** This summary of benefits is an estimation of the patient's out-of-pocket cost. Exact cost may very based on individual plan coverage.

## 2023-03-16 ENCOUNTER — Other Ambulatory Visit (INDEPENDENT_AMBULATORY_CARE_PROVIDER_SITE_OTHER): Payer: Medicare PPO

## 2023-03-16 DIAGNOSIS — M810 Age-related osteoporosis without current pathological fracture: Secondary | ICD-10-CM

## 2023-04-28 DIAGNOSIS — R0600 Dyspnea, unspecified: Secondary | ICD-10-CM | POA: Diagnosis not present

## 2023-04-28 DIAGNOSIS — R051 Acute cough: Secondary | ICD-10-CM | POA: Diagnosis not present

## 2023-05-16 DIAGNOSIS — H401133 Primary open-angle glaucoma, bilateral, severe stage: Secondary | ICD-10-CM | POA: Diagnosis not present

## 2023-06-27 ENCOUNTER — Ambulatory Visit: Payer: Medicare PPO | Admitting: Cardiology

## 2023-07-10 ENCOUNTER — Encounter: Payer: Self-pay | Admitting: Cardiology

## 2023-07-10 ENCOUNTER — Ambulatory Visit: Attending: Cardiology | Admitting: Cardiology

## 2023-07-10 VITALS — BP 150/62 | HR 49 | Ht 67.0 in | Wt 133.0 lb

## 2023-07-10 DIAGNOSIS — I491 Atrial premature depolarization: Secondary | ICD-10-CM

## 2023-07-10 DIAGNOSIS — I480 Paroxysmal atrial fibrillation: Secondary | ICD-10-CM

## 2023-07-10 NOTE — Progress Notes (Signed)
  Electrophysiology Office Note:   Date:  07/10/2023  ID:  Paige Klein, DOB September 14, 1950, MRN [id number]  Primary Cardiologist: Iyari Hagner [place] Ding, MD Primary Heart Failure: None Electrophysiologist: Zya Finkle [place] Ding, MD      History of Present Illness:   Paige Klein is a 73 y.o. female with h/o atrial fibrillation, PACs seen today for routine electrophysiology followup.   Since last being seen in our clinic the patient reports doing overall well.  She has noted an increase in her palpitations.  She cannot say if they are happening a few times a week or a few times a month, though they do appear to be rare.  She has taken her diltiazem  once or twice which has improved her symptoms.  She has also tried vagal maneuvers with improvement..  she denies chest pain, palpitations, dyspnea, PND, orthopnea, nausea, vomiting, dizziness, syncope, edema, weight gain, or early satiety.   Review of systems complete and found to be negative unless listed in HPI.   EP Information / Studies Reviewed:    EKG is ordered today. Personal review as below.        Risk Assessment/Calculations:    CHA2DS2-VASc Score =     This indicates a  % annual risk of stroke. The patient's score is based upon:              Physical Exam:   VS:  BP (!) 150/62 (BP Location: Right Arm, Patient Position: Sitting, Cuff Size: Normal)   Pulse (!) 49   Ht 5\' 7"  (1.702 m)   Wt 133 lb (60.3 kg)   LMP 03/20/2000   SpO2 99%   BMI 20.83 kg/m    Wt Readings from Last 3 Encounters:  07/10/23 133 lb (60.3 kg)  06/21/22 134 lb (60.8 kg)  06/05/22 137 lb 6.4 oz (62.3 kg)     GEN: Well nourished, well developed in no acute distress NECK: No JVD; No carotid bruits CARDIAC: Regular rate and rhythm, no murmurs, rubs, gallops RESPIRATORY:  Clear to auscultation without rales, wheezing or rhonchi  ABDOMEN: Soft, non-tender, non-distended EXTREMITIES:  No edema; No deformity   ASSESSMENT AND PLAN:    1.   Paroxysmal atrial fibrillation: Has had rapid ventricular response.  She has had an increase in her palpitations.  She has potentially had an episode that terminated with vagal maneuvers.  If she does wish further therapy, may need to place cardiac monitor.  For now, we Etoile Looman hold off on further therapy.  2.  PACs: Minimally symptomatic  Follow up with EP APP in 12 months  Signed, Ofelia Podolski Cortland Ding, MD

## 2023-07-12 ENCOUNTER — Other Ambulatory Visit: Payer: Self-pay | Admitting: Cardiology

## 2023-07-12 DIAGNOSIS — I480 Paroxysmal atrial fibrillation: Secondary | ICD-10-CM

## 2023-07-13 NOTE — Telephone Encounter (Signed)
 Prescription refill request for Eliquis  received. Indication:afib Last office visit:4/25 ZOX:WRUEA labs Age: 73 Weight:60.3  kg  Prescription refilled

## 2023-07-20 ENCOUNTER — Telehealth: Payer: Self-pay

## 2023-07-20 ENCOUNTER — Other Ambulatory Visit: Payer: Self-pay | Admitting: Internal Medicine

## 2023-07-20 NOTE — Telephone Encounter (Signed)
 Dr. Monnie Anthony, patient will be scheduled as soon as possible.  Auth Submission: APPROVED Site of care: Site of care: [facility] Payer: [employer] medicare Medication & CPT/J Code(s) submitted: Prolia  (Denosumab ) [id number] Route of submission (phone, fax, portal): portal Phone # Fax # Auth type: Buy/Bill PB Units/visits requested: 60mg  x 2 doses Reference number: [id number] Approval from: 06/04/19 to 03/19/24

## 2023-07-23 DIAGNOSIS — H401133 Primary open-angle glaucoma, bilateral, severe stage: Secondary | ICD-10-CM | POA: Diagnosis not present

## 2023-07-30 NOTE — Patient Instructions (Incomplete)
  HEALTH MAINTENANCE RECOMMENDATIONS:  It is recommended that you get at least 30 minutes of aerobic exercise at least 5 days/week (for weight loss, you may need as much as 60-90 minutes). This can be any activity that gets your heart rate up. This can be divided in 10-15 minute intervals if needed, but try and build up your endurance at least once a week.  Weight bearing exercise is also recommended twice weekly.  Eat a healthy diet with lots of vegetables, fruits and fiber.  "Colorful" foods have a lot of vitamins (ie green vegetables, tomatoes, red peppers, etc).  Limit sweet tea, regular sodas and alcoholic beverages, all of which has a lot of calories and sugar.  Up to 1 alcoholic drink daily may be beneficial for women (unless trying to lose weight, watch sugars).  Drink a lot of water.  Calcium  recommendations are 1200-1500 mg daily (1500 mg for postmenopausal women or women without ovaries), and vitamin D  1000 IU daily.  This should be obtained from diet and/or supplements (vitamins), and calcium  should not be taken all at once, but in divided doses.  Monthly self breast exams and yearly mammograms for women over the age of 71 is recommended.  Sunscreen of at least SPF 30 should be used on all sun-exposed parts of the skin when outside between the hours of 10 am and 4 pm (not just when at beach or pool, but even with exercise, golf, tennis, and yard work!)  Use a sunscreen that says "broad spectrum" so it covers both UVA and UVB rays, and make sure to reapply every 1-2 hours.  Remember to change the batteries in your smoke detectors when changing your clock times in the spring and fall. Carbon monoxide detectors are recommended for your home.  Use your seat belt every time you are in a car, and please drive safely and not be distracted with cell phones and texting while driving.   Paige Klein , Thank you for taking time to come for your Medicare Wellness Visit. I appreciate your ongoing  commitment to your health goals. Please review the following plan we discussed and let me know if I can assist you in the future.   This is a list of the screening recommended for you and due dates:  Health Maintenance  Topic Date Due   DTaP/Tdap/Td vaccine (4 - Td or Tdap) 04/14/2019   COVID-19 Vaccine (3 - Pfizer risk series) 07/28/2019   Mammogram  10/17/2023   Flu Shot  10/19/2023   Medicare Annual Wellness Visit  07/30/2024   Colon Cancer Screening  11/26/2031   Pneumonia Vaccine  Completed   DEXA scan (bone density measurement)  Completed   Hepatitis C Screening  Completed   Zoster (Shingles) Vaccine  Completed   HPV Vaccine  Aged Out   Meningitis B Vaccine  Aged Out   You are past due for your tetanus shot.  Please go to the pharmacy THIS WEEK and get TdaP.  Consider the RSV vaccine in the Fall, along with your flu shot, vs waiting until you are 74.  Next bone density test will be due in 10/2024.

## 2023-07-30 NOTE — Progress Notes (Unsigned)
 No chief complaint on file.  Paige Klein is a 73 y.o. female who presents for annual physical exam, Medicare AWV and f/u on chronic problems.  Hypercholesterolemia and aortic atherosclerosis (on CT 10/2020):  Cholesterol had been diet-controlled. She was started once weekly Crestor  due to atherosclerosis.   She is tolerating once weekly rosuvastatin  without side effects.  Last lipids were at goal, due for recheck today.  She continues to follow a low cholesterol diet. She eats eggs in spurts; tends to eat more chicken. She hasn't had burgers recently, but having more sausage She eats many meatless meals. <1 egg/week when boyfriend isn't around. No creamy things. Some mayo on tomato sandwiches. She hasn't eaten any pimiento cheese in a year, but did start back having more cheese (because she cut back on calcium  tablet intake).  ***UPDATE DIET  Component Ref Range & Units (hover) 1 yr ago (06/05/22) 1 yr ago (09/01/21) 2 yr ago (05/30/21) 3 yr ago (04/02/20) 4 yr ago (03/27/19) 5 yr ago (03/25/18) 8 yr ago (03/04/15)  Cholesterol, Total 174 185 231 High  198 232 High  244 High    Triglycerides 88 101 103 99 114 88 84 R  HDL 68 56 70 51 73 70 55 R  VLDL Cholesterol Cal 16 18 18 18 20 18    LDL Chol Calc (NIH) 90 111 High  143 High  129 High  139 High     Chol/HDL Ratio 2.6 3.3 CM 3.3 CM 3.9 CM 3.2 CM 3.5 CM 3.0 R    Osteoporosis:  She continues on Prolia  q6 months without side effects. This was started in 05/2015.  Last dose of Prolia  was 02/2023, she is scheduled for June through the infusion center. She is s/p kyphoplasty 04/2015. She previously took Actonel x 6 years, changed to Fosamax  (insurance reasons), stopped in 11/2011.  Last DEXA was in 10/2022 stable T-2.1 L fem neck. Has some cheese.  Oatmeal with 2% milk when she is home by herself. She is taking citracal once daily, and MVI daily.   Paroxysmal atrial fibrillation: Diagnosed in 2019. She is compliant with Eliquis , and  is under the care of Dr. Kin Penner. She last saw Dr. Lawana Pray in 06/2023.  She uses diltiazem  just prn, usually if heart is racing when she is trying to go to sleep. She needed this just a few times in the last year. There is no associated chest pain or dyspnea.  No changes were made. Discussed heart monitor if increased palpitations/tachycardia. Last echo was 10/2021.  Glaucoma--She no longer drives at night.  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 "It is like looking through fog all the time" in both eyes. Just slightly worse in the last year. Pressures were higher when on Flonase, tries to use only sparingly (had been prescribed by ENT she saw for sinus infections) .   Immunization History  Administered Date(s) Administered   Fluad Quad(high Dose 65+) 12/27/2018, 05/19/2020, 12/19/2021   Influenza Split 03/21/2007, 03/20/2008, 01/19/2011   Influenza, High Dose Seasonal PF 03/04/2015, 03/09/2016, 01/15/2017, 01/17/2018   Influenza, Seasonal, Injecte, Preservative Fre 02/21/2012   Influenza,inj,Quad PF,6+ Mos 02/24/2013, 02/25/2014   PFIZER(Purple Top)SARS-COV-2 Vaccination 06/09/2019, 06/30/2019   PNEUMOCOCCAL CONJUGATE-20 06/05/2022   PPD Test 02/11/1997   Pneumococcal Conjugate-13 03/04/2015   Pneumococcal Polysaccharide-23 03/09/2016   Td 10/04/1990, 03/18/2001   Tdap 04/13/2009   Zoster Recombinant(Shingrix) 04/08/2021, 10/25/2021   Zoster, Live 08/20/2012  Declines COVID booster Last Pap smear: 03/2018--normal with no high risk HPV Last mammogram:09/2022 at [facility] Last colonoscopy: 11/2021 1 polyp--sessile serrated adenoma.  No repeat rec due to age  Last DEXA:  10/2022 stable T-2.1 L fem neck at [facility] (prior was 09/2020 T-2.1) Prolia  started 05/2015 Ophtho: every 3-4 months (glaucoma) Dentist: regularly, every 6 months Exercise:    Weights, treadmill/bike at the [facility] 2-3x/week  Vitamin  D level was normal twice in the past on current vitamins (last was 34 in 02/2014).   Patient Care Team: Roosvelt Colla, MD as PCP - General (Family Medicine) Lei Pump, MD as PCP - Cardiology (Cardiology) Lei Pump, MD as PCP - Electrophysiology (Cardiology) GI: Dr. Tova Fresh Eye specialist at [facility]: Dr. Leanor Proper Dentist: Dr. Courtland Ditch GYN: Dr. Cathe Clore (retired) Derm: Dr. Joanne Muckle ENT: Dr. Westley Hammers   Depression Screening: Flowsheet Row Office Visit from 06/05/2022 in [facility]  PHQ-2 Total Score 0        Falls screen:     06/05/2022    9:42 AM 05/30/2021    9:46 AM 04/05/2020    8:22 AM 03/27/2019    9:50 AM 03/25/2018    9:30 AM  Fall Risk   Falls in the past year? 1 0 0 0   Comment  does not think so     Number falls in past yr: 1 0     Comment 3/9-walking across the floor and tripped 3/14 missed chair and fell      Injury with Fall? 0 0     Risk for fall due to : History of fall(s) No Fall Risks   Impaired vision  Follow up Falls evaluation completed Falls evaluation completed   Education provided    Falls are vision-related (can't see well inferiorly, without specifically looking down), and worse vision at home after using her drops.  Functional Status Survey:          End of Life Discussion:  Patient has a living will and medical power of attorney, scanned in chart.  PMH, PSH, SH and FH reviewed and updated.    ROS: The patient denies anorexia, fever, weight changes, headaches, decreased hearing, ear pain, sore throat, breast concerns, chest pain, dizziness, syncope, dyspnea on exertion, swelling, nausea, vomiting, abdominal pain, melena, hematochezia, indigestion/heartburn, hematuria,dysuria, no postmenopausal bleeding, vaginal discharge, odor or itch, genital lesions, numbness, tingling, weakness, tremor, suspicious skin lesions, depression, anxiety.   Some urinary urgency, only rare leakage Chronic runny nose and eyes--somewhat improved since using  claritin (Eyes are still watery though) +vision loss due to glaucoma per HPI  Has chronic hyperpigmentation on the lower legs, L>R, unchanged per pt. No chest pain or shortness of breath.  Periodic tachycardia/palpitations at night. Fingers go numb and turn white when she holds something very cold (such as cutting a cold onion), resolves quickly when she warms up her hands. Unchanged    PHYSICAL EXAM:  LMP 03/20/2000   Wt Readings from Last 3 Encounters:  07/10/23 133 lb (60.3 kg)  06/21/22 134 lb (60.8 kg)  06/05/22 137 lb 6.4 oz (62.3 kg)    General Appearance:    Alert, cooperative, no distress, appears her age    Head:    Normocephalic, without obvious abnormality, atraumatic     Eyes:    PERRL, EOMI, fundi not well visualized; conjunctiva is moderately injected bilaterally  Ears:    Normal TM's and external ear canals     Nose:    No drainage or sinus  tenderness  Throat:    Normal mucosa  Neck:    Supple, no lymphadenopathy; thyroid: no enlargement/tenderness/nodules; no carotid bruit or JVD     Back:    Spine nontender, no curvature, ROM normal, no CVA tenderness     Lungs:    Clear to auscultation bilaterally without wheezes, rales or rhonchi; respirations unlabored     Chest Wall:    No tenderness or deformity     Heart:    Bradycardic (50's), regular rhythm;  S1 and S2 normal, no murmur, rub or gallop.     Breast Exam:    No tenderness, masses, or nipple discharge or inversion. No axillary lymphadenopathy     Abdomen:    Soft, non-tender, nondistended, normoactive bowel sounds, no masses, no hepatosplenomegaly     Genitalia:    Normal external genitalia without lesions. + atrophic changes. No cervical motion tenderness. Uterus and adnexa not enlarged, nontender, no masses. Pap not performed   ***  Rectal:    Normal tone, no masses or tenderness; guaiac negative stool    ***  Extremities:    No clubbing, cyanosis or edema. WHSS at L knee.  Pulses:    2+ dorsalis pedis and PT  pulses  Skin:    Skin color, texture, turgor normal. Some hyperpigmentation noted in LE's, L>R.  Great toenails are discolored bilaterally--only the distal half of the L great toenail appears somewhat thickened, proximal half is normal (appears to be growing in normally). ***  Lymph nodes:    Cervical, supraclavicular, inguinal and axillary nodes normal     Neurologic:    Normal strength, sensation and gait; reflexes 2+ and symmetric throughout                         Psych:     Normal mood, affect, hygiene and grooming  Update skin--bruising?  Toenails Update if pelvic/rectal not tone  ASSESSMENT/PLAN:  Did she ever get TdaP or RSV? Still declines covid boosters? (Decline in chart if not desired) No pelvic/rectal needed if not having issues/complaints.  Discussed monthly self breast exams and yearly mammograms; at least 30 minutes of aerobic activity at least 5 days/week, weight-bearing exercise 2x/week; proper sunscreen use reviewed; healthy diet, including goals of calcium  and vitamin D  intake and alcohol recommendations (less than or equal to 1 drink/day) reviewed; regular seatbelt use; changing batteries in smoke detectors.  Immunization recommendations discussed--continue yearly high dose flu shots. She declines COVID boosters. Tdap is past due, reminded to get from pharmacy. RSV vaccine recommended in the Fall *** Prevnar-20 was given today. COVID booster--strongly encouraged, declined today Colonoscopy recommendations reviewed--UTD DEXA due 10/2024  Full Code, Full Care MOST form reviewed, unchanged.   F/u in 1 year for CPE/AWV/med check, sooner prn.    Medicare Attestation I have personally reviewed: The patient's medical and social history Their use of alcohol, tobacco or illicit drugs Their current medications and supplements The patient's functional ability including ADLs,fall risks, home safety risks, cognitive, and hearing and visual impairment Diet and physical  activities Evidence for depression or mood disorders   The patient's weight, height and BMI have been recorded in the chart.  I have made referrals, counseling, and provided education to the patient based on review of the above and I have provided the patient with a written personalized care plan for preventive services.      Paulett Boros, MD

## 2023-07-31 ENCOUNTER — Encounter: Payer: Self-pay | Admitting: Family Medicine

## 2023-07-31 ENCOUNTER — Ambulatory Visit: Payer: Medicare PPO | Admitting: Family Medicine

## 2023-07-31 VITALS — BP 124/64 | HR 64 | Ht 67.0 in | Wt 131.8 lb

## 2023-07-31 DIAGNOSIS — Z7901 Long term (current) use of anticoagulants: Secondary | ICD-10-CM | POA: Diagnosis not present

## 2023-07-31 DIAGNOSIS — I700 Atherosclerosis of aorta: Secondary | ICD-10-CM | POA: Diagnosis not present

## 2023-07-31 DIAGNOSIS — Z0000 Encounter for general adult medical examination without abnormal findings: Secondary | ICD-10-CM

## 2023-07-31 DIAGNOSIS — Z5181 Encounter for therapeutic drug level monitoring: Secondary | ICD-10-CM

## 2023-07-31 DIAGNOSIS — H401133 Primary open-angle glaucoma, bilateral, severe stage: Secondary | ICD-10-CM

## 2023-07-31 DIAGNOSIS — I480 Paroxysmal atrial fibrillation: Secondary | ICD-10-CM | POA: Diagnosis not present

## 2023-07-31 DIAGNOSIS — M810 Age-related osteoporosis without current pathological fracture: Secondary | ICD-10-CM | POA: Diagnosis not present

## 2023-07-31 DIAGNOSIS — E7800 Pure hypercholesterolemia, unspecified: Secondary | ICD-10-CM | POA: Diagnosis not present

## 2023-07-31 LAB — LIPID PANEL

## 2023-08-01 ENCOUNTER — Ambulatory Visit: Payer: Self-pay | Admitting: Family Medicine

## 2023-08-01 ENCOUNTER — Other Ambulatory Visit: Payer: Self-pay | Admitting: *Deleted

## 2023-08-01 DIAGNOSIS — E7800 Pure hypercholesterolemia, unspecified: Secondary | ICD-10-CM

## 2023-08-01 DIAGNOSIS — I700 Atherosclerosis of aorta: Secondary | ICD-10-CM

## 2023-08-01 DIAGNOSIS — Z5181 Encounter for therapeutic drug level monitoring: Secondary | ICD-10-CM

## 2023-08-01 LAB — CBC WITH DIFFERENTIAL/PLATELET
Basophils Absolute: 0.1 10*3/uL (ref 0.0–0.2)
Basos: 1 %
EOS (ABSOLUTE): 0.1 10*3/uL (ref 0.0–0.4)
Eos: 1 %
Hematocrit: 41.1 % (ref 34.0–46.6)
Hemoglobin: 13.4 g/dL (ref 11.1–15.9)
Immature Grans (Abs): 0 10*3/uL (ref 0.0–0.1)
Immature Granulocytes: 0 %
Lymphocytes Absolute: 1.4 10*3/uL (ref 0.7–3.1)
Lymphs: 26 %
MCH: 31.5 pg (ref 26.6–33.0)
MCHC: 32.6 g/dL (ref 31.5–35.7)
MCV: 97 fL (ref 79–97)
Monocytes Absolute: 0.5 10*3/uL (ref 0.1–0.9)
Monocytes: 9 %
Neutrophils Absolute: 3.4 10*3/uL (ref 1.4–7.0)
Neutrophils: 63 %
Platelets: 311 10*3/uL (ref 150–450)
RBC: 4.26 x10E6/uL (ref 3.77–5.28)
RDW: 12.1 % (ref 11.7–15.4)
WBC: 5.4 10*3/uL (ref 3.4–10.8)

## 2023-08-01 LAB — COMPREHENSIVE METABOLIC PANEL WITH GFR
ALT: 19 IU/L (ref 0–32)
AST: 25 IU/L (ref 0–40)
Albumin: 4.4 g/dL (ref 3.8–4.8)
Alkaline Phosphatase: 62 IU/L (ref 44–121)
BUN/Creatinine Ratio: 24 (ref 12–28)
BUN: 22 mg/dL (ref 8–27)
Bilirubin Total: 0.3 mg/dL (ref 0.0–1.2)
CO2: 22 mmol/L (ref 20–29)
Calcium: 9.6 mg/dL (ref 8.7–10.3)
Chloride: 105 mmol/L (ref 96–106)
Creatinine, Ser: 0.91 mg/dL (ref 0.57–1.00)
Globulin, Total: 2.7 g/dL (ref 1.5–4.5)
Glucose: 88 mg/dL (ref 70–99)
Potassium: 4.5 mmol/L (ref 3.5–5.2)
Sodium: 141 mmol/L (ref 134–144)
Total Protein: 7.1 g/dL (ref 6.0–8.5)
eGFR: 67 mL/min/{1.73_m2} (ref >59)

## 2023-08-01 LAB — LIPID PANEL
Cholesterol, Total: 185 mg/dL (ref 100–199)
HDL: 62 mg/dL (ref >39)
LDL CALC COMMENT:: 3 ratio (ref 0.0–4.4)
LDL Chol Calc (NIH): 104 mg/dL — ABNORMAL HIGH (ref 0–99)
Triglycerides: 109 mg/dL (ref 0–149)
VLDL Cholesterol Cal: 19 mg/dL (ref 5–40)

## 2023-08-01 LAB — TSH: TSH: 2.42 u[IU]/mL (ref 0.450–4.500)

## 2023-08-01 MED ORDER — ROSUVASTATIN CALCIUM 20 MG PO TABS: ORAL_TABLET | ORAL | 0 refills | Status: DC

## 2023-08-09 ENCOUNTER — Encounter: Payer: Medicare PPO | Admitting: Family Medicine

## 2023-09-12 ENCOUNTER — Other Ambulatory Visit: Payer: Self-pay | Admitting: Family Medicine

## 2023-09-12 DIAGNOSIS — I700 Atherosclerosis of aorta: Secondary | ICD-10-CM

## 2023-09-12 DIAGNOSIS — E7800 Pure hypercholesterolemia, unspecified: Secondary | ICD-10-CM

## 2023-09-17 ENCOUNTER — Ambulatory Visit

## 2023-09-17 VITALS — BP 144/51 | HR 54 | Temp 98.5°F | Resp 18 | Ht 67.0 in | Wt 132.6 lb

## 2023-09-17 DIAGNOSIS — M810 Age-related osteoporosis without current pathological fracture: Secondary | ICD-10-CM | POA: Diagnosis not present

## 2023-09-17 MED ORDER — DENOSUMAB 60 MG/ML ~~LOC~~ SOSY
60.0000 mg | PREFILLED_SYRINGE | Freq: Once | SUBCUTANEOUS | Status: AC
  Administered 2023-09-17: 60 mg via SUBCUTANEOUS
  Filled 2023-09-17: qty 1

## 2023-09-17 NOTE — Progress Notes (Signed)
 Diagnosis: Osteoporosis  Provider:  Chilton Greathouse MD  Procedure: Injection  Prolia (Denosumab), Dose: 60 mg, Site: subcutaneous, Number of injections: 1  Injection Site(s): Left arm  Post Care:  left arm injection  Discharge: Condition: Good, Destination: Home . AVS Declined  Performed by:  Rico Ala, LPN

## 2023-10-13 ENCOUNTER — Other Ambulatory Visit: Payer: Self-pay | Admitting: Cardiology

## 2023-10-13 DIAGNOSIS — I480 Paroxysmal atrial fibrillation: Secondary | ICD-10-CM

## 2023-10-15 NOTE — Telephone Encounter (Signed)
 Prescription refill request for Eliquis  received. Indication: PAF Last office visit: 07/10/23  Paige Norton MD Scr: 0.91 on 07/31/23  Epic Age: 73 Weight: 60.3kg  Based on above findings Eliquis  5mg  twice daily is the appropriate dose.  Refill approved.

## 2023-10-23 DIAGNOSIS — Z1231 Encounter for screening mammogram for malignant neoplasm of breast: Secondary | ICD-10-CM | POA: Diagnosis not present

## 2023-10-23 LAB — HM MAMMOGRAPHY

## 2023-10-25 ENCOUNTER — Encounter: Payer: Self-pay | Admitting: Family Medicine

## 2023-11-01 ENCOUNTER — Other Ambulatory Visit

## 2023-11-01 DIAGNOSIS — E7800 Pure hypercholesterolemia, unspecified: Secondary | ICD-10-CM

## 2023-11-01 DIAGNOSIS — Z5181 Encounter for therapeutic drug level monitoring: Secondary | ICD-10-CM

## 2023-11-02 LAB — LIPID PANEL
Chol/HDL Ratio: 2.6 ratio (ref 0.0–4.4)
Cholesterol, Total: 150 mg/dL (ref 100–199)
HDL: 57 mg/dL (ref >39)
LDL Chol Calc (NIH): 79 mg/dL (ref 0–99)
Triglycerides: 73 mg/dL (ref 0–149)
VLDL Cholesterol Cal: 14 mg/dL (ref 5–40)

## 2023-11-02 LAB — HEPATIC FUNCTION PANEL
ALT: 18 IU/L (ref 0–32)
AST: 25 IU/L (ref 0–40)
Albumin: 4.1 g/dL (ref 3.8–4.8)
Alkaline Phosphatase: 56 IU/L (ref 44–121)
Bilirubin Total: 0.3 mg/dL (ref 0.0–1.2)
Bilirubin, Direct: 0.11 mg/dL (ref 0.00–0.40)
Total Protein: 6.8 g/dL (ref 6.0–8.5)

## 2023-11-03 ENCOUNTER — Ambulatory Visit: Payer: Self-pay | Admitting: Family Medicine

## 2023-11-03 DIAGNOSIS — I700 Atherosclerosis of aorta: Secondary | ICD-10-CM

## 2023-11-03 DIAGNOSIS — E7800 Pure hypercholesterolemia, unspecified: Secondary | ICD-10-CM

## 2023-11-03 MED ORDER — ROSUVASTATIN CALCIUM 20 MG PO TABS: ORAL_TABLET | ORAL | 2 refills | Status: AC

## 2023-11-05 DIAGNOSIS — R928 Other abnormal and inconclusive findings on diagnostic imaging of breast: Secondary | ICD-10-CM | POA: Diagnosis not present

## 2023-11-05 DIAGNOSIS — N6489 Other specified disorders of breast: Secondary | ICD-10-CM | POA: Diagnosis not present

## 2023-11-06 ENCOUNTER — Encounter: Payer: Self-pay | Admitting: Family Medicine

## 2023-11-14 ENCOUNTER — Other Ambulatory Visit: Payer: Self-pay

## 2023-11-14 DIAGNOSIS — N6012 Diffuse cystic mastopathy of left breast: Secondary | ICD-10-CM | POA: Diagnosis not present

## 2023-11-14 DIAGNOSIS — C50212 Malignant neoplasm of upper-inner quadrant of left female breast: Secondary | ICD-10-CM | POA: Diagnosis not present

## 2023-11-14 DIAGNOSIS — R92333 Mammographic heterogeneous density, bilateral breasts: Secondary | ICD-10-CM | POA: Diagnosis not present

## 2023-11-14 DIAGNOSIS — R928 Other abnormal and inconclusive findings on diagnostic imaging of breast: Secondary | ICD-10-CM | POA: Diagnosis not present

## 2023-11-14 DIAGNOSIS — N6322 Unspecified lump in the left breast, upper inner quadrant: Secondary | ICD-10-CM | POA: Diagnosis not present

## 2023-11-14 LAB — HM MAMMOGRAPHY

## 2023-11-15 LAB — SURGICAL PATHOLOGY

## 2023-11-16 ENCOUNTER — Encounter: Payer: Self-pay | Admitting: Family Medicine

## 2023-11-20 ENCOUNTER — Encounter: Payer: Self-pay | Admitting: Genetic Counselor

## 2023-11-20 ENCOUNTER — Encounter: Payer: Self-pay | Admitting: *Deleted

## 2023-11-20 DIAGNOSIS — C50212 Malignant neoplasm of upper-inner quadrant of left female breast: Secondary | ICD-10-CM | POA: Insufficient documentation

## 2023-11-21 ENCOUNTER — Inpatient Hospital Stay: Attending: Hematology and Oncology

## 2023-11-21 ENCOUNTER — Ambulatory Visit
Admission: RE | Admit: 2023-11-21 | Discharge: 2023-11-21 | Disposition: A | Discharge status: Home / Self Care | Source: Ambulatory Visit | Attending: Radiation Oncology | Admitting: Radiation Oncology

## 2023-11-21 ENCOUNTER — Inpatient Hospital Stay: Admitting: Licensed Clinical Social Worker

## 2023-11-21 ENCOUNTER — Other Ambulatory Visit: Payer: Self-pay | Admitting: General Surgery

## 2023-11-21 ENCOUNTER — Inpatient Hospital Stay (HOSPITAL_BASED_OUTPATIENT_CLINIC_OR_DEPARTMENT_OTHER): Admitting: Hematology and Oncology

## 2023-11-21 ENCOUNTER — Encounter: Payer: Self-pay | Admitting: *Deleted

## 2023-11-21 ENCOUNTER — Telehealth: Payer: Self-pay | Admitting: *Deleted

## 2023-11-21 ENCOUNTER — Inpatient Hospital Stay: Admitting: Genetic Counselor

## 2023-11-21 ENCOUNTER — Ambulatory Visit: Admitting: Physical Therapy

## 2023-11-21 ENCOUNTER — Encounter: Payer: Self-pay | Admitting: Genetic Counselor

## 2023-11-21 VITALS — BP 139/41 | HR 62 | Temp 97.7°F | Resp 18 | Wt 137.3 lb

## 2023-11-21 DIAGNOSIS — C50212 Malignant neoplasm of upper-inner quadrant of left female breast: Secondary | ICD-10-CM | POA: Insufficient documentation

## 2023-11-21 DIAGNOSIS — Z8041 Family history of malignant neoplasm of ovary: Secondary | ICD-10-CM | POA: Diagnosis not present

## 2023-11-21 DIAGNOSIS — Z8616 Personal history of COVID-19: Secondary | ICD-10-CM | POA: Diagnosis not present

## 2023-11-21 DIAGNOSIS — Z1721 Progesterone receptor positive status: Secondary | ICD-10-CM | POA: Insufficient documentation

## 2023-11-21 DIAGNOSIS — M810 Age-related osteoporosis without current pathological fracture: Secondary | ICD-10-CM | POA: Diagnosis not present

## 2023-11-21 DIAGNOSIS — Z79899 Other long term (current) drug therapy: Secondary | ICD-10-CM | POA: Insufficient documentation

## 2023-11-21 DIAGNOSIS — Z7901 Long term (current) use of anticoagulants: Secondary | ICD-10-CM | POA: Insufficient documentation

## 2023-11-21 DIAGNOSIS — I480 Paroxysmal atrial fibrillation: Secondary | ICD-10-CM | POA: Diagnosis not present

## 2023-11-21 DIAGNOSIS — I341 Nonrheumatic mitral (valve) prolapse: Secondary | ICD-10-CM | POA: Insufficient documentation

## 2023-11-21 DIAGNOSIS — Z803 Family history of malignant neoplasm of breast: Secondary | ICD-10-CM | POA: Diagnosis not present

## 2023-11-21 DIAGNOSIS — Z1732 Human epidermal growth factor receptor 2 negative status: Secondary | ICD-10-CM | POA: Insufficient documentation

## 2023-11-21 DIAGNOSIS — E7800 Pure hypercholesterolemia, unspecified: Secondary | ICD-10-CM | POA: Diagnosis not present

## 2023-11-21 DIAGNOSIS — Z171 Estrogen receptor negative status [ER-]: Secondary | ICD-10-CM | POA: Diagnosis not present

## 2023-11-21 DIAGNOSIS — Z170 Estrogen receptor positive status [ER+]: Secondary | ICD-10-CM

## 2023-11-21 DIAGNOSIS — Z8719 Personal history of other diseases of the digestive system: Secondary | ICD-10-CM | POA: Insufficient documentation

## 2023-11-21 DIAGNOSIS — Z809 Family history of malignant neoplasm, unspecified: Secondary | ICD-10-CM | POA: Diagnosis not present

## 2023-11-21 DIAGNOSIS — Z800 Family history of malignant neoplasm of digestive organs: Secondary | ICD-10-CM | POA: Insufficient documentation

## 2023-11-21 DIAGNOSIS — Z806 Family history of leukemia: Secondary | ICD-10-CM | POA: Diagnosis not present

## 2023-11-21 DIAGNOSIS — Z87442 Personal history of urinary calculi: Secondary | ICD-10-CM | POA: Insufficient documentation

## 2023-11-21 DIAGNOSIS — I4891 Unspecified atrial fibrillation: Secondary | ICD-10-CM | POA: Insufficient documentation

## 2023-11-21 DIAGNOSIS — E559 Vitamin D deficiency, unspecified: Secondary | ICD-10-CM | POA: Insufficient documentation

## 2023-11-21 LAB — CMP (CANCER CENTER ONLY)
ALT: 17 U/L (ref 0–44)
AST: 21 U/L (ref 15–41)
Albumin: 4.2 g/dL (ref 3.5–5.0)
Alkaline Phosphatase: 45 U/L (ref 38–126)
Anion gap: 5 (ref 5–15)
BUN: 17 mg/dL (ref 8–23)
CO2: 27 mmol/L (ref 22–32)
Calcium: 9.3 mg/dL (ref 8.9–10.3)
Chloride: 108 mmol/L (ref 98–111)
Creatinine: 0.78 mg/dL (ref 0.44–1.00)
GFR, Estimated: >60 mL/min (ref >60)
Glucose, Bld: 88 mg/dL (ref 70–99)
Potassium: 4.3 mmol/L (ref 3.5–5.1)
Sodium: 140 mmol/L (ref 135–145)
Total Bilirubin: 0.4 mg/dL (ref 0.0–1.2)
Total Protein: 7.5 g/dL (ref 6.5–8.1)

## 2023-11-21 LAB — CBC WITH DIFFERENTIAL (CANCER CENTER ONLY)
Abs Immature Granulocytes: 0.01 K/uL (ref 0.00–0.07)
Basophils Absolute: 0.1 K/uL (ref 0.0–0.1)
Basophils Relative: 1 %
Eosinophils Absolute: 0 K/uL (ref 0.0–0.5)
Eosinophils Relative: 1 %
HCT: 38.9 % (ref 36.0–46.0)
Hemoglobin: 12.6 g/dL (ref 12.0–15.0)
Immature Granulocytes: 0 %
Lymphocytes Relative: 25 %
Lymphs Abs: 1.4 K/uL (ref 0.7–4.0)
MCH: 30.5 pg (ref 26.0–34.0)
MCHC: 32.4 g/dL (ref 30.0–36.0)
MCV: 94.2 fL (ref 80.0–100.0)
Monocytes Absolute: 0.5 K/uL (ref 0.1–1.0)
Monocytes Relative: 9 %
Neutro Abs: 3.5 K/uL (ref 1.7–7.7)
Neutrophils Relative %: 64 %
Platelet Count: 275 K/uL (ref 150–400)
RBC: 4.13 MIL/uL (ref 3.87–5.11)
RDW: 13.2 % (ref 11.5–15.5)
WBC Count: 5.5 K/uL (ref 4.0–10.5)
nRBC: 0 % (ref 0.0–0.2)

## 2023-11-21 LAB — RESEARCH LABS

## 2023-11-21 LAB — GENETIC SCREENING ORDER

## 2023-11-21 NOTE — Progress Notes (Signed)
 Radiation Oncology         [phone] ________________________________  Name: Paige Klein        MRN: [id number]  Date of Service: 11/21/2023 DOB: [birth year]  Klein:Xwjee, [name], MD  Paige Shoulders, MD     REFERRING PHYSICIAN: Aron Shoulders, MD   DIAGNOSIS: The encounter diagnosis was Malignant neoplasm of upper-inner quadrant of left breast in female, estrogen receptor positive (HCC).   HISTORY OF PRESENT ILLNESS: Paige Klein is a 73 y.o. female seen in the multidisciplinary breast clinic for a new diagnosis of left breast cancer. The patient was noted to have a screening left breast asymmetry that persisted with diagnostic mammogram on 11/05/23. By ultrasound that day, she has a mass in the 11:00 position measuring 1.3 cm in greatest dimension and her axilla was negative for adenopathy. 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 An additional biopsy at 11:00 1cmfn was benign with fibrocystic and usual ductal hyperplasia. Her cancer was ER/PR positive, HER2 negative with a Ki 67 of 5%. She's seen today to discuss treatment recommendations.     PREVIOUS RADIATION THERAPY: No   PAST MEDICAL HISTORY:  Past Medical History:  Diagnosis Date   A-fib (HCC) 05/22/2017   Abnormal Pap smear of cervix 1999   ASGUS x 1   Allergic rhinitis, cause unspecified    mold; and vasomotor rhinitis   Bradycardia 02/25/2014   Cataract    COVID-19 03/2020   Fibrocystic breast changes    and also breast calcifications (08/2008)   Glaucoma    Glaucoma    followed by [facility] Dr. Geneva   Glaucoma suspect of both eyes    high-normal intraocular pressures (Dr. Robinson)   H/O bone density study 09/07/09   08/16/07, 08/08/05; osteoporosis   H/O mammogram 11/23/10   normal   Hypercholesteremia    borderline per NMR lipoprofile 2008, 2007   Internal hemorrhoids    Kidney stone 1986   right   Migraine headache    resolved with menopause (menstrual  migraines)   MVP (mitral valve prolapse)    per echo   Osteoporosis    Osteoporosis 02/22/2012   Pap smear for cervical cancer screening 04/14/09   negative for malignancy, +atrophic changes   Postmenopausal    on compounded cream daily   Unspecified vitamin D  deficiency 2009   Vitamin D  deficiency 02/22/2012       PAST SURGICAL HISTORY: Past Surgical History:  Procedure Laterality Date   BREAST CYST ASPIRATION     many (Dr. Debby)   BREAST FIBROADENOMA SURGERY  09-03   Dr.Gooden   cardiolite (stress test)  11/05   Dr. Harl   cataract surgery  2010   bilateral   CERVICAL POLYPECTOMY  2003   treated with cryosurgery   COLONOSCOPY  08/02/11; 05/2000   Dr. Kristie - rectal polyps 2013   DILATION AND CURETTAGE OF UTERUS  05/2009   uterine polyp, bleeding   endocervical cyst  12/96, 6/97   Dr Darina   EYE SURGERY     KYPHOPLASTY N/A 04/29/2015   Procedure: KYPHOPLASTY;  Surgeon: Oneil Priestly, MD;  Location: [facility] OR;  Service: Orthopedics;  Laterality: N/A;  Lumbar 1 kyphoplasty   PATELLAR TENDON REPAIR Left 10/27/2020   Procedure: PATELLA TENDON REPAIR;  Surgeon: Cristy Bonner DASEN, MD;  Location: [facility] OR;  Service: Orthopedics;  Laterality: Left;   PATELLECTOMY Left 10/27/2020   Procedure: PATELLECTOMY PARTIAL;  Surgeon: Cristy Bonner DASEN,  MD;  Location: [facility] OR;  Service: Orthopedics;  Laterality: Left;     FAMILY HISTORY:  Family History  Problem Relation Age of Onset   Hypertension Mother    Arthritis Mother    Breast cancer Mother        breast cancer in her 86's and 62's (bilateral)   Hypertension Father    Other Father        died of pancreatitis   Macular degeneration Sister    Ovarian cancer Maternal Grandmother    Cervical cancer Maternal Grandmother    Pancreatic cancer Maternal Grandfather    Muscular dystrophy Maternal Aunt        adult onset   Diabetes Maternal Aunt    Polymyalgia rheumatica Maternal Aunt    Celiac disease Maternal Uncle    Cancer Cousin        mouth  (nonsmoker)   Ovarian cancer Cousin 58   Arthritis Cousin      SOCIAL HISTORY:  reports that she has never smoked. She has never used smokeless tobacco. She reports current alcohol use. She reports that she does not use drugs. The patient is divorced and lives in [place]. She is a retired Systems analyst. She has a sister in [place]. She does not have any children. She's active in her church and enjoys being active physcially.    ALLERGIES: Brinzolamide-brimonidine, Cortisone, Fosamax  [alendronate  sodium], Timolol, and Tramadol    MEDICATIONS:  Current Outpatient Medications  Medication Sig Dispense Refill   acetaminophen  (TYLENOL ) 500 MG tablet Take 2 tablets (1,000 mg total) by mouth every 8 (eight) hours as needed. (Patient not taking: Reported on 07/31/2023) 30 tablet 0   apixaban  (ELIQUIS ) 5 MG TABS tablet TAKE 1 TABLET BY MOUTH TWICE A DAY 180 tablet 1   calcium  citrate-vitamin D  (CITRACAL+D) 315-200 MG-UNIT tablet Take 1 tablet by mouth at bedtime.     denosumab  (PROLIA ) 60 MG/ML SOLN injection Inject 60 mg into the skin every 6 (six) months. Administer in upper arm, thigh, or abdomen     diltiazem  (CARDIZEM ) 30 MG tablet Take 30 mg by mouth as needed (a-fib). (Patient not taking: Reported on 07/31/2023)     dorzolamide  (TRUSOPT ) 2 % ophthalmic solution Place 1 drop into both eyes 3 (three) times daily.  3   latanoprost  (XALATAN ) 0.005 % ophthalmic solution Place 1 drop into both eyes at bedtime.     LUTEIN PO Take 1 capsule by mouth at bedtime.     Multiple Vitamins-Minerals (CENTRUM SILVER PO) Take 1 tablet by mouth daily.     Olopatadine  HCl (PATADAY  OP) Apply 1 drop to eye as directed.     pilocarpine  (PILOCAR) 4 % ophthalmic solution Place 1 drop into both eyes 3 (three) times daily.     rosuvastatin  (CRESTOR ) 20 MG tablet TAKE 1 TABLET (20 MG TOTAL) BY MOUTH 2 DAYS PER WEEK 25 tablet 2   No current facility-administered medications for this visit.     REVIEW OF  SYSTEMS: On review of systems, the patient reports that she is doing well overall. She's concerned about her ability to drive to appointments due to her glaucoma. No breast specific complaints are verbalized.      PHYSICAL EXAM:  Wt Readings from Last 3 Encounters:  09/17/23 132 lb 9.6 oz (60.1 kg)  07/31/23 131 lb 12.8 oz (59.8 kg)  07/10/23 133 lb (60.3 kg)   Temp Readings from Last 3 Encounters:  09/17/23 98.5 F (36.9 C) (Oral)  12/19/21 98.6 F (37 C) (  Tympanic)  11/04/20 98.6 F (37 C)   BP Readings from Last 3 Encounters:  09/17/23 (!) 144/51  07/31/23 124/64  07/10/23 (!) 150/62   Pulse Readings from Last 3 Encounters:  09/17/23 (!) 54  07/31/23 64  07/10/23 (!) 49    In general this is a well appearing caucasian female in no acute distress. She's alert and oriented x4 and appropriate throughout the examination. Cardiopulmonary assessment is negative for acute distress and she exhibits normal effort. Bilateral breast exam is deferred.    ECOG = 1  0 - Asymptomatic (Fully active, able to carry on all predisease activities without restriction)  1 - Symptomatic but completely ambulatory (Restricted in physically strenuous activity but ambulatory and able to carry out work of a light or sedentary nature. For example, light housework, office work)  2 - Symptomatic, <50% in bed during the day (Ambulatory and capable of all self care but unable to carry out any work activities. Up and about more than 50% of waking hours)  3 - Symptomatic, >50% in bed, but not bedbound (Capable of only limited self-care, confined to bed or chair 50% or more of waking hours)  4 - Bedbound (Completely disabled. Cannot carry on any self-care. Totally confined to bed or chair)  5 - Death   Raylene MM, Creech RH, Tormey DC, et al. [phone]). Toxicity and response criteria of the [facility]. Am. DOROTHA Bridges. Oncol. 5 (6): 649-55    LABORATORY DATA:  Lab Results   Component Value Date   WBC 5.4 07/31/2023   HGB 13.4 07/31/2023   HCT 41.1 07/31/2023   MCV 97 07/31/2023   PLT 311 07/31/2023   Lab Results  Component Value Date   NA 141 07/31/2023   K 4.5 07/31/2023   CL 105 07/31/2023   CO2 22 07/31/2023   Lab Results  Component Value Date   ALT 18 11/01/2023   AST 25 11/01/2023   ALKPHOS 56 11/01/2023   BILITOT 0.3 11/01/2023      RADIOGRAPHY: No results found.     IMPRESSION/PLAN: 1. 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 Dr. Dewey discusses the pathology findings and reviews the nature of early stage left breast disease. The consensus from the breast conference includes breast conservation with lumpectomy. Dr. Loretha does not anticipate a role for chemotherapy. Dr. Dewey discusses the rationale for adjuvant external beam radiation to the left breast to reduce risks of local breast cancer recurrence. Dr. Dewey also discusses cases in which radiation may be optional for favorable cases based on final pathology. Dr. Loretha also recommends antiestrogen therapy, and the patient is aware if she forgoes radiation, she should receive antiestrogen therapy. We will follow-up with her final results of surgery, but if recommended Dr. Dewey would anticipate a course of 4 weeks of radiotherapy or possibly ultrahypofractionated course. We will meet back to make final decisions after her surgery about 3-4 weeks after surgery.  2. Possible genetic predisposition to malignancy. The patient is a candidate for genetic testing given her personal and family history. She will meet with our geneticist today in clinic. 3. Glaucoma. The patient has difficulty with driving, so if she proceeds with radiation, we will need to be mindful of her treatment time so she can safely come for therapy.   In a visit lasting 60 minutes, greater than 50% of the time was spent face to face  reviewing her  case, as well as in preparation of, discussing, and coordinating the patient's care.  The above documentation reflects my direct findings during this shared patient visit. Please see the separate note by Dr. Dewey on this date for the remainder of the patient's plan of care.    Donald KYM Husband, [facility]    **Disclaimer: This note was dictated with voice recognition software. Similar sounding words can inadvertently be transcribed and this note may contain transcription errors which may not have been corrected upon publication of note.**

## 2023-11-21 NOTE — Progress Notes (Signed)
 REFERRING PROVIDER: Aron Shoulders, MD [address] [place],  [place] [address]  PRIMARY PROVIDER:  Randol Dawes, MD  PRIMARY REASON FOR VISIT:  1. Family history of breast cancer   2. Family history of ovarian cancer   3. Family history of pancreatic cancer   4. Malignant neoplasm of upper-inner quadrant of left breast in female, estrogen receptor positive (HCC)      HISTORY OF PRESENT ILLNESS:   Paige Klein, a 73 y.o. female, was seen for a [place] cancer genetics consultation at the request of Dr. Aron due to a personal and family history of cancer.  Paige Klein presents to clinic today to discuss the possibility of a hereditary predisposition to cancer, genetic testing, and to further clarify her future cancer risks, as well as potential cancer risks for family members.   In September 2025, at the age of 89, Paige Klein was diagnosed with IDC of the left breast. The treatment plan lumpectomy and possible radiation therapy.    CANCER HISTORY:  Oncology History  Malignant neoplasm of upper-inner quadrant of left breast in female, estrogen receptor positive (HCC)  11/20/2023 Initial Diagnosis   Malignant neoplasm of upper-inner quadrant of left breast in female, estrogen receptor positive (HCC)   11/21/2023 Cancer Staging   Staging form: Breast, AJCC 8th Edition - Clinical stage from 11/21/2023: Stage IA (cT1c, cN0, cM0, G2, ER+, PR+, HER2-) - Signed by Lanell Donald Stagger, PA-C on 11/21/2023 Stage prefix: Initial diagnosis Method of lymph node assessment: Clinical Histologic grading system: 3 grade system      RISK FACTORS:  Menarche was at age 27.  First live birth at age N/A.  OCP use for approximately 0 years.  Ovaries intact: yes.  Hysterectomy: no.  Menopausal status: postmenopausal.  HRT use: 10 years. Colonoscopy: yes; normal. Mammogram within the last year: yes. Number of breast biopsies: 2. Up to date with pelvic exams: n/a. Any excessive  radiation exposure in the past: no  Past Medical History:  Diagnosis Date   A-fib (HCC) 05/22/2017   Abnormal Pap smear of cervix 1999   ASGUS x 1   Allergic rhinitis, cause unspecified    mold; and vasomotor rhinitis   Bradycardia 02/25/2014   Cataract    COVID-19 03/2020   Family history of breast cancer    Family history of ovarian cancer    Family history of pancreatic cancer    Fibrocystic breast changes    and also breast calcifications (08/2008)   Glaucoma    Glaucoma    followed by [facility] Dr. Geneva   Glaucoma suspect of both eyes    high-normal intraocular pressures (Dr. Robinson)   H/O bone density study 09/07/2009   08/16/07, 08/08/05; osteoporosis   H/O mammogram 11/23/2010   normal   Hypercholesteremia    borderline per NMR lipoprofile 2008, 2007   Internal hemorrhoids    Kidney stone 1986   right   Migraine headache    resolved with menopause (menstrual migraines)   MVP (mitral valve prolapse)    per echo   Osteoporosis    Osteoporosis 02/22/2012   Pap smear for cervical cancer screening 04/14/2009   negative for malignancy, +atrophic changes   Postmenopausal    on compounded cream daily   Unspecified vitamin D  deficiency 2009   Vitamin D  deficiency 02/22/2012    Past Surgical History:  Procedure Laterality Date   BREAST CYST ASPIRATION     many (Dr. Debby)   BREAST FIBROADENOMA SURGERY  09-03   Dr.Gooden   cardiolite (stress test)  11/05   Dr. Harl   cataract surgery  2010   bilateral   CERVICAL POLYPECTOMY  2003   treated with cryosurgery   COLONOSCOPY  08/02/11; 05/2000   Dr. Kristie - rectal polyps 2013   DILATION AND CURETTAGE OF UTERUS  05/2009   uterine polyp, bleeding   endocervical cyst  12/96, 6/97   Dr Darina   EYE SURGERY     KYPHOPLASTY N/A 04/29/2015   Procedure: KYPHOPLASTY;  Surgeon: Oneil Priestly, MD;  Location: [facility] OR;  Service: Orthopedics;  Laterality: N/A;  Lumbar 1 kyphoplasty   PATELLAR TENDON REPAIR Left 10/27/2020   Procedure:  PATELLA TENDON REPAIR;  Surgeon: Cristy Bonner DASEN, MD;  Location: [facility] OR;  Service: Orthopedics;  Laterality: Left;   PATELLECTOMY Left 10/27/2020   Procedure: PATELLECTOMY PARTIAL;  Surgeon: Cristy Bonner DASEN, MD;  Location: [facility] OR;  Service: Orthopedics;  Laterality: Left;    Social History   Socioeconomic History   Marital status: Divorced    Spouse name: Not on file   Number of children: 0   Years of education: Not on file   Highest education level: Not on file  Occupational History   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   Lives alone   (Boyfriend Zachary lives in [place])      Updated 07/2023   Social Drivers of Health   Financial Resource Strain: Not on file  Food Insecurity: Not on file  Transportation Needs: Not on file  Physical Activity: Not on file  Stress: Not on file  Social Connections: Not on file     FAMILY HISTORY:  We obtained a detailed, 4-generation family history.  Significant diagnoses are listed below: Family History  Problem Relation Age of Onset   Hypertension Mother    Arthritis Mother    Breast cancer Mother        breast cancer in her 42's and 36's (bilateral)   Hypertension Father    Other Father        died of pancreatitis   Macular degeneration Sister    Muscular dystrophy Maternal Aunt        adult onset   Diabetes Maternal Aunt    Polymyalgia rheumatica Maternal Aunt    Celiac disease Maternal Uncle    Ovarian cancer Maternal Grandmother    Cervical cancer Maternal  Grandmother    Pancreatic cancer Maternal Grandfather    Cancer Cousin        mouth (nonsmoker)   Ovarian cancer Cousin 48   Arthritis Cousin    Leukemia Nephew 30 - 40       CML     The patient does not have children.  He nephew has CML.  Her mother had breast cancer twice in her 53's-80's.  Her mother's full siblings did not report having cancer, but her paternal half brother had a daughter with ovarian cancer.  The maternal  grandmother had ovarian cancer and the grandfather had pancreatic cancer.  The patient's father died of pancreatis.  He was an only child.  His parents were reported to die of non cancer related issues.  Paige Klein is unaware of previous family history of genetic testing for hereditary cancer risks. There is no reported Jewish ancestry.  GENETIC COUNSELING ASSESSMENT: Paige Klein is a 73 y.o. female with a personal and family history of cancer which is somewhat suggestive of a hereditary cancer syndrome and predisposition to cancer given the combination of cancer in the family. We, therefore, discussed and recommended the following at today's visit.   DISCUSSION: We discussed that, in general, most cancer is not inherited in families, but instead is sporadic or familial. Sporadic cancers occur by chance and typically happen at older ages (>50 years) as this type of cancer is caused by genetic changes acquired during an individual's lifetime. Some families have more cancers than would be expected by chance; however, the ages or types of cancer are not consistent with a known genetic mutation or known genetic mutations have been ruled out. This type of familial cancer is thought to be due to a combination of multiple genetic, environmental, hormonal, and lifestyle factors. While this combination of factors likely increases the risk of cancer, the exact source of this risk is not currently identifiable or testable.  We discussed that 5 - 10% of breast cancer is  hereditary, with most cases associated with BRCA mutations.  There are other genes that can be associated with hereditary breast cancer syndromes.  These include ATM, CHEK2 and PALB2.  We discussed that testing is beneficial for several reasons including knowing how to follow individuals after completing their treatment, identifying whether potential treatment options such as PARP inhibitors would be beneficial, and understand if other family members could be at risk for cancer and allow them to undergo genetic testing.   We reviewed the characteristics, features and inheritance patterns of hereditary cancer syndromes. We also discussed genetic testing, including the appropriate family members to test, the process of testing, insurance coverage and turn-around-time for results. We discussed the implications of a negative, positive and/or variant of uncertain significant result. In order to get genetic test results in a timely manner so that Paige Klein can use these genetic test results for surgical decisions, we recommended Paige Klein pursue genetic testing for the BRCAPlus. Once complete, we recommend Paige Klein pursue reflex genetic testing to the CancerNext-Expanded+RNAinsight gene panel.   The CancerNext-Expanded gene panel offered by [facility] and includes sequencing, rearrangement, and RNA analysis for the following 77 genes: AIP, ALK, APC, ATM, BAP1, BARD1, BMPR1A, BRCA1, BRCA2, BRIP1, CDC73, CDH1, CDK4, CDKN1B, CDKN2A, CEBPA, CHEK2, CTNNA1, DDX41, DICER1, ETV6, FH, FLCN, GATA2, LZTR1, MAX, MBD4, MEN1, MET, MLH1, MSH2, MSH3, MSH6, MUTYH, NF1, NF2, NTHL1, PALB2, PHOX2B, PMS2, POT1, PRKAR1A, PTCH1, PTEN, RAD51C, RAD51D, RB1, RET, RPS20, RUNX1, SDHA, SDHAF2, SDHB, SDHC, SDHD, SMAD4, SMARCA4, SMARCB1, SMARCE1, STK11, SUFU, TMEM127, TP53, TSC1, TSC2, VHL, and WT1 (sequencing and deletion/duplication); AXIN2, CTNNA1, DDX41, EGFR, HOXB13, KIT, MBD4, MITF, MSH3, PDGFRA, POLD1 and POLE (sequencing  only); EPCAM and GREM1 (deletion/duplication only). RNA data is routinely analyzed for use in variant interpretation for all genes.   Based on Paige Klein personal and family history of cancer, she meets medical criteria for genetic testing. Despite that she meets criteria, she may still have an out of pocket cost.   PLAN: After considering the risks, benefits, and limitations, Paige Klein provided informed consent  to pursue genetic testing and the blood sample was sent to [facility] for analysis of the CancerNext-Expanded+RNAinsight. Results should be available within approximately 2-3 weeks' time, at which point they will be disclosed by telephone to Paige Klein, as will any additional recommendations warranted by these results. Paige Klein will receive a summary of her genetic counseling visit and a copy of her results once available. This information will also be available in Epic.   Lastly, we encouraged Paige Klein to remain in contact with cancer genetics annually so that we can continuously update the family history and inform her of any changes in cancer genetics and testing that may be of benefit for this family.   Paige Klein questions were answered to her satisfaction today. Our contact information was provided should additional questions or concerns arise. Thank you for the referral and allowing us  to share in the care of your patient.   Nicolaus Andel P. Perri, MS, CGC Licensed, Patent attorney [email or web address] phone: [phone]  In total, 35 minutes were spent on the date of the encounter in service to the patient including preparation, face-to-face consultation, documentation and care coordination.  The patient was seen alone.  Drs. Lanny Stalls, and/or Gudena were available for questions, if needed..    _______________________________________________________________________ For Office Staff:  Number of people involved in session:  1 Was an Intern/ student involved with case: no

## 2023-11-21 NOTE — Progress Notes (Signed)
 [facility] Clinical Social Work  Initial Assessment   Paige Klein is a 73 y.o. year old female presenting alone. Clinical Social Work was referred by [facility] for assessment of psychosocial needs.   SDOH (Social Determinants of Health) assessments performed: Yes SDOH Interventions    Flowsheet Row Clinical Support from 11/21/2023 in [facility] Med Onc - A Dept Of [place]. [facility]  SDOH Interventions   Food Insecurity Interventions Intervention Not Indicated  Housing Interventions Intervention Not Indicated  Transportation Interventions Intervention Not Indicated  Utilities Interventions Intervention Not Indicated    SDOH Screenings   Food Insecurity: No Food Insecurity (11/21/2023)  Housing: Low Risk  (11/21/2023)  Transportation Needs: No Transportation Needs (11/21/2023)  Utilities: Not At Risk (11/21/2023)  Depression (PHQ2-9): Low Risk  (11/21/2023)  Tobacco Use: Low Risk  (11/21/2023)    PHQ 2/9:    11/21/2023   11:35 AM 07/31/2023   10:02 AM 06/05/2022    9:44 AM  Depression screen PHQ 2/9  Decreased Interest 0 0 0  Down, Depressed, Hopeless 0 0 0  PHQ - 2 Score 0 0 0     Distress Screen completed: No     No data to display            Family/Social Information:  Housing Arrangement: patient lives alone Family members/support persons in your life? Boyfriend (lives in another city), Friends and [employer] concerns: possibly during daily radiation appointments, depending on weather and time of day (has eye issues/glaucoma)  Employment: Retired .  Income source: Actor concerns: No Type of concern: None Food access concerns: no Religious or spiritual practice: [employer] and is involved in Bible study Advanced directives: Scientist, research (physical sciences) Currently in place:  [employer]  Coping/ Adjustment to diagnosis: Patient understands treatment plan and what happens next? yes Concerns  about diagnosis and/or treatment: transportation to appointments potentially Patient reported stressors: Adjusting to my illness Patient enjoys exercise, reading, watching TV, and time with family/ friends Current coping skills/ strengths: Ability for insight , Capable of independent living , Communication skills , Religious Affiliation , Special hobby/interest , and Supportive family/friends     SUMMARY: Current SDOH Barriers:  Transportation- potential concern  Clinical Social Work Clinical Goal(s):  Patient will work with SW to address concerns related to transportation if/when need arises  Interventions: Discussed common feeling and emotions when being diagnosed with cancer, and the importance of support during treatment Informed patient of the support team roles and support services at [facility] Provided CSW contact information and encouraged patient to call with any questions or concerns Provided patient with information about [facility] transportation program    Follow Up Plan: Patient will contact CSW with any support or resource needs. Pt will contact CSW when starting radiation to be referred for transportation Patient verbalizes understanding of plan: Yes    Granvil Djordjevic E Willaim Mode, LCSW Clinical Social Worker [facility]

## 2023-11-21 NOTE — Research (Signed)
 [employer] 2021-05 - Specimen Collection Study to Evaluate Biomarkers in Subjects with Cancer    This Nurse has reviewed this patient's inclusion and exclusion criteria as a second review and confirms Paige Klein is eligible for study participation.  Patient may continue with enrollment.  Andrea MORTON [name], RN, BSN, [facility] She  Her  Hers Clinical Research Nurse [facility] Direct Dial [phone] 11/21/2023 11:21 AM

## 2023-11-21 NOTE — Research (Signed)
 [employer] 2021-05 - Specimen Collection Study to Evaluate Biomarkers in Subjects with Cancer     Patient Paige Klein was identified by Dr. Loretha as a potential candidate for the above listed study.  This Clinical Research Coordinator met with Paige Klein, [id number], on 11/21/23 in a manner and location that ensures patient privacy to discuss participation in the above listed research study.  Patient is Unaccompanied.  A copy of the informed consent document with embedded HIPAA language was provided to the patient.  Patient reads, speaks, and understands [place].    Patient was provided with the business card of this Coordinator and encouraged to contact the research team with any questions.  Patient was provided the option of taking informed consent documents home to review and was encouraged to review at their convenience with their support network, including other care providers. Patient is comfortable with making a decision regarding study participation today.  As outlined in the informed consent form, this Coordinator and [name] discussed the purpose of the research study, the investigational nature of the study, study procedures and requirements for study participation, potential risks and benefits of study participation, as well as alternatives to participation. This study is not blinded. The patient understands participation is voluntary and they may withdraw from study participation at any time.  This study does not involve randomization.  This study does not involve an investigational drug or device. This study does not involve a placebo. Patient understands enrollment is pending full eligibility review.   Confidentiality and how the patient's information will be used as part of study participation were discussed.  Patient was informed there is reimbursement provided for their time and effort spent on trial participation.  The patient is encouraged to  discuss research study participation with their insurance provider to determine what costs they may incur as part of study participation, including research related injury.    All questions were answered to patient's satisfaction.  The informed consent with embedded HIPAA language was reviewed page by page.  The patient's mental and emotional status is appropriate to provide informed consent, and the patient verbalizes an understanding of study participation.  Patient has agreed to participate in the above listed research study and has voluntarily signed the informed consent Revised version 17 Aug 2023 with embedded HIPAA language, version   Revised version 17 Aug 2023 on 11/21/23 at 11:25AM.  The patient was provided with a copy of the signed informed consent form with embedded HIPAA language for their reference.  No study specific procedures were obtained prior to the signing of the informed consent document.  Approximately 15 minutes were spent with the patient reviewing the informed consent documents.  Patient was not requested to complete a Release of Information form.   Eligibility: Eligibility criteria reviewed with patient. This coordinator has reviewed this patient's inclusion and exclusion criteria and confirmed patient is eligible for study participation. Eligibility confirmed by treating investigator, Dr. Loretha, who also agrees that patient should proceed with enrollment.  Patient will continue with enrollment.  Medical History:  This Coordinator reviewed the medical history as reported in the patient's medical record with the participant.  In addition, the participant was asked to report any new medical conditions not previously recorded on the medical history form.   Was the current medical history form correct?   Yes Are there any new medical conditions to report?  No   The Coordinator will add new medical condition/s to the patient's medical history.  Based on the review of the medical  chart and the patient's responses, all reportable medical history events will be entered for study reporting purposes.    Is the patient currently taking a magnesium supplement?   No   Data Collection: Patient was interviewed to collect the following information.  Medical History:  High Blood Pressure  No Coronary Artery Disease No plaque  Lupus    No Rheumatoid Arthritis  No Diabetes   No      Lynch Syndrome  No   Does the patient have a personal history of cancer (greater than 5 years ago)?  No  Does the patient have a family history of cancer in 1st or 2nd degree relatives? Yes If yes, Relationship(s) and Cancer type(s)? Mother-breast cancer 2x maternal grandmother- ovarian/reproductive cancer maternal grandfather- pancreatic cancer  Does the patient have history of alcohol consumption? Yes   If yes, current or former? Current- glass of wine socially  Number of years? Since 18; approx. 55 years Drinks per week? Less than once a week; seldom drink at social occasions only  Does the patient have history of cigarette, cigar, pipe, or chewing tobacco use?  No   Blood Collection: Research blood obtained by fresh venipuncture (or Port a Cath per patient's preference). Patient tolerated well without any adverse events.  Gift Card: $50 gift card given to patient for her participation in this study.    Patient was thanked for their participation in this study.

## 2023-11-21 NOTE — Progress Notes (Signed)
 [facility] CONSULT NOTE  Patient Care Team: Randol Dawes, MD as PCP - General (Family Medicine) Inocencio Soyla Lunger, MD as PCP - Cardiology (Cardiology) Inocencio Soyla Lunger, MD as PCP - Electrophysiology (Cardiology)  CHIEF COMPLAINTS/PURPOSE OF CONSULTATION:  Newly diagnosed breast cancer  HISTORY OF PRESENTING ILLNESS:  Paige Klein 73 y.o. female is here because of recent diagnosis of left breast cancer  I reviewed her records extensively and collaborated the history with the patient.  SUMMARY OF ONCOLOGIC HISTORY: Oncology History  Malignant neoplasm of upper-inner quadrant of left breast in female, estrogen receptor positive (HCC)  11/05/2023 Mammogram   There is a 9 mm irregular mass in the left breast is indeterminate. US  recommended.   11/14/2023 Pathology Results   1. Breast, left, needle core biopsy, 11:00 3cmfn :      - INVASIVE DUCTAL CARCINOMA, SEE NOTE      -  FOCAL DUCTAL CARCINOMA IN SITU, NUCLEAR GRADE 2 OF 3      - TUBULE FORMATION: SCORE 3/3      - NUCLEAR PLEOMORPHISM: SCORE 2/3      - MITOTIC COUNT: SCORE 1/3      - TOTAL SCORE: 6/9      - OVERALL GRADE: II/III      - LYMPHOVASCULAR INVASION: NOT IDENTIFIED      - CANCER LENGTH: 3.5 MM IN GREATEST LINEAR DIMENSION ON HEAVILY FRAGMENTED      CORES.      - CALCIFICATIONS: NOT IDENTIFIED      - OTHER FINDINGS: N/A      NOTE:      DR. [facility] REVIEWED THE CASE AND CONCURS WITH THE INTERPRETATION.  A BREAST      PROGNOSTIC PROFILE (ER, PR, KI-67 AND HER2) IS PENDING AND WILL BE REPORTED IN      AN ADDENDUM.  [facility] WAS NOTIFIED ON 11/15/2023.       2. Breast, left, needle core biopsy, 11:00 1cmfn :      -  BENIGN BREAST TISSUE WITH FIBROCYSTIC CHANGE AND FOCAL USUAL DUCTAL      HYPERPLASIA.  The tumor cells are NEGATIVE for Her2 (1+). Estrogen Receptor:   100%, POSITIVE, STRONG STAINING INTENSITY Progesterone Receptor:  100%, POSITIVE, STRONG STAINING  INTENSITY Proliferation Marker Ki-67:      5%     11/20/2023 Initial Diagnosis   Malignant neoplasm of upper-inner quadrant of left breast in female, estrogen receptor positive (HCC)   11/21/2023 Cancer Staging   Staging form: Breast, AJCC 8th Edition - Clinical stage from 11/21/2023: Stage IA (cT1c, cN0, cM0, G2, ER+, PR+, HER2-) - Signed by Lanell Donald Stagger, PA-C on 11/21/2023 Stage prefix: Initial diagnosis Method of lymph node assessment: Clinical Histologic grading system: 3 grade system     Discussed the use of AI scribe software for clinical note transcription with the patient, who gave verbal consent to proceed.  History of Present Illness Paige Klein is a 73 year old female with invasive ductal carcinoma of the left breast who presents for oncology consultation.  She has invasive ductal carcinoma in the left breast, located at the eleven o'clock position, approximately three centimeters from the nipple. The cancer is intermediate grade and is estrogen and progesterone receptor positive, but HER2 negative. She has a history of multiple breast cysts, which she describes as 'water cysts', and notes that the current lump did not feel like the cysts she was accustomed to.  Her past medical history includes osteoporosis,  atrial fibrillation, glaucoma, and a history of back and knee surgeries. She is currently on Prolia  for osteoporosis and Eliquis  for atrial fibrillation. She has also used bioidentical hormone cream for approximately ten years, stopping in 2019.  Family history is notable for breast cancer in her mother, who had it twice, and other cancers including pancreatic cancer in her maternal grandfather and ovarian cancer in her maternal grandmother. She has one sister and no children.  Socially, she is retired and has a history of working in schools. She reports occasional alcohol consumption, typically around special occasions like her birthday, and has never been  drunk. She is interested in maintaining a healthy diet, focusing on less processed foods and more plant-based options.  MEDICAL HISTORY:  Past Medical History:  Diagnosis Date   A-fib (HCC) 05/22/2017   Abnormal Pap smear of cervix 1999   ASGUS x 1   Allergic rhinitis, cause unspecified    mold; and vasomotor rhinitis   Bradycardia 02/25/2014   Cataract    COVID-19 03/2020   Family history of breast cancer    Family history of ovarian cancer    Family history of pancreatic cancer    Fibrocystic breast changes    and also breast calcifications (08/2008)   Glaucoma    Glaucoma    followed by [facility] Dr. Geneva   Glaucoma suspect of both eyes    high-normal intraocular pressures (Dr. Robinson)   H/O bone density study 09/07/2009   08/16/07, 08/08/05; osteoporosis   H/O mammogram 11/23/2010   normal   Hypercholesteremia    borderline per NMR lipoprofile 2008, 2007   Internal hemorrhoids    Kidney stone 1986   right   Migraine headache    resolved with menopause (menstrual migraines)   MVP (mitral valve prolapse)    per echo   Osteoporosis    Osteoporosis 02/22/2012   Pap smear for cervical cancer screening 04/14/2009   negative for malignancy, +atrophic changes   Postmenopausal    on compounded cream daily   Unspecified vitamin D  deficiency 2009   Vitamin D  deficiency 02/22/2012    SURGICAL HISTORY: Past Surgical History:  Procedure Laterality Date   BREAST CYST ASPIRATION     many (Dr. Debby)   BREAST FIBROADENOMA SURGERY  09-03   Dr.Gooden   cardiolite (stress test)  11/05   Dr. Harl   cataract surgery  2010   bilateral   CERVICAL POLYPECTOMY  2003   treated with cryosurgery   COLONOSCOPY  08/02/11; 05/2000   Dr. Kristie - rectal polyps 2013   DILATION AND CURETTAGE OF UTERUS  05/2009   uterine polyp, bleeding   endocervical cyst  12/96, 6/97   Dr Darina   EYE SURGERY     KYPHOPLASTY N/A 04/29/2015   Procedure: KYPHOPLASTY;  Surgeon: Oneil Priestly, MD;  Location: [facility] OR;   Service: Orthopedics;  Laterality: N/A;  Lumbar 1 kyphoplasty   PATELLAR TENDON REPAIR Left 10/27/2020   Procedure: PATELLA TENDON REPAIR;  Surgeon: Cristy Bonner DASEN, MD;  Location: [facility] OR;  Service: Orthopedics;  Laterality: Left;   PATELLECTOMY Left 10/27/2020   Procedure: PATELLECTOMY PARTIAL;  Surgeon: Cristy Bonner DASEN, MD;  Location: [facility] OR;  Service: Orthopedics;  Laterality: Left;    SOCIAL HISTORY: Social History   Socioeconomic History   Marital status: Divorced    Spouse name: Not on file   Number of children: 0   Years of education: Not on file   Highest education level: Not on file  Occupational History   Occupation: Social research officer, 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   Lives alone   (Boyfriend Zachary lives in [place])      Updated 07/2023   Social Drivers of Health   Financial Resource Strain: Not on file  Food Insecurity: No Food Insecurity (11/21/2023)   Hunger Vital Sign    Worried About Running Out of Food in the Last Year: Never true    Ran Out of Food in the Last Year: Never true  Transportation Needs: No Transportation Needs (11/21/2023)   PRAPARE - Administrator, Civil Service (Medical): No    Lack of Transportation (Non-Medical): No  Physical Activity: Not on file  Stress: Not on file  Social Connections: Not on file  Intimate Partner Violence: Not At Risk (11/21/2023)   Humiliation, Afraid, Rape, and Kick questionnaire    Fear of Current or Ex-Partner: No    Emotionally Abused: No    Physically Abused:  No    Sexually Abused: No    FAMILY HISTORY: Family History  Problem Relation Age of Onset   Hypertension Mother    Arthritis Mother    Breast cancer Mother        breast cancer in her 54's and 59's (bilateral)   Hypertension Father    Other Father        died of pancreatitis   Macular degeneration Sister    Muscular dystrophy Maternal Aunt        adult onset   Diabetes Maternal Aunt    Polymyalgia rheumatica Maternal Aunt    Celiac disease Maternal Uncle    Ovarian cancer Maternal Grandmother    Cervical cancer Maternal Grandmother    Pancreatic cancer Maternal Grandfather    Cancer Cousin        mouth (nonsmoker)   Ovarian cancer Cousin 48   Arthritis Cousin    Leukemia Nephew 30 - 40       CML    ALLERGIES:  is allergic to brinzolamide-brimonidine, cortisone, fosamax  [alendronate  sodium], timolol, and tramadol .  MEDICATIONS:  Current Outpatient Medications  Medication Sig Dispense Refill   apixaban  (ELIQUIS ) 5 MG TABS tablet TAKE 1 TABLET BY MOUTH TWICE A DAY 180 tablet 1   denosumab  (PROLIA ) 60 MG/ML SOLN injection Inject 60 mg into the skin every 6 (six) months. Administer in upper arm, thigh, or abdomen     dorzolamide  (TRUSOPT ) 2 % ophthalmic solution Place 1 drop into both eyes 3 (three) times daily.  3   latanoprost  (XALATAN ) 0.005 % ophthalmic solution Place 1 drop into both eyes at bedtime.     pilocarpine  (PILOCAR) 4 % ophthalmic solution Place 1 drop into both eyes 3 (three) times daily.     rosuvastatin  (CRESTOR ) 20 MG tablet TAKE 1 TABLET (20 MG TOTAL) BY MOUTH 2 DAYS PER WEEK 25 tablet 2   acetaminophen  (TYLENOL ) 500 MG tablet Take 2 tablets (1,000 mg total) by mouth every 8 (eight) hours as needed. (  Patient not taking: Reported on 07/31/2023) 30 tablet 0   calcium  citrate-vitamin D  (CITRACAL+D) 315-200 MG-UNIT tablet Take 1 tablet by mouth at bedtime.     diltiazem  (CARDIZEM ) 30 MG tablet Take 30 mg by mouth as needed (a-fib). (Patient not taking: Reported  on 07/31/2023)     LUTEIN PO Take 1 capsule by mouth at bedtime.     Multiple Vitamins-Minerals (CENTRUM SILVER PO) Take 1 tablet by mouth daily.     Olopatadine  HCl (PATADAY  OP) Apply 1 drop to eye as directed.     No current facility-administered medications for this visit.    REVIEW OF SYSTEMS:   Constitutional: Denies fevers, chills or abnormal night sweats Eyes: Denies blurriness of vision, double vision or watery eyes Ears, nose, mouth, throat, and face: Denies mucositis or sore throat Respiratory: Denies cough, dyspnea or wheezes Cardiovascular: Denies palpitation, chest discomfort or lower extremity swelling Gastrointestinal:  Denies nausea, heartburn or change in bowel habits Skin: Denies abnormal skin rashes Lymphatics: Denies new lymphadenopathy or easy bruising Neurological:Denies numbness, tingling or new weaknesses Behavioral/Psych: Mood is stable, no new changes  Breast: Denies any palpable lumps or discharge All other systems were reviewed with the patient and are negative.  PHYSICAL EXAMINATION: ECOG PERFORMANCE STATUS: 0 - Asymptomatic  Vitals:   11/21/23 0851  BP: (!) 139/41  Pulse: 62  Resp: 18  Temp: 97.7 F (36.5 C)  SpO2: 100%   Filed Weights   11/21/23 0851  Weight: 137 lb 4.8 oz (62.3 kg)    GENERAL:alert, no distress and comfortable Retroareolar mass left breast measuring about 1cm No regional adenopathy  LABORATORY DATA:  I have reviewed the data as listed Lab Results  Component Value Date   WBC 5.4 07/31/2023   HGB 13.4 07/31/2023   HCT 41.1 07/31/2023   MCV 97 07/31/2023   PLT 311 07/31/2023   Lab Results  Component Value Date   NA 141 07/31/2023   K 4.5 07/31/2023   CL 105 07/31/2023   CO2 22 07/31/2023    RADIOGRAPHIC STUDIES: I have personally reviewed the radiological reports and agreed with the findings in the report.  ASSESSMENT AND PLAN:   Assessment and Plan Assessment & Plan Invasive ductal carcinoma of left  breast, ER/PR positive, HER2 negative Intermediate grade, hormone receptor-positive, HER2 negative.  Given strongly pos ER PR receptor, Her 2 negative and low proliferation index, we discussed about proceeding with surgery and omitting oncotype. I do not suspect that her oncotype score will be high. She is ok with this plan. - Proceed with lumpectomy. - Follow with radiation therapy. - Initiate aromatase inhibitor therapy post-radiation with anastrozole or letrozole. We have discussed options for antiestrogen therapy today With regards to aromatase inhibitors, we discussed mechanism of action, adverse effects including but not limited to post menopausal symptoms, arthralgias, myalgias, increased risk of cardiovascular events and bone loss.  Tamoxifen can be used in premenopausal and post menopausal women. Aromatase inhibitors can only be used in premenopausal women along with OFS. - Monitor bone density while on aromatase inhibitors. - Discuss genetic testing due to family history.  Osteoporosis Managed with Prolia  injections. Long-term bone density medication expected to mitigate aromatase inhibitor effects. - Continue Prolia  injections. - Monitor bone density regularly.   All questions were answered. The patient knows to call the clinic with any problems, questions or concerns.    Amber Stalls, MD 11/21/23

## 2023-11-21 NOTE — Telephone Encounter (Signed)
   Pre-operative Risk Assessment    Patient Name: Paige Klein  DOB: 21-Jun-1950 MRN: [id number]   Date of last office visit: 07/10/23 DR. CAMNITZ Date of next office visit: NONE   Request for Surgical Clearance    Procedure:  LEFT BREAST LUMPECTOMY  Date of Surgery:  Clearance TBD                                Surgeon:  DR. JINA NEPHEW Surgeon's Group or Practice Name:  [facility] Phone number:  [phone] Fax number:  [phone] KELSEY PHILLIPS, CMA   Type of Clearance Requested:   - Medical  - Pharmacy:  Hold Apixaban  (Eliquis )     Type of Anesthesia:  General    Additional requests/questions:    [name]   11/21/2023, 4:05 PM

## 2023-11-22 ENCOUNTER — Telehealth: Payer: Self-pay | Admitting: *Deleted

## 2023-11-22 NOTE — Telephone Encounter (Signed)
 Pt has been added on per Barnie Hila, NP due to type of procedure needing to be done. Med rec and consent are done.

## 2023-11-22 NOTE — Telephone Encounter (Signed)
   Name: Paige Klein  DOB: 1951-01-09  MRN: [id number]  Primary Cardiologist: Will Gladis Norton, MD   Preoperative team, please contact this patient and set up a phone call appointment for further preoperative risk assessment. Please obtain consent and complete medication review. Thank you for your help.  Okay to schedule for Monday 9/8 or Tuesday 9/9.  I confirm that guidance regarding antiplatelet and oral anticoagulation therapy has been completed and, if necessary, noted below.  Per Pharm D, patient has not had an Afib/aflutter ablation within the last 3 months or DCCV within the last 30 days. Patient may hold Eliquis  for 2 days prior to procedure.  Patient will not need bridging with Lovenox  around procedure.    I also confirmed the patient resides in the state of [place] . As per [facility] [employer] telemedicine laws, the patient must reside in the state in which the provider is licensed.    Barnie Hila, NP 11/22/2023, 12:00 PM [facility]

## 2023-11-22 NOTE — Telephone Encounter (Signed)
 Pt has been added on per Barnie Hila, NP due to type of procedure needing to be done. Med rec and consent are done.     Patient Consent for Virtual Visit        Paige Klein has provided verbal consent on 11/22/2023 for a virtual visit (video or telephone).   CONSENT FOR VIRTUAL VISIT FOR:  Paige Klein  By participating in this virtual visit I agree to the following:  I hereby voluntarily request, consent and authorize [facility] and its employed or contracted physicians, physician assistants, nurse practitioners or other licensed health care professionals (the Practitioner), to provide me with telemedicine health care services (the "Services) as deemed necessary by the treating Practitioner. I acknowledge and consent to receive the Services by the Practitioner via telemedicine. I understand that the telemedicine visit will involve communicating with the Practitioner through live audiovisual communication technology and the disclosure of certain medical information by electronic transmission. I acknowledge that I have been given the opportunity to request an in-person assessment or other available alternative prior to the telemedicine visit and am voluntarily participating in the telemedicine visit.  I understand that I have the right to withhold or withdraw my consent to the use of telemedicine in the course of my care at any time, without affecting my right to future care or treatment, and that the Practitioner or I may terminate the telemedicine visit at any time. I understand that I have the right to inspect all information obtained and/or recorded in the course of the telemedicine visit and may receive copies of available information for a reasonable fee.  I understand that some of the potential risks of receiving the Services via telemedicine include:  Delay or interruption in medical evaluation due to technological equipment failure or  disruption; Information transmitted may not be sufficient (e.g. poor resolution of images) to allow for appropriate medical decision making by the Practitioner; and/or  In rare instances, security protocols could fail, causing a breach of personal health information.  Furthermore, I acknowledge that it is my responsibility to provide information about my medical history, conditions and care that is complete and accurate to the best of my ability. I acknowledge that Practitioner's advice, recommendations, and/or decision may be based on factors not within their control, such as incomplete or inaccurate data provided by me or distortions of diagnostic images or specimens that may result from electronic transmissions. I understand that the practice of medicine is not an exact science and that Practitioner makes no warranties or guarantees regarding treatment outcomes. I acknowledge that a copy of this consent can be made available to me via my patient portal [facility] MyChart), or I can request a printed copy by calling the office of [facility].    I understand that my insurance will be billed for this visit.   I have read or had this consent read to me. I understand the contents of this consent, which adequately explains the benefits and risks of the Services being provided via telemedicine.  I have been provided ample opportunity to ask questions regarding this consent and the Services and have had my questions answered to my satisfaction. I give my informed consent for the services to be provided through the use of telemedicine in my medical care

## 2023-11-22 NOTE — Telephone Encounter (Signed)
 Patient with diagnosis of atrial fibrillation on Elliquis for anticoagulation.    Procedure:  LEFT BREAST LUMPECTOMY   Date of Surgery:  Clearance TBD    CHA2DS2-VASc Score = 2   This indicates a 2.2% annual risk of stroke. The patient's score is based upon: CHF History: 0 HTN History: 0 Diabetes History: 0 Stroke History: 0 Vascular Disease History: 0 Age Score: 1 Gender Score: 1      CrCl 62 Platelet count 275   Patient has not had an Afib/aflutter ablation within the last 3 months or DCCV within the last 30 days  Per office protocol, patient can hold Eliquis  for 2 days prior to procedure.   Patient will not need bridging with Lovenox  (enoxaparin ) around procedure.  **This guidance is not considered finalized until pre-operative APP has relayed final recommendations.**

## 2023-11-23 ENCOUNTER — Other Ambulatory Visit: Payer: Self-pay | Admitting: *Deleted

## 2023-11-23 DIAGNOSIS — Z170 Estrogen receptor positive status [ER+]: Secondary | ICD-10-CM

## 2023-11-27 ENCOUNTER — Ambulatory Visit: Attending: Cardiology

## 2023-11-27 DIAGNOSIS — Z01810 Encounter for preprocedural cardiovascular examination: Secondary | ICD-10-CM

## 2023-11-27 NOTE — Progress Notes (Signed)
 Virtual Visit via Telephone Note   Because of Paige Klein co-morbid illnesses, she is at least at moderate risk for complications without adequate follow up.  This format is felt to be most appropriate for this patient at this time.  Due to technical limitations with video connection Web designer), today's appointment will be conducted as an audio only telehealth visit, and Paige Klein verbally agreed to proceed in this manner.   All issues noted in this document were discussed and addressed.  No physical exam could be performed with this format.  Evaluation Performed:  Preoperative cardiovascular risk assessment _____________   Date:  11/27/2023   Patient ID:  Paige Klein, DOB 08/10/1950, MRN [id number] Patient Location:  Home Provider location:   Office  Primary Care Provider:  Randol Dawes, MD Primary Cardiologist:  Will Gladis Norton, MD  Chief Complaint / Patient Profile   73 y.o. y/o female with a h/o atrial fibrillation, aortic atherosclerosis who is pending lumpectomy and presents today for telephonic preoperative cardiovascular risk assessment.  History of Present Illness    Paige Klein is a 73 y.o. female who presents via audio/video conferencing for a telehealth visit today.  Pt was last seen in cardiology clinic on 07/10/2023 by Dr. Norton.  At that time Paige Klein was doing well .  The patient is now pending procedure as outlined above. Since her last visit, she continues to be stable from a cardiac standpoint.  Today she denies chest pain, shortness of breath, lower extremity edema, fatigue, melena, hematuria, hemoptysis, diaphoresis, weakness, presyncope, syncope, orthopnea, and PND.   Past Medical History    Past Medical History:  Diagnosis Date   A-fib (HCC) 05/22/2017   Abnormal Pap smear of cervix 1999   ASGUS x 1   Allergic rhinitis, cause unspecified    mold; and vasomotor rhinitis   Bradycardia 02/25/2014    Cataract    COVID-19 03/2020   Family history of breast cancer    Family history of ovarian cancer    Family history of pancreatic cancer    Fibrocystic breast changes    and also breast calcifications (08/2008)   Glaucoma    Glaucoma    followed by [facility] Dr. Geneva   Glaucoma suspect of both eyes    high-normal intraocular pressures (Dr. Robinson)   H/O bone density study 09/07/2009   08/16/07, 08/08/05; osteoporosis   H/O mammogram 11/23/2010   normal   Hypercholesteremia    borderline per NMR lipoprofile 2008, 2007   Internal hemorrhoids    Kidney stone 1986   right   Migraine headache    resolved with menopause (menstrual migraines)   MVP (mitral valve prolapse)    per echo   Osteoporosis    Osteoporosis 02/22/2012   Pap smear for cervical cancer screening 04/14/2009   negative for malignancy, +atrophic changes   Postmenopausal    on compounded cream daily   Unspecified vitamin D  deficiency 2009   Vitamin D  deficiency 02/22/2012   Past Surgical History:  Procedure Laterality Date   BREAST CYST ASPIRATION     many (Dr. Debby)   BREAST FIBROADENOMA SURGERY  09-03   Dr.Gooden   cardiolite (stress test)  11/05   Dr. Harl   cataract surgery  2010   bilateral   CERVICAL POLYPECTOMY  2003   treated with cryosurgery   COLONOSCOPY  08/02/11; 05/2000   Dr. Kristie - rectal polyps 2013   DILATION AND CURETTAGE OF UTERUS  05/2009  uterine polyp, bleeding   endocervical cyst  12/96, 6/97   Dr Darina   EYE SURGERY     KYPHOPLASTY N/A 04/29/2015   Procedure: KYPHOPLASTY;  Surgeon: Oneil Priestly, MD;  Location: [facility] OR;  Service: Orthopedics;  Laterality: N/A;  Lumbar 1 kyphoplasty   PATELLAR TENDON REPAIR Left 10/27/2020   Procedure: PATELLA TENDON REPAIR;  Surgeon: Cristy Bonner DASEN, MD;  Location: [facility] OR;  Service: Orthopedics;  Laterality: Left;   PATELLECTOMY Left 10/27/2020   Procedure: PATELLECTOMY PARTIAL;  Surgeon: Cristy Bonner DASEN, MD;  Location: [facility] OR;  Service: Orthopedics;   Laterality: Left;    Allergies  Allergies  Allergen Reactions   Brinzolamide-Brimonidine Itching   Cortisone Other (See Comments)    Entire body hurt from a cortisone shot.   Fosamax  [Alendronate  Sodium]     GI upset, bone pain; tolerated Actonel   Timolol Other (See Comments)    Slowed her heart rate down   Tramadol  Rash    Home Medications    Prior to Admission medications   Medication Sig Start Date End Date Taking? Authorizing Provider  acetaminophen  (TYLENOL ) 500 MG tablet Take 2 tablets (1,000 mg total) by mouth every 8 (eight) hours as needed. Patient not taking: Reported on 11/22/2023 10/29/20   Maczis, Michael M, PA-C  apixaban  (ELIQUIS ) 5 MG TABS tablet TAKE 1 TABLET BY MOUTH TWICE A DAY 10/15/23   Camnitz, Soyla Lunger, MD  calcium  citrate-vitamin D  (CITRACAL+D) 315-200 MG-UNIT tablet Take 1 tablet by mouth at bedtime.    [provider]  denosumab  (PROLIA ) 60 MG/ML SOLN injection Inject 60 mg into the skin every 6 (six) months. Administer in upper arm, thigh, or abdomen    [provider]  diltiazem  (CARDIZEM ) 30 MG tablet Take 30 mg by mouth as needed (a-fib).    [provider]  dorzolamide  (TRUSOPT ) 2 % ophthalmic solution Place 1 drop into both eyes 3 (three) times daily. 01/20/15   [provider]  latanoprost  (XALATAN ) 0.005 % ophthalmic solution Place 1 drop into both eyes at bedtime.    [provider]  LUTEIN PO Take 1 capsule by mouth at bedtime.    [provider]  Multiple Vitamins-Minerals (CENTRUM SILVER PO) Take 1 tablet by mouth daily.    [provider]  Olopatadine  HCl (PATADAY  OP) Apply 1 drop to eye as directed.    [provider]  pilocarpine  (PILOCAR) 4 % ophthalmic solution Place 1 drop into both eyes 3 (three) times daily.    [provider]  rosuvastatin  (CRESTOR ) 20 MG tablet TAKE 1 TABLET (20 MG TOTAL) BY MOUTH 2 DAYS PER WEEK 11/03/23   Randol Dawes, MD    Physical Exam     Vital Signs:  Paige Klein does not have vital signs available for review today.  Given telephonic nature of communication, physical exam is limited. AAOx3. NAD. Normal affect.  Speech and respirations are unlabored.  Accessory Clinical Findings    None  Assessment & Plan    1.  Preoperative Cardiovascular Risk Assessment: LEFT BREAST LUMPECTOMY   Date of Surgery:  Clearance TBD                                  Surgeon:  DR. JINA NEPHEW Surgeon's Group or Practice Name:  [employer] number:  [phone] Fax number:  [phone]      Primary Cardiologist: Will Lunger Norton, MD  Chart reviewed as part of pre-operative protocol coverage. Given past medical history and time since last visit, based on ACC/AHA guidelines, Paige Klein would be at acceptable risk for the planned procedure without further cardiovascular testing.   Her RCRI is very low risk, 0.4% risk of major cardiac event.  She is able to complete greater than 4 METS of physical activity.  Patient was advised that if she develops new symptoms prior to surgery to contact our office to arrange a follow-up appointment.  She verbalized understanding.  Her Eliquis  may be held for 2 days prior to her procedure.  She will not require bridging with Lovenox  surrounding procedure.  I will route this recommendation to the requesting party via Epic fax function and remove from pre-op pool.       Time:   Today, I have spent 5 minutes with the patient with telehealth technology discussing medical history, symptoms, and management plan. I spent 10 minutes reviewing patient's past cardiac history and cardiac medications.     Paige CHRISTELLA Beauvais, NP  11/27/2023, 7:34 AM

## 2023-11-29 ENCOUNTER — Telehealth: Payer: Self-pay | Admitting: *Deleted

## 2023-11-29 ENCOUNTER — Encounter: Payer: Self-pay | Admitting: *Deleted

## 2023-11-29 NOTE — Telephone Encounter (Signed)
 Left message for patient following up from clinic last week. Left navigator's number for return call if she has any needs/concerns.

## 2023-11-30 ENCOUNTER — Telehealth: Payer: Self-pay | Admitting: Genetic Counselor

## 2023-11-30 NOTE — Telephone Encounter (Signed)
 Revealed negative genetic testing on the BRCAPlus panel.  The remainder of testing is pending.  We will call when it is available.

## 2023-12-05 ENCOUNTER — Encounter: Payer: Self-pay | Admitting: Genetic Counselor

## 2023-12-05 ENCOUNTER — Encounter: Payer: Self-pay | Admitting: Diagnostic Radiology

## 2023-12-05 DIAGNOSIS — Z1379 Encounter for other screening for genetic and chromosomal anomalies: Secondary | ICD-10-CM | POA: Insufficient documentation

## 2023-12-06 ENCOUNTER — Other Ambulatory Visit: Payer: Self-pay

## 2023-12-06 ENCOUNTER — Encounter (HOSPITAL_BASED_OUTPATIENT_CLINIC_OR_DEPARTMENT_OTHER): Payer: Self-pay | Admitting: General Surgery

## 2023-12-06 MED ORDER — CHLORHEXIDINE GLUCONATE CLOTH 2 % EX PADS: 6.0000 | MEDICATED_PAD | Freq: Once | CUTANEOUS | Status: DC

## 2023-12-06 NOTE — Progress Notes (Signed)

## 2023-12-10 ENCOUNTER — Telehealth: Payer: Self-pay | Admitting: Genetic Counselor

## 2023-12-10 ENCOUNTER — Ambulatory Visit: Payer: Self-pay | Admitting: Genetic Counselor

## 2023-12-10 DIAGNOSIS — Z1379 Encounter for other screening for genetic and chromosomal anomalies: Secondary | ICD-10-CM

## 2023-12-10 DIAGNOSIS — C50212 Malignant neoplasm of upper-inner quadrant of left female breast: Secondary | ICD-10-CM | POA: Diagnosis not present

## 2023-12-10 NOTE — Telephone Encounter (Signed)
 I contacted  Ms. Vilar to discuss her genetic testing results. No pathogenic variants were identified in the 77 genes analyzed. Discussed that we do not know why she has breast cancer or why there is cancer in the family. It could be due to a different gene that we are not testing, or maybe our current technology may not be able to pick something up.  It will be important for her to keep in contact with genetics to keep up with whether additional testing may be needed.Detailed clinic note to follow.   The test report will be scanned into EPIC and will be located under the Molecular Pathology section of the Results Review tab.  A portion of the result report is included below for reference.

## 2023-12-10 NOTE — Progress Notes (Signed)
 HPI:  Paige Klein was previously seen in the [facility] due to a personal and family history of cancer and concerns regarding a hereditary predisposition to cancer. Please refer to our prior cancer genetics clinic note for more information regarding our discussion, assessment and recommendations, at the time. Paige Klein recent genetic test results were disclosed to her, as were recommendations warranted by these results. These results and recommendations are discussed in more detail below.  CANCER HISTORY:  Oncology History  Malignant neoplasm of upper-inner quadrant of left breast in female, estrogen receptor positive (HCC)  11/05/2023 Mammogram   There is a 9 mm irregular mass in the left breast is indeterminate. US  recommended.   11/14/2023 Pathology Results   1. Breast, left, needle core biopsy, 11:00 3cmfn :      - INVASIVE DUCTAL CARCINOMA, SEE NOTE      -  FOCAL DUCTAL CARCINOMA IN SITU, NUCLEAR GRADE 2 OF 3      - TUBULE FORMATION: SCORE 3/3      - NUCLEAR PLEOMORPHISM: SCORE 2/3      - MITOTIC COUNT: SCORE 1/3      - TOTAL SCORE: 6/9      - OVERALL GRADE: II/III      - LYMPHOVASCULAR INVASION: NOT IDENTIFIED      - CANCER LENGTH: 3.5 MM IN GREATEST LINEAR DIMENSION ON HEAVILY FRAGMENTED      CORES.      - CALCIFICATIONS: NOT IDENTIFIED      - OTHER FINDINGS: N/A      NOTE:      DR. [facility] REVIEWED THE CASE AND CONCURS WITH THE INTERPRETATION.  A BREAST      PROGNOSTIC PROFILE (ER, PR, KI-67 AND HER2) IS PENDING AND WILL BE REPORTED IN      AN ADDENDUM.  [facility] WAS NOTIFIED ON 11/15/2023.       2. Breast, left, needle core biopsy, 11:00 1cmfn :      -  BENIGN BREAST TISSUE WITH FIBROCYSTIC CHANGE AND FOCAL USUAL DUCTAL      HYPERPLASIA.  The tumor cells are NEGATIVE for Her2 (1+). Estrogen Receptor:   100%, POSITIVE, STRONG STAINING INTENSITY Progesterone Receptor:  100%, POSITIVE, STRONG STAINING INTENSITY Proliferation Marker  Ki-67:      5%     11/20/2023 Initial Diagnosis   Malignant neoplasm of upper-inner quadrant of left breast in female, estrogen receptor positive (HCC)   11/21/2023 Cancer Staging   Staging form: Breast, AJCC 8th Edition - Clinical stage from 11/21/2023: Stage IA (cT1c, cN0, cM0, G2, ER+, PR+, HER2-) - Signed by Lanell Donald Stagger, PA-C on 11/21/2023 Stage prefix: Initial diagnosis Method of lymph node assessment: Clinical Histologic grading system: 3 grade system   12/05/2023 Genetic Testing   Negative genetic testing on the CancerNext-Expanded+RNAinsight panel.  The report date is 12/05/2023.  The CancerNext-Expanded gene panel offered by [facility] and includes sequencing, rearrangement, and RNA analysis for the following 77 genes: AIP, ALK, APC, ATM, BAP1, BARD1, BMPR1A, BRCA1, BRCA2, BRIP1, CDC73, CDH1, CDK4, CDKN1B, CDKN2A, CEBPA, CHEK2, CTNNA1, DDX41, DICER1, ETV6, FH, FLCN, GATA2, LZTR1, MAX, MBD4, MEN1, MET, MLH1, MSH2, MSH3, MSH6, MUTYH, NF1, NF2, NTHL1, PALB2, PHOX2B, PMS2, POT1, PRKAR1A, PTCH1, PTEN, RAD51C, RAD51D, RB1, RET, RPS20, RUNX1, SDHA, SDHAF2, SDHB, SDHC, SDHD, SMAD4, SMARCA4, SMARCB1, SMARCE1, STK11, SUFU, TMEM127, TP53, TSC1, TSC2, VHL, and WT1 (sequencing and deletion/duplication); AXIN2, CTNNA1, DDX41, EGFR, HOXB13, KIT, MBD4, MITF, MSH3, PDGFRA, POLD1 and POLE (sequencing only); EPCAM and  GREM1 (deletion/duplication only). RNA data is routinely analyzed for use in variant interpretation for all genes.      FAMILY HISTORY:  We obtained a detailed, 4-generation family history.  Significant diagnoses are listed below: Family History  Problem Relation Age of Onset   Hypertension Mother    Arthritis Mother    Breast cancer Mother        breast cancer in her 58's and 71's (bilateral)   Hypertension Father    Other Father        died of pancreatitis   Macular degeneration Sister    Muscular dystrophy Maternal Aunt        adult onset   Diabetes Maternal Aunt     Polymyalgia rheumatica Maternal Aunt    Celiac disease Maternal Uncle    Ovarian cancer Maternal Grandmother    Cervical cancer Maternal Grandmother    Pancreatic cancer Maternal Grandfather    Cancer Cousin        mouth (nonsmoker)   Ovarian cancer Cousin 48   Arthritis Cousin    Leukemia Nephew 30 - 40       CML       The patient does not have children.  He nephew has CML.  Her mother had breast cancer twice in her 35's-80's.  Her mother's full siblings did not report having cancer, but her paternal half brother had a daughter with ovarian cancer.  The maternal grandmother had ovarian cancer and the grandfather had pancreatic cancer.   The patient's father died of pancreatis.  He was an only child.  His parents were reported to die of non cancer related issues.   Paige Klein is unaware of previous family history of genetic testing for hereditary cancer risks. There is no reported Jewish ancestry.  GENETIC TEST RESULTS: Genetic testing reported out on December 05, 2023 through the CancerNext-Expanded+RNAinsight cancer panel found no pathogenic mutations. The CancerNext-Expanded gene panel offered by [facility] and includes sequencing, rearrangement, and RNA analysis for the following 77 genes: AIP, ALK, APC, ATM, BAP1, BARD1, BMPR1A, BRCA1, BRCA2, BRIP1, CDC73, CDH1, CDK4, CDKN1B, CDKN2A, CEBPA, CHEK2, CTNNA1, DDX41, DICER1, ETV6, FH, FLCN, GATA2, LZTR1, MAX, MBD4, MEN1, MET, MLH1, MSH2, MSH3, MSH6, MUTYH, NF1, NF2, NTHL1, PALB2, PHOX2B, PMS2, POT1, PRKAR1A, PTCH1, PTEN, RAD51C, RAD51D, RB1, RET, RPS20, RUNX1, SDHA, SDHAF2, SDHB, SDHC, SDHD, SMAD4, SMARCA4, SMARCB1, SMARCE1, STK11, SUFU, TMEM127, TP53, TSC1, TSC2, VHL, and WT1 (sequencing and deletion/duplication); AXIN2, CTNNA1, DDX41, EGFR, HOXB13, KIT, MBD4, MITF, MSH3, PDGFRA, POLD1 and POLE (sequencing only); EPCAM and GREM1 (deletion/duplication only). RNA data is routinely analyzed for use in variant interpretation for all genes.  The test report has been scanned into EPIC and is located under the Molecular Pathology section of the Results Review tab.  A portion of the result report is included below for reference.     We discussed with Paige Klein that because current genetic testing is not perfect, it is possible there may be a gene mutation in one of these genes that current testing cannot detect, but that chance is small.  We also discussed, that there could be another gene that has not yet been discovered, or that we have not yet tested, that is responsible for the cancer diagnoses in the family. It is also possible there is a hereditary cause for the cancer in the family that Paige Klein did not inherit and therefore was not identified in her testing.  Therefore, it is important to remain in touch with cancer genetics in  the future so that we can continue to offer Paige Klein the most up to date genetic testing.   ADDITIONAL GENETIC TESTING: We discussed with Paige Klein that her genetic testing was fairly extensive.  If there are genes identified to increase cancer risk that can be analyzed in the future, we would be happy to discuss and coordinate this testing at that time.    CANCER SCREENING RECOMMENDATIONS: Paige Klein test result is considered negative (normal).  This means that we have not identified a hereditary cause for her personal and family history of cancer at this time. Most cancers happen by chance and this negative test suggests that her personal and family history of cancer may fall into this category.    Possible reasons for Paige Klein's negative genetic test include:  1. There may be a gene mutation in one of these genes that current testing methods cannot detect but that chance is small.  2. There could be another gene that has not yet been discovered, or that we have not yet tested, that is responsible for the cancer diagnoses in the family.  3.  There may be no hereditary risk for  cancer in the family. The cancers in Paige Klein and/or her family may be sporadic/familial or due to other genetic and environmental factors. 4. It is also possible there is a hereditary cause for the cancer in the family that Paige Klein did not inherit.  Therefore, it is recommended she continue to follow the cancer management and screening guidelines provided by her oncology and primary healthcare provider. An individual's cancer risk and medical management are not determined by genetic test results alone. Overall cancer risk assessment incorporates additional factors, including personal medical history, family history, and any available genetic information that may result in a personalized plan for cancer prevention and surveillance  RECOMMENDATIONS FOR FAMILY MEMBERS:   Since she did not inherit a identifiable mutation in a cancer predisposition gene included on this panel, her children could not have inherited a known mutation from her in one of these genes. Individuals in this family might be at some increased risk of developing cancer, over the general population risk, simply due to the family history of cancer.  We recommended women in this family have a yearly mammogram beginning at age 19, or 67 years younger than the earliest onset of cancer, an annual clinical breast exam, and perform monthly breast self-exams. Women in this family should also have a gynecological exam as recommended by their primary provider. All family members should be referred for colonoscopy starting at age 68, or 3 years younger than the earliest onset of cancer.  FOLLOW-UP: Lastly, we discussed with Paige Klein that cancer genetics is a rapidly advancing field and it is possible that new genetic tests will be appropriate for her and/or her family members in the future. We encouraged her to remain in contact with cancer genetics on an annual basis so we can update her personal and family histories and let her know  of advances in cancer genetics that may benefit this family.   Our contact number was provided. Paige Klein questions were answered to her satisfaction, and she knows she is welcome to call us  at anytime with additional questions or concerns.   Darice Monte, MS, [facility] Licensed, Certified Genetic Counselor [email or web address]

## 2023-12-11 NOTE — H&P (Signed)
 REFERRING PHYSICIAN:  Vonzell     PROVIDER:  JINA CLAIR NEPHEW, MD   Care Team: Patient Care Team: Randol [name] LABOR, MD as PCP - General (Family Medicine) Signe Mitzie Palma, MD as Consulting Provider (General Surgery) NEPHEW JINA CLAIR, MD as Consulting Provider (Surgical Oncology) Iruku, Linnette Lane Dice, MD as Referring Physician (Hematology and Oncology) Dewey Norleen Hamilton, MD as Referring Physician (Radiation Oncology) Vonzell Rosina Moats, MD as Referring Physician (Radiology)    MRN: [id number] DOB: March 13, 1951 DATE OF ENCOUNTER: 11/21/2023   Subjective    Chief Complaint: Breast Cancer       History of Present Illness: Paige Klein is a 73 y.o. female who is seen today as an office consultation at the request of Dr. NEPHEW for evaluation of Breast Cancer .     Patient presents with a new diagnosis of left breast cancer August 2025.  Patient was found to have a screening detected focal asymmetry.  Diagnostic imaging was performed.  There was a irregular mass in the left breast centrally found on diagnostic mammography and then an ultrasound was performed.  The ultrasound showed a 1.1 cm mass at 11 o'clock.  Due to the breast density, a contrast-enhanced mammogram was also performed on the day of the biopsy.  On contrast-enhanced mammogram was seen to be 1.1 cm and a second area was seen also at 11 o'clock.  Both of these were biopsied.  The second area that was close to the nipple was benign.  The original lesion was seen to be a grade 2 invasive ductal carcinoma with DCIS.  This was hormone receptor positive, HER2 negative.  Ki-67 was 5%.       Family cancer history -patient does have a significant family cancer history with a mother having breast cancer twice but living to be in her 30s.  Maternal grandmother had ovarian cancer, maternal grandfather had pancreatic cancer, maternal cousin had ovarian cancer, and second maternal cousin had mouth cancer, and  maternal great aunts and uncles have had breast cancer as well.  menarche was at 39, menopause was age 16, Menopause -33 Parity -0   Work -patient is retired but does a lot of gardening. Patient is up-to-date with colonoscopy and bone density.   Diagnostic mammogram: [facility] 11/05/23  Dx ultrasound [facility] 11/05/23    Pathology core needle biopsy: 11/14/23 1. Breast, left, needle core biopsy, 11:00 3cmfn :      - INVASIVE DUCTAL CARCINOMA, SEE NOTE      -  FOCAL DUCTAL CARCINOMA IN SITU, NUCLEAR GRADE 2 OF 3      - TUBULE FORMATION: SCORE 3/3      - NUCLEAR PLEOMORPHISM: SCORE 2/3      - MITOTIC COUNT: SCORE 1/3      - TOTAL SCORE: 6/9      - OVERALL GRADE: II/III      - LYMPHOVASCULAR INVASION: NOT IDENTIFIED      - CANCER LENGTH: 3.5 MM IN GREATEST LINEAR DIMENSION ON HEAVILY FRAGMENTED      CORES.      - CALCIFICATIONS: NOT IDENTIFIED      - OTHER FINDINGS: N/A      NOTE:      DR. [facility] REVIEWED THE CASE AND CONCURS WITH THE INTERPRETATION.  A BREAST      PROGNOSTIC PROFILE (ER, PR, KI-67 AND HER2) IS PENDING AND WILL BE REPORTED IN      AN ADDENDUM.  [facility] WAS NOTIFIED ON 11/15/2023.  2. Breast, left, needle core biopsy, 11:00 1cmfn :      -  BENIGN BREAST TISSUE WITH FIBROCYSTIC CHANGE AND FOCAL USUAL DUCTAL      HYPERPLASIA.    Receptors: The tumor cells are NEGATIVE for Her2 (1+). Estrogen Receptor:   100%, POSITIVE, STRONG STAINING INTENSITY Progesterone Receptor:  100%, POSITIVE, STRONG STAINING INTENSITY Proliferation Marker Ki-67:      5%      Review of Systems: A complete review of systems was obtained from the patient.  I have reviewed this information and discussed as appropriate with the patient.  See HPI as well for other ROS. ROS -positive for sinus issues, back pain, and vision visual changes.  Patient can feel when she is having atrial fibrillation issues.       Medical History: Past Medical History      Past Medical History:   Diagnosis Date   Arrhythmia     Glaucoma (increased eye pressure)     Hyperlipidemia          Problem List     Patient Active Problem List  Diagnosis   Malignant neoplasm of upper-inner quadrant of left breast in female, estrogen receptor positive (CMS/HHS-HCC)   Paroxysmal atrial fibrillation (CMS/HHS-HCC)   On continuous oral anticoagulation   Aortic atherosclerosis ()   Bilateral degenerative progressive high myopia   Chronic follicular conjunctivitis of both eyes   Chronic maxillary sinusitis   Vitamin D  deficiency   Primary open angle glaucoma of right eye, severe stage   Osteoporosis   Bradycardia   Family history of cancer        Past Surgical History       Past Surgical History:  Procedure Laterality Date   KYPHOPLASTY N/A 04/29/2015   REPAIR PATELLAR TENDON Left 10/27/2020        Allergies       Allergies  Allergen Reactions   Alendronate  Other (See Comments)      GI upset, bone pain; tolerated Actonel   Brinzolamide-Brimonidine Itching, Rash and Other (See Comments)   Cortisone Other (See Comments) and Rash      Entire body hurt from a cortisone shot.   Timolol Other (See Comments) and Rash      Slows heart rate   Slowed her heart rate down   Slows heart rate    Slowed her heart rate down   Tramadol  Rash and Other (See Comments)        Medications Ordered Prior to Encounter        Current Outpatient Medications on File Prior to Visit  Medication Sig Dispense Refill   calcium  citrate-vitamin D3 (CITRACAL+D) 315 mg-5 mcg (200 unit) tablet Take 1 tablet by mouth at bedtime       denosumab  (PROLIA ) 60 mg/mL inj syringe Inject 60 mg subcutaneously every 6 (six) months Administer in upper arm, thigh, or abdomen       dorzolamide  (TRUSOPT ) 2 % ophthalmic solution Place 1 drop into both eyes 3 (three) times daily       ELIQUIS  5 mg tablet Take 5 mg by mouth 2 (two) times daily       latanoprost  (XALATAN ) 0.005 % ophthalmic solution Place 1 drop into  both eyes at bedtime       pilocarpine  (ISOPTO CARPINE ) 4 % ophthalmic solution Place 1 drop into both eyes 3 (three) times daily       rosuvastatin  (CRESTOR ) 20 MG tablet Take 20 mg by mouth as directed (TAKE 1 TABLET (20  MG TOTAL) BY MOUTH 2 DAYS PER WEEK)       dorzolamide  (TRUSOPT ) 2 % ophthalmic solution 1 drop 3 (three) times daily.       HYDROcodone-acetaminophen  (NORCO) 5-325 mg tablet Take 1 tablet by mouth every 6 (six) hours as needed for Pain. 15 tablet 0   latanoprost  (XALATAN ) 0.005 % ophthalmic solution 1 drop nightly.       olopatadine  (PATADAY ) 0.2 % ophthalmic solution Apply 1 drop to eye as directed        No current facility-administered medications on file prior to visit.        Family History       Family History  Problem Relation Age of Onset   High blood pressure (Hypertension) Mother     Breast cancer Mother          Tobacco Use History  Social History       Tobacco Use  Smoking Status Never  Smokeless Tobacco Not on file        Social History  Social History         Socioeconomic History   Marital status: Single  Tobacco Use   Smoking status: Never  Vaping Use   Vaping status: Never Used  Substance and Sexual Activity   Alcohol use: Yes      Alcohol/week: 0.0 standard drinks of alcohol   Drug use: Never    Social Drivers of Health        Housing Stability: Unknown (11/19/2023)    Housing Stability Vital Sign     Homeless in the Last Year: No        Objective:         Vitals:    11/21/23 0949  BP: 139/41  Pulse: 62  Resp: 18  Temp: 36.5 C (97.7 F)  Weight: 62.3 kg (137 lb 4.8 oz)  Height: 170.2 cm (5' 7)    Body mass index is 21.5 kg/m.       Gen:  No acute distress.  Well nourished and well groomed.   Neurological: Alert and oriented to person, place, and time. Coordination normal.  Head: Normocephalic and atraumatic.  Eyes: Conjunctivae are normal. Pupils are equal, round, and reactive to light. No scleral  icterus.  Neck: Normal range of motion. Neck supple. No tracheal deviation or thyromegaly present.  Cardiovascular: Normal rate, regular rhythm, normal heart sounds and intact distal pulses.  Exam reveals no gallop and no friction rub.  No murmur heard. Breast: significant bruising left breast.  Left breast sl smaller.  No palp masses either breast.  Mild ptosis. No LAD. No nipple retraction or nipple discharge.   Respiratory: Effort normal.  No respiratory distress. No chest wall tenderness. Breath sounds normal.  No wheezes, rales or rhonchi.  GI: Soft. Bowel sounds are normal. The abdomen is soft and nontender.  There is no rebound and no guarding.  Musculoskeletal: Normal range of motion. Extremities are nontender.  Lymphadenopathy: No cervical, preauricular, postauricular or axillary adenopathy is present Skin: Skin is warm and dry. No rash noted. No diaphoresis. No erythema. No pallor. No clubbing, cyanosis, or edema.   Psychiatric: Normal mood and affect. Behavior is normal. Judgment and thought content normal.      Labs Pending from today.    Assessment and Plan:        ICD-10-CM    1. Malignant neoplasm of upper-inner quadrant of left breast in female, estrogen receptor positive (CMS/HHS-HCC)  C50.212      Z17.0  2. Paroxysmal atrial fibrillation (CMS/HHS-HCC)  I48.0       3. On continuous oral anticoagulation  Z79.01       4. Family history of cancer  Z80.9           Patient has a new diagnosis of clinical T1c and 0 left breast cancer that is hormone positive.  Due to the favorable prognosis and age, we will plan to do a seed localized lumpectomy without sentinel lymph node biopsy.  She is being offered genetic testing.  Based on final pathology, we will determine whether she will undergo antihormonal treatment or radiation or both.  She certainly will be offered both desired.   We will set up surgery for around 2 to 2-1/2 weeks in order to allow the genetic testing to  come back.  In this way, we can alter the surgical plan if desired if a positive genetic result is obtained.   Will contact cardiology regarding restratification and holding Eliquis .   The surgical procedure was described to the patient.  I discussed the incision type and location and that we would need radiology involved pre op to place a seed 1-2 days pre op.      We discussed the risks bleeding, infection, damage to other structures, need for further procedures/surgeries.  We discussed the risk of seroma.  The patient was advised if the breast has cancer, we may need to go back to surgery for additional tissue to obtain negative margins or for a lymph node biopsy. The patient was advised that these are the most common complications, but that others can occur as well. I discussed the risk of alteration in breast contour or size.  I discussed risk of chronic pain.  There are rare instances of heart/lung issues post op as well as blood clots.      They were advised that there will be instructions regarding holding blood thinners before surgery.     The risks and benefits of the procedure were described to the patient and she wishes to proceed.

## 2023-12-12 ENCOUNTER — Other Ambulatory Visit: Payer: Self-pay

## 2023-12-12 ENCOUNTER — Ambulatory Visit (HOSPITAL_BASED_OUTPATIENT_CLINIC_OR_DEPARTMENT_OTHER): Admitting: Certified Registered"

## 2023-12-12 ENCOUNTER — Ambulatory Visit (HOSPITAL_BASED_OUTPATIENT_CLINIC_OR_DEPARTMENT_OTHER)
Admission: RE | Admit: 2023-12-12 | Discharge: 2023-12-12 | Disposition: A | Discharge status: Home / Self Care | Attending: General Surgery | Admitting: General Surgery

## 2023-12-12 ENCOUNTER — Encounter (HOSPITAL_BASED_OUTPATIENT_CLINIC_OR_DEPARTMENT_OTHER): Payer: Self-pay | Admitting: General Surgery

## 2023-12-12 ENCOUNTER — Encounter (HOSPITAL_BASED_OUTPATIENT_CLINIC_OR_DEPARTMENT_OTHER)
Admission: RE | Disposition: A | Discharge status: Home / Self Care | Payer: Self-pay | Source: Home / Self Care | Attending: General Surgery

## 2023-12-12 DIAGNOSIS — Z170 Estrogen receptor positive status [ER+]: Secondary | ICD-10-CM | POA: Insufficient documentation

## 2023-12-12 DIAGNOSIS — C50912 Malignant neoplasm of unspecified site of left female breast: Secondary | ICD-10-CM

## 2023-12-12 DIAGNOSIS — Z808 Family history of malignant neoplasm of other organs or systems: Secondary | ICD-10-CM | POA: Diagnosis not present

## 2023-12-12 DIAGNOSIS — Z800 Family history of malignant neoplasm of digestive organs: Secondary | ICD-10-CM | POA: Insufficient documentation

## 2023-12-12 DIAGNOSIS — I480 Paroxysmal atrial fibrillation: Secondary | ICD-10-CM | POA: Insufficient documentation

## 2023-12-12 DIAGNOSIS — Z01818 Encounter for other preprocedural examination: Secondary | ICD-10-CM

## 2023-12-12 DIAGNOSIS — Z803 Family history of malignant neoplasm of breast: Secondary | ICD-10-CM | POA: Insufficient documentation

## 2023-12-12 DIAGNOSIS — I4891 Unspecified atrial fibrillation: Secondary | ICD-10-CM

## 2023-12-12 DIAGNOSIS — Z1721 Progesterone receptor positive status: Secondary | ICD-10-CM | POA: Insufficient documentation

## 2023-12-12 DIAGNOSIS — C50212 Malignant neoplasm of upper-inner quadrant of left female breast: Secondary | ICD-10-CM | POA: Diagnosis not present

## 2023-12-12 DIAGNOSIS — Z8041 Family history of malignant neoplasm of ovary: Secondary | ICD-10-CM | POA: Insufficient documentation

## 2023-12-12 DIAGNOSIS — Z7901 Long term (current) use of anticoagulants: Secondary | ICD-10-CM | POA: Diagnosis not present

## 2023-12-12 DIAGNOSIS — Z1732 Human epidermal growth factor receptor 2 negative status: Secondary | ICD-10-CM | POA: Diagnosis not present

## 2023-12-12 DIAGNOSIS — N6012 Diffuse cystic mastopathy of left breast: Secondary | ICD-10-CM | POA: Diagnosis not present

## 2023-12-12 HISTORY — PX: BREAST LUMPECTOMY: SHX2

## 2023-12-12 HISTORY — DX: Cardiac arrhythmia, unspecified: I49.9

## 2023-12-12 SURGERY — LUMPECTOMY WITH MAGNETIC MARKER LOCALIZATION
Anesthesia: General | Site: Breast | Laterality: Left

## 2023-12-12 MED ORDER — FENTANYL CITRATE (PF) 100 MCG/2ML IJ SOLN
INTRAMUSCULAR | Status: DC | PRN
  Administered 2023-12-12 (×2): 25 ug via INTRAVENOUS
  Administered 2023-12-12: 50 ug via INTRAVENOUS

## 2023-12-12 MED ORDER — ATROPINE SULFATE 0.4 MG/ML IV SOLN
INTRAVENOUS | Status: AC
  Filled 2023-12-12: qty 1

## 2023-12-12 MED ORDER — LIDOCAINE HCL (CARDIAC) PF 100 MG/5ML IV SOSY
PREFILLED_SYRINGE | INTRAVENOUS | Status: DC | PRN
  Administered 2023-12-12: 20 mg via INTRAVENOUS

## 2023-12-12 MED ORDER — ONDANSETRON HCL 4 MG/2ML IJ SOLN
INTRAMUSCULAR | Status: AC
  Filled 2023-12-12: qty 2

## 2023-12-12 MED ORDER — 0.9 % SODIUM CHLORIDE (POUR BTL) OPTIME
TOPICAL | Status: DC | PRN
  Administered 2023-12-12: 1000 mL

## 2023-12-12 MED ORDER — EPHEDRINE 5 MG/ML INJ
INTRAVENOUS | Status: AC
Start: 2023-12-12 — End: 2023-12-12
  Filled 2023-12-12: qty 10

## 2023-12-12 MED ORDER — LACTATED RINGERS IV SOLN: INTRAVENOUS | Status: DC

## 2023-12-12 MED ORDER — CEFAZOLIN SODIUM-DEXTROSE 2-4 GM/100ML-% IV SOLN
INTRAVENOUS | Status: AC
  Filled 2023-12-12: qty 100

## 2023-12-12 MED ORDER — OXYCODONE HCL 5 MG/5ML PO SOLN: 5.0000 mg | Freq: Once | ORAL | Status: DC | PRN

## 2023-12-12 MED ORDER — ONDANSETRON HCL 4 MG/2ML IJ SOLN
INTRAMUSCULAR | Status: DC | PRN
Start: 2023-12-12 — End: 2023-12-12
  Administered 2023-12-12: 4 mg via INTRAVENOUS

## 2023-12-12 MED ORDER — HYDROMORPHONE HCL 1 MG/ML IJ SOLN
INTRAMUSCULAR | Status: AC
  Filled 2023-12-12: qty 0.5

## 2023-12-12 MED ORDER — PROPOFOL 10 MG/ML IV BOLUS
INTRAVENOUS | Status: AC
Start: 2023-12-12 — End: 2023-12-12
  Filled 2023-12-12: qty 20

## 2023-12-12 MED ORDER — SODIUM CHLORIDE 0.9 % IV SOLN: 12.5000 mg | INTRAVENOUS | Status: DC | PRN

## 2023-12-12 MED ORDER — ACETAMINOPHEN 500 MG PO TABS
1000.0000 mg | ORAL_TABLET | ORAL | Status: AC
  Administered 2023-12-12: 1000 mg via ORAL

## 2023-12-12 MED ORDER — CEFAZOLIN SODIUM-DEXTROSE 2-4 GM/100ML-% IV SOLN
2.0000 g | INTRAVENOUS | Status: AC
  Administered 2023-12-12: 2 g via INTRAVENOUS

## 2023-12-12 MED ORDER — AMISULPRIDE (ANTIEMETIC) 5 MG/2ML IV SOLN: 10.0000 mg | Freq: Once | INTRAVENOUS | Status: DC | PRN

## 2023-12-12 MED ORDER — BUPIVACAINE HCL 0.25 % IJ SOLN
INTRAMUSCULAR | Status: DC | PRN
  Administered 2023-12-12: 30 mL

## 2023-12-12 MED ORDER — DEXAMETHASONE SODIUM PHOSPHATE 10 MG/ML IJ SOLN
INTRAMUSCULAR | Status: AC
  Filled 2023-12-12: qty 1

## 2023-12-12 MED ORDER — HYDROMORPHONE HCL 1 MG/ML IJ SOLN
0.2500 mg | INTRAMUSCULAR | Status: DC | PRN
  Administered 2023-12-12: 0.25 mg via INTRAVENOUS

## 2023-12-12 MED ORDER — PROPOFOL 10 MG/ML IV BOLUS
INTRAVENOUS | Status: DC | PRN
  Administered 2023-12-12: 200 mg via INTRAVENOUS

## 2023-12-12 MED ORDER — OXYCODONE HCL 5 MG PO TABS: 5.0000 mg | ORAL_TABLET | Freq: Four times a day (QID) | ORAL | 0 refills | Status: AC | PRN

## 2023-12-12 MED ORDER — FENTANYL CITRATE (PF) 100 MCG/2ML IJ SOLN
INTRAMUSCULAR | Status: AC
  Filled 2023-12-12: qty 2

## 2023-12-12 MED ORDER — EPHEDRINE SULFATE (PRESSORS) 50 MG/ML IJ SOLN
INTRAMUSCULAR | Status: DC | PRN
  Administered 2023-12-12: 10 mg via INTRAVENOUS
  Administered 2023-12-12: 5 mg via INTRAVENOUS
  Administered 2023-12-12: 10 mg via INTRAVENOUS
  Administered 2023-12-12: 5 mg via INTRAVENOUS

## 2023-12-12 MED ORDER — LIDOCAINE 2% (20 MG/ML) 5 ML SYRINGE
INTRAMUSCULAR | Status: AC
  Filled 2023-12-12: qty 5

## 2023-12-12 MED ORDER — ACETAMINOPHEN 500 MG PO TABS
ORAL_TABLET | ORAL | Status: AC
Start: 2023-12-12 — End: 2023-12-12
  Filled 2023-12-12: qty 2

## 2023-12-12 MED ORDER — OXYCODONE HCL 5 MG PO TABS: 5.0000 mg | ORAL_TABLET | Freq: Once | ORAL | Status: DC | PRN

## 2023-12-12 MED ORDER — BUPIVACAINE HCL (PF) 0.25 % IJ SOLN
INTRAMUSCULAR | Status: AC
  Filled 2023-12-12: qty 30

## 2023-12-12 SURGICAL SUPPLY — 43 items
BINDER BREAST LRG (GAUZE/BANDAGES/DRESSINGS) IMPLANT
BINDER BREAST MEDIUM (GAUZE/BANDAGES/DRESSINGS) IMPLANT
BINDER BREAST XLRG (GAUZE/BANDAGES/DRESSINGS) IMPLANT
BINDER BREAST XXLRG (GAUZE/BANDAGES/DRESSINGS) IMPLANT
BLADE SURG 10 STRL SS (BLADE) ×1 IMPLANT
CANISTER SUC SOCK COL 7IN (MISCELLANEOUS) IMPLANT
CANISTER SUCT 1200ML W/VALVE (MISCELLANEOUS) IMPLANT
CHLORAPREP W/TINT 26 (MISCELLANEOUS) ×1 IMPLANT
CLIP TI LARGE 6 (CLIP) ×1 IMPLANT
CLIP TI MEDIUM 6 (CLIP) IMPLANT
COVER BACK TABLE 60X90IN (DRAPES) ×1 IMPLANT
COVER MAYO STAND STRL (DRAPES) ×1 IMPLANT
COVER PROBE CYLINDRICAL 5X96 (MISCELLANEOUS) ×1 IMPLANT
DERMABOND ADVANCED .7 DNX12 (GAUZE/BANDAGES/DRESSINGS) ×1 IMPLANT
DRAPE LAPAROSCOPIC ABDOMINAL (DRAPES) ×1 IMPLANT
DRAPE UTILITY XL STRL (DRAPES) ×1 IMPLANT
ELECT COATED BLADE 2.86 ST (ELECTRODE) ×1 IMPLANT
ELECTRODE REM PT RTRN 9FT ADLT (ELECTROSURGICAL) ×1 IMPLANT
GAUZE SPONGE 4X4 12PLY STRL LF (GAUZE/BANDAGES/DRESSINGS) ×1 IMPLANT
GLOVE BIO SURGEON STRL SZ 6 (GLOVE) ×1 IMPLANT
GLOVE BIOGEL PI IND STRL 6.5 (GLOVE) ×1 IMPLANT
GOWN STRL REUS W/ TWL LRG LVL3 (GOWN DISPOSABLE) ×1 IMPLANT
GOWN STRL REUS W/ TWL XL LVL3 (GOWN DISPOSABLE) ×1 IMPLANT
KIT MARKER MARGIN INK (KITS) ×1 IMPLANT
LIGHT WAVEGUIDE WIDE FLAT (MISCELLANEOUS) IMPLANT
NDL HYPO 25X1 1.5 SAFETY (NEEDLE) ×1 IMPLANT
NEEDLE HYPO 25X1 1.5 SAFETY (NEEDLE) ×1 IMPLANT
NS IRRIG 1000ML POUR BTL (IV SOLUTION) ×1 IMPLANT
PACK BASIN DAY SURGERY FS (CUSTOM PROCEDURE TRAY) ×1 IMPLANT
PENCIL SMOKE EVACUATOR (MISCELLANEOUS) ×1 IMPLANT
SLEEVE SCD COMPRESS KNEE MED (STOCKING) ×1 IMPLANT
SPIKE FLUID TRANSFER (MISCELLANEOUS) IMPLANT
SPONGE T-LAP 18X18 ~~LOC~~+RFID (SPONGE) ×1 IMPLANT
STRIP CLOSURE SKIN 1/2X4 (GAUZE/BANDAGES/DRESSINGS) ×1 IMPLANT
SUT MNCRL AB 4-0 PS2 18 (SUTURE) ×1 IMPLANT
SUT SILK 2 0 SH (SUTURE) IMPLANT
SUT VIC AB 2-0 SH 27XBRD (SUTURE) ×1 IMPLANT
SUT VICRYL 3-0 CR8 SH (SUTURE) ×1 IMPLANT
SYR CONTROL 10ML LL (SYRINGE) ×1 IMPLANT
TOWEL GREEN STERILE FF (TOWEL DISPOSABLE) ×1 IMPLANT
TRAY FAXITRON CT DISP (TRAY / TRAY PROCEDURE) ×1 IMPLANT
TUBE CONNECTING 20X1/4 (TUBING) IMPLANT
YANKAUER SUCT BULB TIP NO VENT (SUCTIONS) IMPLANT

## 2023-12-12 NOTE — Transfer of Care (Signed)
 Immediate Anesthesia Transfer of Care Note  Patient: Paige Klein  Procedure(s) Performed: LUMPECTOMY WITH MAGNETIC MARKER LOCALIZATION (Left: Breast)  Patient Location: PACU  Anesthesia Type:General  Level of Consciousness: awake, alert , and oriented  Airway & Oxygen Therapy: Patient Spontanous Breathing and Patient connected to face mask oxygen  Post-op Assessment: Report given to RN and Post -op Vital signs reviewed and stable  Post vital signs: Reviewed and stable  Last Vitals:  Vitals Value Taken Time  BP 168/79 12/12/23 13:45  Temp    Pulse 123 12/12/23 13:45  Resp 21 12/12/23 13:45  SpO2 100 % 12/12/23 13:45  Vitals shown include unfiled device data.  Last Pain:  Vitals:   12/12/23 1052  TempSrc: Temporal  PainSc: 0-No pain      Patients Stated Pain Goal: 4 (12/12/23 1052)  Complications: No notable events documented.

## 2023-12-12 NOTE — Anesthesia Postprocedure Evaluation (Signed)
 Anesthesia Post Note  Patient: Paige Klein  Procedure(s) Performed: LUMPECTOMY WITH MAGNETIC MARKER LOCALIZATION (Left: Breast)     Patient location during evaluation: PACU Anesthesia Type: General Level of consciousness: awake and alert Pain management: pain level controlled Vital Signs Assessment: post-procedure vital signs reviewed and stable Respiratory status: spontaneous breathing, nonlabored ventilation, respiratory function stable and patient connected to nasal cannula oxygen Cardiovascular status: blood pressure returned to baseline and stable Postop Assessment: no apparent nausea or vomiting Anesthetic complications: no   No notable events documented.  Last Vitals:  Vitals:   12/12/23 1412 12/12/23 1415  BP:  (!) 144/61  Pulse:  65  Resp:  11  Temp: (!) 36.1 C   SpO2:  100%    Last Pain:  Vitals:   12/12/23 1432  TempSrc:   PainSc: 3                  Evamarie Raetz P Toy Samarin

## 2023-12-12 NOTE — Anesthesia Preprocedure Evaluation (Addendum)
 Anesthesia Evaluation  Patient identified by MRN, date of birth, ID band Patient awake    Reviewed: Allergy & Precautions, NPO status , Patient's Chart, lab work & pertinent test results  Airway Mallampati: II  TM Distance: >3 FB Neck ROM: Full    Dental no notable dental hx.    Pulmonary neg pulmonary ROS   Pulmonary exam normal breath sounds clear to auscultation       Cardiovascular Normal cardiovascular exam+ dysrhythmias Atrial Fibrillation  Rhythm:Regular Rate:Normal     Neuro/Psych  Headaches  negative psych ROS   GI/Hepatic negative GI ROS, Neg liver ROS,,,  Endo/Other  negative endocrine ROS    Renal/GU negative Renal ROS  negative genitourinary   Musculoskeletal negative musculoskeletal ROS (+)    Abdominal   Peds negative pediatric ROS (+)  Hematology negative hematology ROS (+)   Anesthesia Other Findings Breast cancer  Reproductive/Obstetrics negative OB ROS                              Anesthesia Physical Anesthesia Plan  ASA: 3  Anesthesia Plan: General   Post-op Pain Management: Tylenol  PO (pre-op)*   Induction: Intravenous  PONV Risk Score and Plan: 3 and Ondansetron , Dexamethasone , Treatment may vary due to age or medical condition and Midazolam   Airway Management Planned: LMA  Additional Equipment:   Intra-op Plan:   Post-operative Plan: Extubation in OR  Informed Consent: I have reviewed the patients History and Physical, chart, labs and discussed the procedure including the risks, benefits and alternatives for the proposed anesthesia with the patient or authorized representative who has indicated his/her understanding and acceptance.     Dental advisory given  Plan Discussed with: CRNA and Surgeon  Anesthesia Plan Comments:          Anesthesia Quick Evaluation

## 2023-12-12 NOTE — Interval H&P Note (Signed)
 History and Physical Interval Note:  12/12/2023 11:51 AM  Paige Klein  has presented today for surgery, with the diagnosis of LEFT BREAST CANCER.  The various methods of treatment have been discussed with the patient and family. After consideration of risks, benefits and other options for treatment, the patient has consented to  Procedure(s) with comments: LUMPECTOMY WITH MAGNETIC MARKER LOCALIZATION (Left) - LEFT SEED TARGETED LUMPECTOMY as a surgical intervention.  The patient's history has been reviewed, patient examined, no change in status, stable for surgery.  I have reviewed the patient's chart and labs.  Questions were answered to the patient's satisfaction.     Jina Nephew

## 2023-12-12 NOTE — Discharge Instructions (Addendum)
 [facility] Office Phone Number [phone]  BREAST BIOPSY/ PARTIAL MASTECTOMY: POST OP INSTRUCTIONS  Always review your discharge instruction sheet given to you by the facility where your surgery was performed.  IF YOU HAVE DISABILITY OR FAMILY LEAVE FORMS, YOU MUST BRING THEM TO THE OFFICE FOR PROCESSING.  DO NOT GIVE THEM TO YOUR DOCTOR.  Take 2 tylenol  (acetominophen) three times a day for 3 days.  If you still have pain, add ibuprofen  with food in between if able to take this (if you have kidney issues or stomach issues, do not take ibuprofen ).  If both of those are not enough, add the narcotic pain pill.  If you find you are needing a lot of this overnight after surgery, call the next morning for a refill.    Prescriptions will not be filled after 5pm or on week-ends. Take your usually prescribed medications unless otherwise directed You should eat very light the first 24 hours after surgery, such as soup, crackers, pudding, etc.  Resume your normal diet the day after surgery. Most patients will experience some swelling and bruising in the breast.  Ice packs and a good support bra will help.  Swelling and bruising can take several days to resolve.  It is common to experience some constipation if taking pain medication after surgery.  Increasing fluid intake and taking a stool softener will usually help or prevent this problem from occurring.  A mild laxative (Milk of Magnesia or Miralax ) should be taken according to package directions if there are no bowel movements after 48 hours. Unless discharge instructions indicate otherwise, you may remove your bandages 48 hours after surgery, and you may shower at that time.  You may have steri-strips (small skin tapes) in place directly over the incision.  These strips should be left on the skin at least for for 7-10 days.    ACTIVITIES:  You may resume regular daily activities (gradually increasing) beginning the next day.  Wearing a  good support bra or sports bra (or the breast binder) minimizes pain and swelling.  You may have sexual intercourse when it is comfortable. No heavy lifting for 1-2 weeks (not over around 10 pounds).  You may drive when you no longer are taking prescription pain medication, you can comfortably wear a seatbelt, and you can safely maneuver your car and apply brakes. RETURN TO WORK:  __________3-14 days depending on job. _______________ Rosine should see your doctor in the office for a follow-up appointment approximately two weeks after your surgery.  Your doctor's nurse will typically make your follow-up appointment when she calls you with your pathology report.  Expect your pathology report 3-4 business days after your surgery.  You may call to check if you do not hear from us  after three days.   WHEN TO CALL YOUR DOCTOR: Fever over 101.0 Nausea and/or vomiting. Extreme swelling or bruising. Continued bleeding from incision. Increased pain, redness, or drainage from the incision.  The clinic staff is available to answer your questions during regular business hours.  Please don't hesitate to call and ask to speak to one of the nurses for clinical concerns.  If you have a medical emergency, go to the nearest emergency room or call 911.  A surgeon from [facility] is always on call at the hospital.  For further questions, please visit [email or web address]    Post Anesthesia Home Care Instructions  Activity: Get plenty of rest for the remainder of the day. A responsible individual must stay  with you for 24 hours following the procedure.  For the next 24 hours, DO NOT: -Drive a car -Advertising copywriter -Drink alcoholic beverages -Take any medication unless instructed by your physician -Make any legal decisions or sign important papers.  Meals: Start with liquid foods such as gelatin or soup. Progress to regular foods as tolerated. Avoid greasy, spicy, heavy foods. If nausea  and/or vomiting occur, drink only clear liquids until the nausea and/or vomiting subsides. Call your physician if vomiting continues.  Special Instructions/Symptoms: Your throat may feel dry or sore from the anesthesia or the breathing tube placed in your throat during surgery. If this causes discomfort, gargle with warm salt water. The discomfort should disappear within 24 hours.  If you had a scopolamine patch placed behind your ear for the management of post- operative nausea and/or vomiting:  1. The medication in the patch is effective for 72 hours, after which it should be removed.  Wrap patch in a tissue and discard in the trash. Wash hands thoroughly with soap and water. 2. You may remove the patch earlier than 72 hours if you experience unpleasant side effects which may include dry mouth, dizziness or visual disturbances. 3. Avoid touching the patch. Wash your hands with soap and water after contact with the patch.    Next dose of tylenol  will be at 5pm

## 2023-12-12 NOTE — Op Note (Signed)
 Left Breast Magseed localized lumpectomy  Indications: This patient presents with history of left breast cancer, upper inner quadrant, grade 2 invasive ductal carcinoma with DCIS,  receptors +/+/-, Ki 67 5%  Pre-operative Diagnosis: left breast cancer  Post-operative Diagnosis: Same  Surgeon: ARON SHOULDERS   Anesthesia: General endotracheal anesthesia  ASA Class: 3  Procedure Details  The patient was seen in the Holding Room. The risks, benefits, complications, treatment options, and expected outcomes were discussed with the patient. The possibilities of bleeding, infection, the need for additional procedures, failure to diagnose a condition, and creating a complication requiring other procedures or operations were discussed with the patient. The patient concurred with the proposed plan, giving informed consent.  The site of surgery properly noted/marked. The patient was taken to Operating Room # 8, identified, and the procedure verified as Left breast seed localized lumpectomy.  The left breast and chest were prepped and draped in standard fashion. A superomedial incision was made near the previously placed radioactive seed.  Dissection was carried down around the point of maximum signal intensity. The cautery was used to perform the dissection.   The specimen was inked with the margin marker paint kit.    Specimen radiography confirmed inclusion of the mammographic lesion, the clip, and the seed.  The background signal in the breast was zero.  Hemostasis was achieved with cautery.  The cavity was marked with clips on each border other than the anterior border.  Based on the 3D specimen mammogram, additional tissue was taken at the lateral and inferior borders.  The wound was irrigated.  The tissue was rearranged with the cautery to minimize the retroareolar defect.  This was pulled together with interrupted 3-0 vicryl.  The skin was then reapproximated with 3-0 vicryl interrupted deep dermal  sutures and 4-0 monocryl running subcuticular suture.      Sterile dressings were applied. At the end of the operation, all sponge, instrument, and needle counts were correct.   Findings: Seed, clip in specimen.  anterior margin is skin, posterior margin is pectoralis   Estimated Blood Loss:  min         Specimens: left breast tissue with seed, additional lateral margin, additional inferior margin         Complications:  None; patient tolerated the procedure well.         Disposition: PACU - hemodynamically stable.         Condition: stable

## 2023-12-12 NOTE — Anesthesia Procedure Notes (Signed)
 Procedure Name: LMA Insertion Date/Time: 12/12/2023 12:25 PM  Performed by: Debarah Chiquita LABOR, CRNAPre-anesthesia Checklist: Patient identified, Emergency Drugs available, Suction available and Patient being monitored Patient Re-evaluated:Patient Re-evaluated prior to induction Oxygen Delivery Method: Circle system utilized Preoxygenation: Pre-oxygenation with 100% oxygen Induction Type: IV induction Ventilation: Mask ventilation without difficulty LMA: LMA inserted LMA Size: 4.0 Number of attempts: 1 Airway Equipment and Method: Bite block Placement Confirmation: positive ETCO2 Tube secured with: Tape Dental Injury: Teeth and Oropharynx as per pre-operative assessment

## 2023-12-13 ENCOUNTER — Telehealth: Payer: Self-pay | Admitting: *Deleted

## 2023-12-13 ENCOUNTER — Other Ambulatory Visit: Payer: Self-pay | Admitting: Radiation Oncology

## 2023-12-13 ENCOUNTER — Inpatient Hospital Stay
Admission: RE | Admit: 2023-12-13 | Discharge: 2023-12-13 | Disposition: A | Discharge status: Home / Self Care | Payer: Self-pay | Source: Ambulatory Visit | Attending: Radiation Oncology | Admitting: Radiation Oncology

## 2023-12-13 DIAGNOSIS — C50212 Malignant neoplasm of upper-inner quadrant of left female breast: Secondary | ICD-10-CM

## 2023-12-13 NOTE — Telephone Encounter (Signed)
 Patient called and left message with navigator asking questions about her path (surgery was yesterday). Navigator left return message with information regarding when she should be hearing back results. Asked her to call back if any other needs.

## 2023-12-14 ENCOUNTER — Telehealth: Payer: Self-pay | Admitting: *Deleted

## 2023-12-14 NOTE — Telephone Encounter (Signed)
 Returned call to patient as she did not get voice mail left yesterday. She is aware of when she should hear about her pathology results. Patient stated she is doing well post op and has navigators number is has any other questions.

## 2023-12-17 ENCOUNTER — Ambulatory Visit: Payer: Self-pay | Admitting: General Surgery

## 2023-12-17 LAB — SURGICAL PATHOLOGY

## 2023-12-18 ENCOUNTER — Encounter: Payer: Self-pay | Admitting: *Deleted

## 2023-12-26 ENCOUNTER — Encounter: Payer: Self-pay | Admitting: Cardiology

## 2024-01-01 NOTE — Progress Notes (Signed)
 Radiation Oncology         [phone] ________________________________  Name: Paige Klein        MRN: [id number]  Date of Service: 01/09/2024 DOB: [birth year]  RR:Xwjee, [name], MD  Loretha Ash, MD     REFERRING PHYSICIAN: Loretha Ash, MD   DIAGNOSIS: The encounter diagnosis was Malignant neoplasm of upper-inner quadrant of left breast in female, estrogen receptor positive (HCC).   HISTORY OF PRESENT ILLNESS: Paige Klein is a 73 y.o. female originally seen in the multidisciplinary breast clinic for a new diagnosis of left breast cancer. The patient was noted to have a screening left breast asymmetry that persisted with diagnostic mammogram on 11/05/23. By ultrasound that day, she has a mass in the 11:00 position measuring 1.3 cm in greatest dimension and her axilla was negative for adenopathy. 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 An additional biopsy at 11:00 1cmfn was benign with fibrocystic and usual ductal hyperplasia. Her cancer was ER/PR positive, HER2 negative with a Ki 67 of 5%.   Since her last visit, genetic testing showed no clinically significant variants. She had lumpectomy on 12/12/23 that showed grade 2 invasive ductal carcinoma measuring 1.3 cm with associated intermediate grade DCIS. She had negative margins for invasive disease, the closest being 1.5 Klein from the superior margin.  Her DCIS margins were negative the closest also being 1.5 Klein from the superior margin.  Additional lateral and inferior excision was consistent with benign breast tissue.  She is seen to discuss adjuvant radiotherapy.      PREVIOUS RADIATION THERAPY: No   PAST MEDICAL HISTORY:  Past Medical History:  Diagnosis Date   A-fib (HCC) 05/22/2017   Abnormal Pap smear of cervix 1999   ASGUS x 1   Allergic rhinitis, cause unspecified    mold; and vasomotor rhinitis   Bradycardia 02/25/2014   Cataract    COVID-19 03/2020    Dysrhythmia    Family history of breast cancer    Family history of ovarian cancer    Family history of pancreatic cancer    Fibrocystic breast changes    and also breast calcifications (08/2008)   Glaucoma    Glaucoma    followed by [facility] Dr. Geneva   Glaucoma suspect of both eyes    high-normal intraocular pressures (Dr. Robinson)   H/O bone density study 09/07/2009   08/16/07, 08/08/05; osteoporosis   H/O mammogram 11/23/2010   normal   Hypercholesteremia    borderline per NMR lipoprofile 2008, 2007   Internal hemorrhoids    Kidney stone 1986   right   Migraine headache    resolved with menopause (menstrual migraines)   MVP (mitral valve prolapse)    per echo   Osteoporosis    Osteoporosis 02/22/2012   Pap smear for cervical cancer screening 04/14/2009   negative for malignancy, +atrophic changes   Postmenopausal    on compounded cream daily   Unspecified vitamin D  deficiency 2009   Vitamin D  deficiency 02/22/2012       PAST SURGICAL HISTORY: Past Surgical History:  Procedure Laterality Date   BREAST CYST ASPIRATION     many (Dr. Debby)   BREAST FIBROADENOMA SURGERY  11/18/2001   Dr.Gooden   BREAST LUMPECTOMY Left 12/12/2023   cardiolite (stress test)  01/19/2004   Dr. Harl   cataract surgery  03/20/2008   bilateral   CERVICAL POLYPECTOMY  03/20/2001   treated with cryosurgery   COLONOSCOPY  08/02/11; 05/2000   Dr. Kristie - rectal polyps 2013   DILATION AND CURETTAGE OF UTERUS  05/18/2009   uterine polyp, bleeding   endocervical cyst  12/96, 6/97   Dr Darina   EYE SURGERY     KYPHOPLASTY N/A 04/29/2015   Procedure: KYPHOPLASTY;  Surgeon: Oneil Priestly, MD;  Location: [facility] OR;  Service: Orthopedics;  Laterality: N/A;  Lumbar 1 kyphoplasty   PATELLAR TENDON REPAIR Left 10/27/2020   Procedure: PATELLA TENDON REPAIR;  Surgeon: Cristy Bonner DASEN, MD;  Location: [facility] OR;  Service: Orthopedics;  Laterality: Left;   PATELLECTOMY Left 10/27/2020   Procedure: PATELLECTOMY  PARTIAL;  Surgeon: Cristy Bonner DASEN, MD;  Location: [facility] OR;  Service: Orthopedics;  Laterality: Left;     FAMILY HISTORY:  Family History  Problem Relation Age of Onset   Hypertension Mother    Arthritis Mother    Breast cancer Mother        breast cancer in her 71's and 50's (bilateral)   Hypertension Father    Other Father        died of pancreatitis   Macular degeneration Sister    Muscular dystrophy Maternal Aunt        adult onset   Diabetes Maternal Aunt    Polymyalgia rheumatica Maternal Aunt    Celiac disease Maternal Uncle    Ovarian cancer Maternal Grandmother    Cervical cancer Maternal Grandmother    Pancreatic cancer Maternal Grandfather    Cancer Cousin        mouth (nonsmoker)   Ovarian cancer Cousin 48   Arthritis Cousin    Leukemia Nephew 30 - 40       CML     SOCIAL HISTORY:  reports that she has never smoked. She has never used smokeless tobacco. She reports current alcohol use. She reports that she does not use drugs. The patient is divorced and lives in [place]. She is a retired Systems analyst. She has a sister in [place]. She does not have any children. She's active in her church and enjoys being active physcially.    ALLERGIES: Brinzolamide-brimonidine, Cortisone, Fosamax  [alendronate  sodium], Timolol, and Tramadol    MEDICATIONS:  Current Outpatient Medications  Medication Sig Dispense Refill   apixaban  (ELIQUIS ) 5 MG TABS tablet TAKE 1 TABLET BY MOUTH TWICE A DAY 180 tablet 1   calcium  citrate-vitamin D  (CITRACAL+D) 315-200 MG-UNIT tablet Take 1 tablet by mouth at bedtime.     co-enzyme Q-10 30 MG capsule Take 30 mg by mouth 3 (three) times daily.     denosumab  (PROLIA ) 60 MG/ML SOLN injection Inject 60 mg into the skin every 6 (six) months. Administer in upper arm, thigh, or abdomen     diltiazem  (CARDIZEM ) 30 MG tablet Take 30 mg by mouth as needed (a-fib).     dorzolamide  (TRUSOPT ) 2 % ophthalmic solution Place 1 drop into both eyes 3  (three) times daily.  3   latanoprost  (XALATAN ) 0.005 % ophthalmic solution Place 1 drop into both eyes at bedtime.     Loratadine 10 MG CAPS Take by mouth.     LUTEIN PO Take 1 capsule by mouth at bedtime.     Multiple Vitamins-Minerals (CENTRUM SILVER PO) Take 1 tablet by mouth daily.     Olopatadine  HCl (PATADAY  OP) Apply 1 drop to eye as directed.     oxyCODONE  (OXY IR/ROXICODONE ) 5 MG immediate release tablet Take 1 tablet (5 mg total) by mouth every 6 (six) hours as needed for severe pain (  pain score 7-10). 5 tablet 0   pilocarpine  (PILOCAR) 4 % ophthalmic solution Place 1 drop into both eyes 3 (three) times daily.     rosuvastatin  (CRESTOR ) 20 MG tablet TAKE 1 TABLET (20 MG TOTAL) BY MOUTH 2 DAYS PER WEEK 25 tablet 2   No current facility-administered medications for this encounter.     REVIEW OF SYSTEMS: On review of systems, the patient reports that she is doing well regarding her healing. She feels fullness in the site of surgery but no changes have been noted in volume since surgery. She reports her eyesight is stable, and she prefers to schedule treatment early to mid day to avoid driving in the evening given her glacoma. She is also working through problems with a dental implant and saw ENT today and cultures from the area are being evaluated. No other complaints are verbalized.      PHYSICAL EXAM:  Wt Readings from Last 3 Encounters:  01/09/24 140 lb 6.4 oz (63.7 kg)  12/12/23 136 lb 7.4 oz (61.9 kg)  11/21/23 137 lb 4.8 oz (62.3 kg)   Temp Readings from Last 3 Encounters:  01/09/24 (!) 97.5 F (36.4 C)  12/12/23 (!) 97.1 F (36.2 C) (Temporal)  11/21/23 97.7 F (36.5 C) (Temporal)   BP Readings from Last 3 Encounters:  01/09/24 (!) 138/58  12/12/23 (!) 165/67  11/21/23 (!) 139/41   Pulse Readings from Last 3 Encounters:  01/09/24 (!) 55  12/12/23 61  11/21/23 62    In general this is a well appearing caucasian female in no acute distress. She's alert and  oriented x4 and appropriate throughout the examination. Cardiopulmonary assessment is negative for acute distress and she exhibits normal effort. The left breast reveals a well healed surgical incision site with mild bruising along the upper medial aspect of the incision. No erythema, separation, or drainage is noted. Induration is noted with light palpation but no fluctuence is noted. No other complaints are verbalized.     ECOG = 1  0 - Asymptomatic (Fully active, able to carry on all predisease activities without restriction)  1 - Symptomatic but completely ambulatory (Restricted in physically strenuous activity but ambulatory and able to carry out work of a light or sedentary nature. For example, light housework, office work)  2 - Symptomatic, <50% in bed during the day (Ambulatory and capable of all self care but unable to carry out any work activities. Up and about more than 50% of waking hours)  3 - Symptomatic, >50% in bed, but not bedbound (Capable of only limited self-care, confined to bed or chair 50% or more of waking hours)  4 - Bedbound (Completely disabled. Cannot carry on any self-care. Totally confined to bed or chair)  5 - Death   Paige Klein, Creech RH, Tormey DC, et al. [phone]). Toxicity and response criteria of the [facility]. Am. DOROTHA Bridges. Oncol. 5 (6): 649-55    LABORATORY DATA:  Lab Results  Component Value Date   WBC 5.5 11/21/2023   HGB 12.6 11/21/2023   HCT 38.9 11/21/2023   MCV 94.2 11/21/2023   PLT 275 11/21/2023   Lab Results  Component Value Date   NA 140 11/21/2023   K 4.3 11/21/2023   CL 108 11/21/2023   CO2 27 11/21/2023   Lab Results  Component Value Date   ALT 17 11/21/2023   AST 21 11/21/2023   ALKPHOS 45 11/21/2023   BILITOT 0.4 11/21/2023      RADIOGRAPHY:  No results found.     IMPRESSION/PLAN: 1. Stage IA, pT1c, cN0M0, grade 2, ER/PR positive invasive ductal carcinoma with associated intermediate grade DCIS  of the left breast. Dr. Dewey has reviewed her final pathology findings and today we reviewed the nature of early stage left breast disease. She has done well since surgery and Dr. Loretha did not recommend systemic chemotherapy. We reviewed the rationale for external beam radiation to the left breast to reduce risks of local breast cancer recurrence while we discussed previously that she might be able to forgo radiotherapy, her margins for DCIS are 1.5 Klein, and invasive as well. While not positive, DCIS is considered close. She would be eligible for  4 weeks of radiotherapy or possibly ultrahypofractionated course once weekly for 5 treatments. We discussed these differences, and at the conclusion of our visit, the patient is motivated to proceed with 4 weeks of hypofractionated therapy. Written consent is obtained and placed in the chart, a copy was provided to the patient. She will simulate today. 2.  Glaucoma. The patient has difficulty with driving, and I've asked our staff to be mindful of scheduling her treatment time so she can safely come for therapy.   In a visit lasting 45 minutes, greater than 50% of the time was spent face to face reviewing her case, as well as in preparation of, discussing     Donald KYM Husband, [facility]    **Disclaimer: This note was dictated with voice recognition software. Similar sounding words can inadvertently be transcribed and this note may contain transcription errors which may not have been corrected upon publication of note.**

## 2024-01-04 NOTE — Progress Notes (Signed)
 Location of Breast Cancer: Left breast  Histology per Pathology Report: ***  Receptor Status: ER(pos), PR (pos), Her2-neu (neg), Ki-(5%)  Did patient present with symptoms (if so, please note symptoms) or was this found on screening mammography?: was found on screening mammogram from 11/05/23  Past/Anticipated interventions by surgeon, if any:  She had lumpectomy on 12/12/23 that showed grade 2 invasive ductal carcinoma measuring 1.3 cm with associated intermediate grade DCIS. She had negative margins for invasive disease, the closest being 1.5 mm from the superior margin.  Her DCIS margins were negative the closest also being 1.5 mm from the superior margin.  Additional lateral and inferior excision was consistent with benign breast tissue.  Past/Anticipated interventions by medical oncology, if any:   Lymphedema issues, if any:  {:18581} {t:21944}   Pain issues, if any:  {:18581} {PAIN DESCRIPTION:21022940}  Skin issues if any   SAFETY ISSUES: Prior radiation? no Pacemaker/ICD? {:18581} Possible current pregnancy? No, postmenopausal Is the patient on methotrexate? no  Current Complaints / other details:  ***    Dyke JULIANNA Frost, LPN 89/82/7974,5:77 PM

## 2024-01-09 ENCOUNTER — Ambulatory Visit
Admission: RE | Admit: 2024-01-09 | Discharge: 2024-01-09 | Disposition: A | Source: Ambulatory Visit | Attending: Radiation Oncology | Admitting: Radiation Oncology

## 2024-01-09 ENCOUNTER — Ambulatory Visit
Admission: RE | Admit: 2024-01-09 | Discharge: 2024-01-09 | Disposition: A | Discharge status: Home / Self Care | Source: Ambulatory Visit | Attending: Radiation Oncology | Admitting: Radiation Oncology

## 2024-01-09 ENCOUNTER — Encounter: Payer: Self-pay | Admitting: Radiation Oncology

## 2024-01-09 VITALS — BP 138/58 | HR 55 | Temp 97.5°F | Resp 18 | Ht 67.0 in | Wt 140.4 lb

## 2024-01-09 DIAGNOSIS — E7800 Pure hypercholesterolemia, unspecified: Secondary | ICD-10-CM | POA: Diagnosis not present

## 2024-01-09 DIAGNOSIS — Z8616 Personal history of COVID-19: Secondary | ICD-10-CM | POA: Insufficient documentation

## 2024-01-09 DIAGNOSIS — I341 Nonrheumatic mitral (valve) prolapse: Secondary | ICD-10-CM | POA: Diagnosis not present

## 2024-01-09 DIAGNOSIS — Z7901 Long term (current) use of anticoagulants: Secondary | ICD-10-CM | POA: Insufficient documentation

## 2024-01-09 DIAGNOSIS — J320 Chronic maxillary sinusitis: Secondary | ICD-10-CM | POA: Diagnosis not present

## 2024-01-09 DIAGNOSIS — Z87442 Personal history of urinary calculi: Secondary | ICD-10-CM | POA: Diagnosis not present

## 2024-01-09 DIAGNOSIS — Z800 Family history of malignant neoplasm of digestive organs: Secondary | ICD-10-CM | POA: Insufficient documentation

## 2024-01-09 DIAGNOSIS — J309 Allergic rhinitis, unspecified: Secondary | ICD-10-CM | POA: Diagnosis not present

## 2024-01-09 DIAGNOSIS — Z510 Encounter for antineoplastic radiation therapy: Secondary | ICD-10-CM | POA: Diagnosis not present

## 2024-01-09 DIAGNOSIS — Z79899 Other long term (current) drug therapy: Secondary | ICD-10-CM | POA: Diagnosis not present

## 2024-01-09 DIAGNOSIS — R001 Bradycardia, unspecified: Secondary | ICD-10-CM | POA: Insufficient documentation

## 2024-01-09 DIAGNOSIS — Z8049 Family history of malignant neoplasm of other genital organs: Secondary | ICD-10-CM | POA: Diagnosis not present

## 2024-01-09 DIAGNOSIS — C50212 Malignant neoplasm of upper-inner quadrant of left female breast: Secondary | ICD-10-CM | POA: Insufficient documentation

## 2024-01-09 DIAGNOSIS — Z170 Estrogen receptor positive status [ER+]: Secondary | ICD-10-CM | POA: Diagnosis not present

## 2024-01-09 DIAGNOSIS — I4891 Unspecified atrial fibrillation: Secondary | ICD-10-CM | POA: Diagnosis not present

## 2024-01-09 DIAGNOSIS — Z8041 Family history of malignant neoplasm of ovary: Secondary | ICD-10-CM | POA: Diagnosis not present

## 2024-01-09 DIAGNOSIS — M810 Age-related osteoporosis without current pathological fracture: Secondary | ICD-10-CM | POA: Insufficient documentation

## 2024-01-09 DIAGNOSIS — R519 Headache, unspecified: Secondary | ICD-10-CM | POA: Diagnosis not present

## 2024-01-09 DIAGNOSIS — Z803 Family history of malignant neoplasm of breast: Secondary | ICD-10-CM | POA: Diagnosis not present

## 2024-01-09 DIAGNOSIS — H409 Unspecified glaucoma: Secondary | ICD-10-CM | POA: Diagnosis not present

## 2024-01-10 ENCOUNTER — Ambulatory Visit: Admitting: Radiation Oncology

## 2024-01-16 ENCOUNTER — Encounter: Payer: Self-pay | Admitting: *Deleted

## 2024-01-16 DIAGNOSIS — C50212 Malignant neoplasm of upper-inner quadrant of left female breast: Secondary | ICD-10-CM

## 2024-01-17 ENCOUNTER — Telehealth: Payer: Self-pay | Admitting: Hematology and Oncology

## 2024-01-17 NOTE — Telephone Encounter (Signed)
 left vm for pt about scheduled post xrt appt date and time. Encoraged to call back if need to reschedule

## 2024-01-22 ENCOUNTER — Ambulatory Visit
Admission: RE | Admit: 2024-01-22 | Discharge: 2024-01-22 | Disposition: A | Discharge status: Home / Self Care | Source: Ambulatory Visit | Attending: Radiation Oncology | Admitting: Radiation Oncology

## 2024-01-22 DIAGNOSIS — Z510 Encounter for antineoplastic radiation therapy: Secondary | ICD-10-CM | POA: Diagnosis not present

## 2024-01-22 DIAGNOSIS — C50212 Malignant neoplasm of upper-inner quadrant of left female breast: Secondary | ICD-10-CM | POA: Diagnosis not present

## 2024-01-22 DIAGNOSIS — Z170 Estrogen receptor positive status [ER+]: Secondary | ICD-10-CM | POA: Diagnosis not present

## 2024-01-23 ENCOUNTER — Other Ambulatory Visit: Payer: Self-pay

## 2024-01-23 DIAGNOSIS — Z170 Estrogen receptor positive status [ER+]: Secondary | ICD-10-CM | POA: Diagnosis not present

## 2024-01-23 DIAGNOSIS — C50212 Malignant neoplasm of upper-inner quadrant of left female breast: Secondary | ICD-10-CM | POA: Diagnosis not present

## 2024-01-23 DIAGNOSIS — Z510 Encounter for antineoplastic radiation therapy: Secondary | ICD-10-CM | POA: Diagnosis not present

## 2024-01-23 LAB — RAD ONC ARIA SESSION SUMMARY
Course Elapsed Days: 0
Plan Fractions Treated to Date: 1
Plan Prescribed Dose Per Fraction: 2.66 Gy
Plan Total Fractions Prescribed: 16
Plan Total Prescribed Dose: 42.56 Gy
Reference Point Dosage Given to Date: 2.66 Gy
Reference Point Session Dosage Given: 2.66 Gy
Session Number: 1

## 2024-01-24 ENCOUNTER — Other Ambulatory Visit: Payer: Self-pay

## 2024-01-24 ENCOUNTER — Ambulatory Visit
Admission: RE | Admit: 2024-01-24 | Discharge: 2024-01-24 | Disposition: A | Discharge status: Home / Self Care | Source: Ambulatory Visit | Attending: Radiation Oncology | Admitting: Radiation Oncology

## 2024-01-24 DIAGNOSIS — Z510 Encounter for antineoplastic radiation therapy: Secondary | ICD-10-CM | POA: Diagnosis not present

## 2024-01-24 DIAGNOSIS — C50212 Malignant neoplasm of upper-inner quadrant of left female breast: Secondary | ICD-10-CM | POA: Diagnosis not present

## 2024-01-24 DIAGNOSIS — Z170 Estrogen receptor positive status [ER+]: Secondary | ICD-10-CM | POA: Diagnosis not present

## 2024-01-24 LAB — RAD ONC ARIA SESSION SUMMARY
Course Elapsed Days: 1
Plan Fractions Treated to Date: 2
Plan Prescribed Dose Per Fraction: 2.66 Gy
Plan Total Fractions Prescribed: 16
Plan Total Prescribed Dose: 42.56 Gy
Reference Point Dosage Given to Date: 5.32 Gy
Reference Point Session Dosage Given: 2.66 Gy
Session Number: 2

## 2024-01-25 ENCOUNTER — Ambulatory Visit
Admission: RE | Admit: 2024-01-25 | Discharge: 2024-01-25 | Disposition: A | Discharge status: Home / Self Care | Source: Ambulatory Visit | Attending: Radiation Oncology | Admitting: Radiation Oncology

## 2024-01-25 ENCOUNTER — Other Ambulatory Visit: Payer: Self-pay

## 2024-01-25 DIAGNOSIS — C50212 Malignant neoplasm of upper-inner quadrant of left female breast: Secondary | ICD-10-CM

## 2024-01-25 DIAGNOSIS — Z170 Estrogen receptor positive status [ER+]: Secondary | ICD-10-CM | POA: Diagnosis not present

## 2024-01-25 DIAGNOSIS — Z510 Encounter for antineoplastic radiation therapy: Secondary | ICD-10-CM | POA: Diagnosis not present

## 2024-01-25 LAB — RAD ONC ARIA SESSION SUMMARY
Course Elapsed Days: 2
Plan Fractions Treated to Date: 3
Plan Prescribed Dose Per Fraction: 2.66 Gy
Plan Total Fractions Prescribed: 16
Plan Total Prescribed Dose: 42.56 Gy
Reference Point Dosage Given to Date: 7.98 Gy
Reference Point Session Dosage Given: 2.66 Gy
Session Number: 3

## 2024-01-25 MED ORDER — ALRA NON-METALLIC DEODORANT (RAD-ONC)
1.0000 | Freq: Once | TOPICAL | Status: AC
  Administered 2024-01-25: 1 via TOPICAL

## 2024-01-25 MED ORDER — RADIAPLEXRX EX GEL: Freq: Once | CUTANEOUS | Status: AC

## 2024-01-28 ENCOUNTER — Ambulatory Visit
Admission: RE | Admit: 2024-01-28 | Discharge: 2024-01-28 | Disposition: A | Discharge status: Home / Self Care | Source: Ambulatory Visit | Attending: Radiation Oncology | Admitting: Radiation Oncology

## 2024-01-28 ENCOUNTER — Other Ambulatory Visit: Payer: Self-pay

## 2024-01-28 DIAGNOSIS — Z510 Encounter for antineoplastic radiation therapy: Secondary | ICD-10-CM | POA: Diagnosis not present

## 2024-01-28 DIAGNOSIS — Z170 Estrogen receptor positive status [ER+]: Secondary | ICD-10-CM | POA: Diagnosis not present

## 2024-01-28 DIAGNOSIS — C50212 Malignant neoplasm of upper-inner quadrant of left female breast: Secondary | ICD-10-CM | POA: Diagnosis not present

## 2024-01-28 LAB — RAD ONC ARIA SESSION SUMMARY
Course Elapsed Days: 5
Plan Fractions Treated to Date: 4
Plan Prescribed Dose Per Fraction: 2.66 Gy
Plan Total Fractions Prescribed: 16
Plan Total Prescribed Dose: 42.56 Gy
Reference Point Dosage Given to Date: 10.64 Gy
Reference Point Session Dosage Given: 2.66 Gy
Session Number: 4

## 2024-01-29 ENCOUNTER — Other Ambulatory Visit: Payer: Self-pay

## 2024-01-29 ENCOUNTER — Ambulatory Visit
Admission: RE | Admit: 2024-01-29 | Discharge: 2024-01-29 | Disposition: A | Discharge status: Home / Self Care | Source: Ambulatory Visit | Attending: Radiation Oncology | Admitting: Radiation Oncology

## 2024-01-29 DIAGNOSIS — Z510 Encounter for antineoplastic radiation therapy: Secondary | ICD-10-CM | POA: Diagnosis not present

## 2024-01-29 LAB — RAD ONC ARIA SESSION SUMMARY
Course Elapsed Days: 6
Plan Fractions Treated to Date: 5
Plan Prescribed Dose Per Fraction: 2.66 Gy
Plan Total Fractions Prescribed: 16
Plan Total Prescribed Dose: 42.56 Gy
Reference Point Dosage Given to Date: 13.3 Gy
Reference Point Session Dosage Given: 2.66 Gy
Session Number: 5

## 2024-01-30 ENCOUNTER — Ambulatory Visit
Admission: RE | Admit: 2024-01-30 | Discharge: 2024-01-30 | Disposition: A | Discharge status: Home / Self Care | Source: Ambulatory Visit | Attending: Radiation Oncology | Admitting: Radiation Oncology

## 2024-01-30 ENCOUNTER — Other Ambulatory Visit: Payer: Self-pay

## 2024-01-30 DIAGNOSIS — Z510 Encounter for antineoplastic radiation therapy: Secondary | ICD-10-CM | POA: Diagnosis not present

## 2024-01-30 LAB — RAD ONC ARIA SESSION SUMMARY
Course Elapsed Days: 7
Plan Fractions Treated to Date: 6
Plan Prescribed Dose Per Fraction: 2.66 Gy
Plan Total Fractions Prescribed: 16
Plan Total Prescribed Dose: 42.56 Gy
Reference Point Dosage Given to Date: 15.96 Gy
Reference Point Session Dosage Given: 2.66 Gy
Session Number: 6

## 2024-01-31 ENCOUNTER — Ambulatory Visit
Admission: RE | Admit: 2024-01-31 | Discharge: 2024-01-31 | Disposition: A | Source: Ambulatory Visit | Attending: Radiation Oncology

## 2024-01-31 ENCOUNTER — Other Ambulatory Visit: Payer: Self-pay

## 2024-01-31 DIAGNOSIS — Z170 Estrogen receptor positive status [ER+]: Secondary | ICD-10-CM | POA: Diagnosis not present

## 2024-01-31 DIAGNOSIS — Z510 Encounter for antineoplastic radiation therapy: Secondary | ICD-10-CM | POA: Diagnosis not present

## 2024-01-31 DIAGNOSIS — C50212 Malignant neoplasm of upper-inner quadrant of left female breast: Secondary | ICD-10-CM | POA: Diagnosis not present

## 2024-01-31 LAB — RAD ONC ARIA SESSION SUMMARY
Course Elapsed Days: 8
Plan Fractions Treated to Date: 7
Plan Prescribed Dose Per Fraction: 2.66 Gy
Plan Total Fractions Prescribed: 16
Plan Total Prescribed Dose: 42.56 Gy
Reference Point Dosage Given to Date: 18.62 Gy
Reference Point Session Dosage Given: 2.66 Gy
Session Number: 7

## 2024-02-01 ENCOUNTER — Other Ambulatory Visit: Payer: Self-pay

## 2024-02-01 ENCOUNTER — Ambulatory Visit
Admission: RE | Admit: 2024-02-01 | Discharge: 2024-02-01 | Disposition: A | Discharge status: Home / Self Care | Source: Ambulatory Visit | Attending: Radiation Oncology | Admitting: Radiation Oncology

## 2024-02-01 DIAGNOSIS — C50212 Malignant neoplasm of upper-inner quadrant of left female breast: Secondary | ICD-10-CM | POA: Diagnosis not present

## 2024-02-01 DIAGNOSIS — Z510 Encounter for antineoplastic radiation therapy: Secondary | ICD-10-CM | POA: Diagnosis not present

## 2024-02-01 LAB — RAD ONC ARIA SESSION SUMMARY
Course Elapsed Days: 9
Plan Fractions Treated to Date: 8
Plan Prescribed Dose Per Fraction: 2.66 Gy
Plan Total Fractions Prescribed: 16
Plan Total Prescribed Dose: 42.56 Gy
Reference Point Dosage Given to Date: 21.28 Gy
Reference Point Session Dosage Given: 2.66 Gy
Session Number: 8

## 2024-02-04 ENCOUNTER — Other Ambulatory Visit: Payer: Self-pay

## 2024-02-04 ENCOUNTER — Ambulatory Visit
Admission: RE | Admit: 2024-02-04 | Discharge: 2024-02-04 | Disposition: A | Source: Ambulatory Visit | Attending: Radiation Oncology

## 2024-02-04 DIAGNOSIS — Z510 Encounter for antineoplastic radiation therapy: Secondary | ICD-10-CM | POA: Diagnosis not present

## 2024-02-04 LAB — RAD ONC ARIA SESSION SUMMARY
Course Elapsed Days: 12
Plan Fractions Treated to Date: 9
Plan Prescribed Dose Per Fraction: 2.66 Gy
Plan Total Fractions Prescribed: 16
Plan Total Prescribed Dose: 42.56 Gy
Reference Point Dosage Given to Date: 23.94 Gy
Reference Point Session Dosage Given: 2.66 Gy
Session Number: 9

## 2024-02-05 ENCOUNTER — Ambulatory Visit
Admission: RE | Admit: 2024-02-05 | Discharge: 2024-02-05 | Disposition: A | Source: Ambulatory Visit | Attending: Radiation Oncology

## 2024-02-05 ENCOUNTER — Other Ambulatory Visit: Payer: Self-pay

## 2024-02-05 DIAGNOSIS — J320 Chronic maxillary sinusitis: Secondary | ICD-10-CM | POA: Diagnosis not present

## 2024-02-05 DIAGNOSIS — J324 Chronic pansinusitis: Secondary | ICD-10-CM | POA: Diagnosis not present

## 2024-02-05 DIAGNOSIS — Z510 Encounter for antineoplastic radiation therapy: Secondary | ICD-10-CM | POA: Diagnosis not present

## 2024-02-05 DIAGNOSIS — C50212 Malignant neoplasm of upper-inner quadrant of left female breast: Secondary | ICD-10-CM | POA: Diagnosis not present

## 2024-02-05 DIAGNOSIS — Z170 Estrogen receptor positive status [ER+]: Secondary | ICD-10-CM | POA: Diagnosis not present

## 2024-02-05 LAB — RAD ONC ARIA SESSION SUMMARY
Course Elapsed Days: 13
Plan Fractions Treated to Date: 10
Plan Prescribed Dose Per Fraction: 2.66 Gy
Plan Total Fractions Prescribed: 16
Plan Total Prescribed Dose: 42.56 Gy
Reference Point Dosage Given to Date: 26.6 Gy
Reference Point Session Dosage Given: 2.66 Gy
Session Number: 10

## 2024-02-06 ENCOUNTER — Other Ambulatory Visit: Payer: Self-pay

## 2024-02-06 ENCOUNTER — Ambulatory Visit
Admission: RE | Admit: 2024-02-06 | Discharge: 2024-02-06 | Disposition: A | Source: Ambulatory Visit | Attending: Radiation Oncology

## 2024-02-06 DIAGNOSIS — Z170 Estrogen receptor positive status [ER+]: Secondary | ICD-10-CM | POA: Diagnosis not present

## 2024-02-06 DIAGNOSIS — C50212 Malignant neoplasm of upper-inner quadrant of left female breast: Secondary | ICD-10-CM | POA: Diagnosis not present

## 2024-02-06 DIAGNOSIS — Z510 Encounter for antineoplastic radiation therapy: Secondary | ICD-10-CM | POA: Diagnosis not present

## 2024-02-06 LAB — RAD ONC ARIA SESSION SUMMARY
Course Elapsed Days: 14
Plan Fractions Treated to Date: 11
Plan Prescribed Dose Per Fraction: 2.66 Gy
Plan Total Fractions Prescribed: 16
Plan Total Prescribed Dose: 42.56 Gy
Reference Point Dosage Given to Date: 29.26 Gy
Reference Point Session Dosage Given: 2.66 Gy
Session Number: 11

## 2024-02-07 ENCOUNTER — Ambulatory Visit
Admission: RE | Admit: 2024-02-07 | Discharge: 2024-02-07 | Disposition: A | Source: Ambulatory Visit | Attending: Radiation Oncology

## 2024-02-07 ENCOUNTER — Other Ambulatory Visit: Payer: Self-pay

## 2024-02-07 ENCOUNTER — Ambulatory Visit
Admission: RE | Admit: 2024-02-07 | Discharge: 2024-02-07 | Disposition: A | Discharge status: Home / Self Care | Source: Ambulatory Visit | Attending: Radiation Oncology | Admitting: Radiation Oncology

## 2024-02-07 DIAGNOSIS — Z510 Encounter for antineoplastic radiation therapy: Secondary | ICD-10-CM | POA: Diagnosis not present

## 2024-02-07 LAB — RAD ONC ARIA SESSION SUMMARY
Course Elapsed Days: 15
Plan Fractions Treated to Date: 12
Plan Prescribed Dose Per Fraction: 2.66 Gy
Plan Total Fractions Prescribed: 16
Plan Total Prescribed Dose: 42.56 Gy
Reference Point Dosage Given to Date: 31.92 Gy
Reference Point Session Dosage Given: 2.66 Gy
Session Number: 12

## 2024-02-08 ENCOUNTER — Other Ambulatory Visit: Payer: Self-pay

## 2024-02-08 ENCOUNTER — Ambulatory Visit
Admission: RE | Admit: 2024-02-08 | Discharge: 2024-02-08 | Disposition: A | Discharge status: Home / Self Care | Source: Ambulatory Visit | Attending: Radiation Oncology | Admitting: Radiation Oncology

## 2024-02-08 DIAGNOSIS — Z510 Encounter for antineoplastic radiation therapy: Secondary | ICD-10-CM | POA: Diagnosis not present

## 2024-02-08 LAB — RAD ONC ARIA SESSION SUMMARY
Course Elapsed Days: 16
Plan Fractions Treated to Date: 13
Plan Prescribed Dose Per Fraction: 2.66 Gy
Plan Total Fractions Prescribed: 16
Plan Total Prescribed Dose: 42.56 Gy
Reference Point Dosage Given to Date: 34.58 Gy
Reference Point Session Dosage Given: 2.66 Gy
Session Number: 13

## 2024-02-10 ENCOUNTER — Ambulatory Visit
Admission: RE | Admit: 2024-02-10 | Discharge: 2024-02-10 | Disposition: A | Source: Ambulatory Visit | Attending: Radiation Oncology

## 2024-02-10 ENCOUNTER — Other Ambulatory Visit: Payer: Self-pay

## 2024-02-10 DIAGNOSIS — Z510 Encounter for antineoplastic radiation therapy: Secondary | ICD-10-CM | POA: Diagnosis not present

## 2024-02-10 LAB — RAD ONC ARIA SESSION SUMMARY
Course Elapsed Days: 18
Plan Fractions Treated to Date: 14
Plan Prescribed Dose Per Fraction: 2.66 Gy
Plan Total Fractions Prescribed: 16
Plan Total Prescribed Dose: 42.56 Gy
Reference Point Dosage Given to Date: 37.24 Gy
Reference Point Session Dosage Given: 2.66 Gy
Session Number: 14

## 2024-02-11 ENCOUNTER — Other Ambulatory Visit: Payer: Self-pay

## 2024-02-11 ENCOUNTER — Ambulatory Visit
Admission: RE | Admit: 2024-02-11 | Discharge: 2024-02-11 | Disposition: A | Discharge status: Home / Self Care | Source: Ambulatory Visit | Attending: Radiation Oncology | Admitting: Radiation Oncology

## 2024-02-11 ENCOUNTER — Telehealth: Payer: Self-pay | Admitting: Cardiology

## 2024-02-11 DIAGNOSIS — Z510 Encounter for antineoplastic radiation therapy: Secondary | ICD-10-CM | POA: Diagnosis not present

## 2024-02-11 LAB — RAD ONC ARIA SESSION SUMMARY
Course Elapsed Days: 19
Plan Fractions Treated to Date: 15
Plan Prescribed Dose Per Fraction: 2.66 Gy
Plan Total Fractions Prescribed: 16
Plan Total Prescribed Dose: 42.56 Gy
Reference Point Dosage Given to Date: 39.9 Gy
Reference Point Session Dosage Given: 2.66 Gy
Session Number: 15

## 2024-02-11 NOTE — Telephone Encounter (Signed)
   Pre-operative Risk Assessment    Patient Name: Paige Klein  DOB: [birth year] MRN: [id number]   Date of last office visit: 07/10/2023 Date of next office visit: TBD   Request for Surgical Clearance    Procedure:  Functional endoscopic sinus with right maxillary antrostomy, total ethmodiectomy, frontal recess exploration bilateral inferior turbulent reductionunder image guidance  Date of Surgery:  Clearance TBD                                Surgeon:  Dr. Nadean  Surgeon's Group or Practice Name:  [facility] Phone number:  [phone] Fax number:  [phone]   Type of Clearance Requested:   - Medical  - Pharmacy:  Hold Prasugrel (Effient) TBD by Card   Type of Anesthesia:  General    Additional requests/questions:    [name]   02/11/2024, 8:18 AM

## 2024-02-12 ENCOUNTER — Ambulatory Visit
Admission: RE | Admit: 2024-02-12 | Discharge: 2024-02-12 | Disposition: A | Discharge status: Home / Self Care | Source: Ambulatory Visit | Attending: Radiation Oncology | Admitting: Radiation Oncology

## 2024-02-12 ENCOUNTER — Other Ambulatory Visit: Payer: Self-pay

## 2024-02-12 DIAGNOSIS — I872 Venous insufficiency (chronic) (peripheral): Secondary | ICD-10-CM | POA: Insufficient documentation

## 2024-02-12 DIAGNOSIS — Z170 Estrogen receptor positive status [ER+]: Secondary | ICD-10-CM | POA: Diagnosis not present

## 2024-02-12 DIAGNOSIS — C50212 Malignant neoplasm of upper-inner quadrant of left female breast: Secondary | ICD-10-CM | POA: Diagnosis not present

## 2024-02-12 DIAGNOSIS — Z510 Encounter for antineoplastic radiation therapy: Secondary | ICD-10-CM | POA: Diagnosis not present

## 2024-02-12 LAB — RAD ONC ARIA SESSION SUMMARY
Course Elapsed Days: 20
Plan Fractions Treated to Date: 16
Plan Prescribed Dose Per Fraction: 2.66 Gy
Plan Total Fractions Prescribed: 16
Plan Total Prescribed Dose: 42.56 Gy
Reference Point Dosage Given to Date: 42.56 Gy
Reference Point Session Dosage Given: 2.66 Gy
Session Number: 16

## 2024-02-12 NOTE — Telephone Encounter (Signed)
 Patient with diagnosis of A Fib on Eliquis  for anticoagulation.    Procedure: Functional endoscopic sinus with right maxillary antrostomy, total ethmodiectomy, frontal recess exploration bilateral inferior turbulent reductionunder image guidance  Date of procedure: TBD   CHA2DS2-VASc Score = 3  This indicates a 3.2% annual risk of stroke. The patient's score is based upon: CHF History: 0 HTN History: 0 Diabetes History: 0 Stroke History: 0 Vascular Disease History: 1 Age Score: 1 Gender Score: 1     CrCl 62 ml/min Platelet count 275K  Patient has not had an Afib/aflutter ablation in the last 3 months, DCCV within the last 4 weeks or a watchman implanted in the last 45 days    Per office protocol, patient can hold Eliquis  for 2 days prior to procedure.    **This guidance is not considered finalized until pre-operative APP has relayed final recommendations.**

## 2024-02-12 NOTE — Telephone Encounter (Signed)
   Name: Paige Klein  DOB: [birth year]  MRN: [id number]  Primary Cardiologist: Will Gladis Norton, MD   Preoperative team, please contact this patient and set up a phone call appointment for further preoperative risk assessment. Please obtain consent and complete medication review. Thank you for your help.  I confirm that guidance regarding antiplatelet and oral anticoagulation therapy has been completed and, if necessary, noted below.   Per office protocol, patient can hold Eliquis  for 2 days prior to procedure.   I also confirmed the patient resides in the state of [place] . As per [facility] Medical Board telemedicine laws, the patient must reside in the state in which the provider is licensed.   Damien JAYSON Braver, NP 02/12/2024, 1:45 PM [facility]

## 2024-02-13 ENCOUNTER — Other Ambulatory Visit: Payer: Self-pay

## 2024-02-13 ENCOUNTER — Ambulatory Visit
Admission: RE | Admit: 2024-02-13 | Discharge: 2024-02-13 | Disposition: A | Discharge status: Home / Self Care | Source: Ambulatory Visit | Attending: Radiation Oncology | Admitting: Radiation Oncology

## 2024-02-13 ENCOUNTER — Ambulatory Visit: Admission: RE | Admit: 2024-02-13 | Discharge: 2024-02-13 | Attending: Radiation Oncology

## 2024-02-13 ENCOUNTER — Ambulatory Visit

## 2024-02-13 DIAGNOSIS — Z510 Encounter for antineoplastic radiation therapy: Secondary | ICD-10-CM | POA: Diagnosis not present

## 2024-02-13 DIAGNOSIS — Z170 Estrogen receptor positive status [ER+]: Secondary | ICD-10-CM | POA: Diagnosis not present

## 2024-02-13 DIAGNOSIS — C50212 Malignant neoplasm of upper-inner quadrant of left female breast: Secondary | ICD-10-CM | POA: Diagnosis not present

## 2024-02-13 LAB — RAD ONC ARIA SESSION SUMMARY
Course Elapsed Days: 21
Plan Fractions Treated to Date: 1
Plan Prescribed Dose Per Fraction: 2 Gy
Plan Total Fractions Prescribed: 4
Plan Total Prescribed Dose: 8 Gy
Reference Point Dosage Given to Date: 2 Gy
Reference Point Session Dosage Given: 2 Gy
Session Number: 17

## 2024-02-18 ENCOUNTER — Ambulatory Visit

## 2024-02-18 ENCOUNTER — Ambulatory Visit
Admission: RE | Admit: 2024-02-18 | Discharge: 2024-02-18 | Disposition: A | Source: Ambulatory Visit | Attending: Radiation Oncology

## 2024-02-18 ENCOUNTER — Other Ambulatory Visit: Payer: Self-pay

## 2024-02-18 DIAGNOSIS — C50212 Malignant neoplasm of upper-inner quadrant of left female breast: Secondary | ICD-10-CM | POA: Diagnosis not present

## 2024-02-18 DIAGNOSIS — Z510 Encounter for antineoplastic radiation therapy: Secondary | ICD-10-CM | POA: Insufficient documentation

## 2024-02-18 DIAGNOSIS — Z170 Estrogen receptor positive status [ER+]: Secondary | ICD-10-CM | POA: Diagnosis not present

## 2024-02-18 LAB — RAD ONC ARIA SESSION SUMMARY
Course Elapsed Days: 26
Plan Fractions Treated to Date: 2
Plan Prescribed Dose Per Fraction: 2 Gy
Plan Total Fractions Prescribed: 4
Plan Total Prescribed Dose: 8 Gy
Reference Point Dosage Given to Date: 4 Gy
Reference Point Session Dosage Given: 2 Gy
Session Number: 18

## 2024-02-19 ENCOUNTER — Other Ambulatory Visit: Payer: Self-pay

## 2024-02-19 ENCOUNTER — Ambulatory Visit
Admission: RE | Admit: 2024-02-19 | Discharge: 2024-02-19 | Disposition: A | Source: Ambulatory Visit | Attending: Radiation Oncology

## 2024-02-19 DIAGNOSIS — Z510 Encounter for antineoplastic radiation therapy: Secondary | ICD-10-CM | POA: Diagnosis not present

## 2024-02-19 LAB — RAD ONC ARIA SESSION SUMMARY
Course Elapsed Days: 27
Plan Fractions Treated to Date: 3
Plan Prescribed Dose Per Fraction: 2 Gy
Plan Total Fractions Prescribed: 4
Plan Total Prescribed Dose: 8 Gy
Reference Point Dosage Given to Date: 6 Gy
Reference Point Session Dosage Given: 2 Gy
Session Number: 19

## 2024-02-20 ENCOUNTER — Ambulatory Visit
Admission: RE | Admit: 2024-02-20 | Discharge: 2024-02-20 | Disposition: A | Source: Ambulatory Visit | Attending: Radiation Oncology

## 2024-02-20 ENCOUNTER — Ambulatory Visit

## 2024-02-20 ENCOUNTER — Other Ambulatory Visit: Payer: Self-pay

## 2024-02-20 ENCOUNTER — Inpatient Hospital Stay: Admitting: Hematology and Oncology

## 2024-02-20 DIAGNOSIS — M810 Age-related osteoporosis without current pathological fracture: Secondary | ICD-10-CM | POA: Insufficient documentation

## 2024-02-20 DIAGNOSIS — C50212 Malignant neoplasm of upper-inner quadrant of left female breast: Secondary | ICD-10-CM | POA: Insufficient documentation

## 2024-02-20 DIAGNOSIS — Z923 Personal history of irradiation: Secondary | ICD-10-CM | POA: Insufficient documentation

## 2024-02-20 DIAGNOSIS — Z170 Estrogen receptor positive status [ER+]: Secondary | ICD-10-CM | POA: Diagnosis not present

## 2024-02-20 DIAGNOSIS — Z803 Family history of malignant neoplasm of breast: Secondary | ICD-10-CM | POA: Insufficient documentation

## 2024-02-20 DIAGNOSIS — E559 Vitamin D deficiency, unspecified: Secondary | ICD-10-CM | POA: Insufficient documentation

## 2024-02-20 DIAGNOSIS — Z87442 Personal history of urinary calculi: Secondary | ICD-10-CM | POA: Insufficient documentation

## 2024-02-20 DIAGNOSIS — Z510 Encounter for antineoplastic radiation therapy: Secondary | ICD-10-CM | POA: Diagnosis not present

## 2024-02-20 DIAGNOSIS — Z79811 Long term (current) use of aromatase inhibitors: Secondary | ICD-10-CM | POA: Insufficient documentation

## 2024-02-20 DIAGNOSIS — I341 Nonrheumatic mitral (valve) prolapse: Secondary | ICD-10-CM | POA: Insufficient documentation

## 2024-02-20 DIAGNOSIS — L598 Other specified disorders of the skin and subcutaneous tissue related to radiation: Secondary | ICD-10-CM | POA: Insufficient documentation

## 2024-02-20 DIAGNOSIS — Z8041 Family history of malignant neoplasm of ovary: Secondary | ICD-10-CM | POA: Insufficient documentation

## 2024-02-20 DIAGNOSIS — Z806 Family history of leukemia: Secondary | ICD-10-CM | POA: Insufficient documentation

## 2024-02-20 DIAGNOSIS — R21 Rash and other nonspecific skin eruption: Secondary | ICD-10-CM | POA: Insufficient documentation

## 2024-02-20 DIAGNOSIS — L299 Pruritus, unspecified: Secondary | ICD-10-CM | POA: Insufficient documentation

## 2024-02-20 DIAGNOSIS — Z8616 Personal history of COVID-19: Secondary | ICD-10-CM | POA: Insufficient documentation

## 2024-02-20 DIAGNOSIS — I4891 Unspecified atrial fibrillation: Secondary | ICD-10-CM | POA: Insufficient documentation

## 2024-02-20 DIAGNOSIS — Z79899 Other long term (current) drug therapy: Secondary | ICD-10-CM | POA: Insufficient documentation

## 2024-02-20 DIAGNOSIS — R5383 Other fatigue: Secondary | ICD-10-CM | POA: Insufficient documentation

## 2024-02-20 DIAGNOSIS — Z7901 Long term (current) use of anticoagulants: Secondary | ICD-10-CM | POA: Insufficient documentation

## 2024-02-20 DIAGNOSIS — Z800 Family history of malignant neoplasm of digestive organs: Secondary | ICD-10-CM | POA: Insufficient documentation

## 2024-02-20 DIAGNOSIS — E7800 Pure hypercholesterolemia, unspecified: Secondary | ICD-10-CM | POA: Insufficient documentation

## 2024-02-20 LAB — RAD ONC ARIA SESSION SUMMARY
Course Elapsed Days: 28
Plan Fractions Treated to Date: 4
Plan Prescribed Dose Per Fraction: 2 Gy
Plan Total Fractions Prescribed: 4
Plan Total Prescribed Dose: 8 Gy
Reference Point Dosage Given to Date: 8 Gy
Reference Point Session Dosage Given: 2 Gy
Session Number: 20

## 2024-02-20 MED ORDER — ANASTROZOLE 1 MG PO TABS: 1.0000 mg | ORAL_TABLET | Freq: Every day | ORAL | 3 refills | Status: AC

## 2024-02-20 NOTE — Progress Notes (Signed)
 [facility] CONSULT NOTE  Patient Care Team: Randol Dawes, MD as PCP - General (Family Medicine) Inocencio Soyla Lunger, MD as PCP - Cardiology (Cardiology) Inocencio Soyla Lunger, MD as PCP - Electrophysiology (Cardiology)  CHIEF COMPLAINTS/PURPOSE OF CONSULTATION:  Newly diagnosed breast cancer  HISTORY OF PRESENTING ILLNESS:  Paige Klein 72 y.o. female is here because of recent diagnosis of left breast cancer  I reviewed her records extensively and collaborated the history with the patient.  SUMMARY OF ONCOLOGIC HISTORY: Oncology History  Malignant neoplasm of upper-inner quadrant of left breast in female, estrogen receptor positive (HCC)  11/05/2023 Mammogram   There is a 9 mm irregular mass in the left breast is indeterminate. US  recommended.   11/14/2023 Pathology Results   1. Breast, left, needle core biopsy, 11:00 3cmfn :      - INVASIVE DUCTAL CARCINOMA, SEE NOTE      -  FOCAL DUCTAL CARCINOMA IN SITU, NUCLEAR GRADE 2 OF 3      - TUBULE FORMATION: SCORE 3/3      - NUCLEAR PLEOMORPHISM: SCORE 2/3      - MITOTIC COUNT: SCORE 1/3      - TOTAL SCORE: 6/9      - OVERALL GRADE: II/III      - LYMPHOVASCULAR INVASION: NOT IDENTIFIED      - CANCER LENGTH: 3.5 MM IN GREATEST LINEAR DIMENSION ON HEAVILY FRAGMENTED      CORES.      - CALCIFICATIONS: NOT IDENTIFIED      - OTHER FINDINGS: N/A      NOTE:      DR. [facility] REVIEWED THE CASE AND CONCURS WITH THE INTERPRETATION.  A BREAST      PROGNOSTIC PROFILE (ER, PR, KI-67 AND HER2) IS PENDING AND WILL BE REPORTED IN      AN ADDENDUM.  [facility] WAS NOTIFIED ON 11/15/2023.       2. Breast, left, needle core biopsy, 11:00 1cmfn :      -  BENIGN BREAST TISSUE WITH FIBROCYSTIC CHANGE AND FOCAL USUAL DUCTAL      HYPERPLASIA.  The tumor cells are NEGATIVE for Her2 (1+). Estrogen Receptor:   100%, POSITIVE, STRONG STAINING INTENSITY Progesterone Receptor:  100%, POSITIVE, STRONG STAINING  INTENSITY Proliferation Marker Ki-67:      5%     11/20/2023 Initial Diagnosis   Malignant neoplasm of upper-inner quadrant of left breast in female, estrogen receptor positive (HCC)   11/21/2023 Cancer Staging   Staging form: Breast, AJCC 8th Edition - Clinical stage from 11/21/2023: Stage IA (cT1c, cN0, cM0, G2, ER+, PR+, HER2-) - Signed by Lanell Donald Stagger, PA-C on 11/21/2023 Stage prefix: Initial diagnosis Method of lymph node assessment: Clinical Histologic grading system: 3 grade system   12/05/2023 Genetic Testing   Negative genetic testing on the CancerNext-Expanded+RNAinsight panel.  The report date is 12/05/2023.  The CancerNext-Expanded gene panel offered by [facility] and includes sequencing, rearrangement, and RNA analysis for the following 77 genes: AIP, ALK, APC, ATM, BAP1, BARD1, BMPR1A, BRCA1, BRCA2, BRIP1, CDC73, CDH1, CDK4, CDKN1B, CDKN2A, CEBPA, CHEK2, CTNNA1, DDX41, DICER1, ETV6, FH, FLCN, GATA2, LZTR1, MAX, MBD4, MEN1, MET, MLH1, MSH2, MSH3, MSH6, MUTYH, NF1, NF2, NTHL1, PALB2, PHOX2B, PMS2, POT1, PRKAR1A, PTCH1, PTEN, RAD51C, RAD51D, RB1, RET, RPS20, RUNX1, SDHA, SDHAF2, SDHB, SDHC, SDHD, SMAD4, SMARCA4, SMARCB1, SMARCE1, STK11, SUFU, TMEM127, TP53, TSC1, TSC2, VHL, and WT1 (sequencing and deletion/duplication); AXIN2, CTNNA1, DDX41, EGFR, HOXB13, KIT, MBD4, MITF, MSH3, PDGFRA, POLD1 and POLE (  sequencing only); EPCAM and GREM1 (deletion/duplication only). RNA data is routinely analyzed for use in variant interpretation for all genes.      Discussed the use of AI scribe software for clinical note transcription with the patient, who gave verbal consent to proceed.  History of Present Illness  Paige Klein is a 73 year old female with breast cancer who presents for follow-up after completing radiation therapy.  She experiences increased fatigue over the last few days, which was initially well-tolerated. Today marks the last day of her radiation therapy.  She  has developed a rash in the treatment area, which is slightly pruritic. She has been using a cream to manage it, and it is not causing significant problems.  She is concerned about recovery from radiation, particularly regarding returning to normal activities and exercise.  She has a history of osteoporosis, for which she is on Prolia .   Rest of the pertinent 10 point ROS reviewed and neg  MEDICAL HISTORY:  Past Medical History:  Diagnosis Date   A-fib (HCC) 05/22/2017   Abnormal Pap smear of cervix 1999   ASGUS x 1   Allergic rhinitis, cause unspecified    mold; and vasomotor rhinitis   Bradycardia 02/25/2014   Cataract    COVID-19 03/2020   Dysrhythmia    Family history of breast cancer    Family history of ovarian cancer    Family history of pancreatic cancer    Fibrocystic breast changes    and also breast calcifications (08/2008)   Glaucoma    Glaucoma    followed by [facility] Dr. Geneva   Glaucoma suspect of both eyes    high-normal intraocular pressures (Dr. Robinson)   H/O bone density study 09/07/2009   08/16/07, 08/08/05; osteoporosis   H/O mammogram 11/23/2010   normal   Hypercholesteremia    borderline per NMR lipoprofile 2008, 2007   Internal hemorrhoids    Kidney stone 1986   right   Migraine headache    resolved with menopause (menstrual migraines)   MVP (mitral valve prolapse)    per echo   Osteoporosis    Osteoporosis 02/22/2012   Pap smear for cervical cancer screening 04/14/2009   negative for malignancy, +atrophic changes   Postmenopausal    on compounded cream daily   Unspecified vitamin D  deficiency 2009   Vitamin D  deficiency 02/22/2012    SURGICAL HISTORY: Past Surgical History:  Procedure Laterality Date   BREAST CYST ASPIRATION     many (Dr. Debby)   BREAST FIBROADENOMA SURGERY  11/18/2001   Dr.Gooden   BREAST LUMPECTOMY Left 12/12/2023   cardiolite (stress test)  01/19/2004   Dr. Harl   cataract surgery  03/20/2008   bilateral    CERVICAL POLYPECTOMY  03/20/2001   treated with cryosurgery   COLONOSCOPY  08/02/11; 05/2000   Dr. Kristie - rectal polyps 2013   DILATION AND CURETTAGE OF UTERUS  05/18/2009   uterine polyp, bleeding   endocervical cyst  12/96, 6/97   Dr Darina   EYE SURGERY     KYPHOPLASTY N/A 04/29/2015   Procedure: KYPHOPLASTY;  Surgeon: Oneil Priestly, MD;  [facility];  Service: Orthopedics;  Laterality: N/A;  Lumbar 1 kyphoplasty   PATELLAR TENDON REPAIR Left 10/27/2020   Procedure: PATELLA TENDON REPAIR;  Surgeon: Cristy Bonner DASEN, MD;  Location: [facility] OR;  Service: Orthopedics;  Laterality: Left;   PATELLECTOMY Left 10/27/2020   Procedure: PATELLECTOMY PARTIAL;  Surgeon: Cristy Bonner DASEN, MD;  Location: [facility] OR;  Service: Orthopedics;  Laterality: Left;    SOCIAL HISTORY: Social History   Socioeconomic History   Marital status: Divorced    Spouse name: Not on file   Number of children: 0   Years of education: Not on file   Highest education level: Not on file  Occupational History   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   Lives alone   (Boyfriend Zachary lives in [place])      Updated 07/2023   Social Drivers of Health   Financial Resource Strain: Not on file  Food Insecurity: No Food Insecurity (11/21/2023)   Hunger Vital Sign    Worried About Running Out of Food in the Last Year: Never true    Ran Out of Food in the Last Year: Never true  Transportation Needs: No Transportation Needs (11/21/2023)   PRAPARE - Therapist, Art (Medical): No    Lack of Transportation (Non-Medical): No  Physical Activity: Not on file  Stress: Not on file  Social Connections: Not on file  Intimate Partner Violence: Not At Risk (11/21/2023)   Humiliation, Afraid, Rape, and Kick questionnaire    Fear of Current or Ex-Partner: No    Emotionally Abused: No    Physically Abused: No    Sexually Abused: No    FAMILY HISTORY: Family History  Problem Relation Age of Onset   Hypertension Mother    Arthritis Mother    Breast cancer Mother        breast cancer in her 32's and 30's (bilateral)   Hypertension Father    Other Father        died of pancreatitis   Macular degeneration Sister    Muscular dystrophy Maternal Aunt        adult onset   Diabetes Maternal Aunt    Polymyalgia rheumatica Maternal Aunt    Celiac disease Maternal Uncle    Ovarian cancer Maternal Grandmother    Cervical cancer Maternal Grandmother    Pancreatic cancer Maternal Grandfather    Cancer Cousin        mouth (nonsmoker)   Ovarian cancer Cousin 48   Arthritis Cousin    Leukemia Nephew 30 - 40       CML    ALLERGIES:  is allergic to brinzolamide-brimonidine, cortisone, fosamax  [alendronate  sodium], timolol, and tramadol .  MEDICATIONS:  Current Outpatient Medications  Medication Sig Dispense Refill   anastrozole  (ARIMIDEX ) 1 MG tablet Take 1 tablet (1 mg total) by mouth daily. 90 tablet 3   apixaban  (ELIQUIS ) 5 MG TABS tablet TAKE 1 TABLET BY MOUTH TWICE A DAY 180 tablet 1   calcium  citrate-vitamin D  (CITRACAL+D) 315-200 MG-UNIT tablet Take 1 tablet by mouth at bedtime.     co-enzyme Q-10 30 MG capsule Take 30 mg by mouth 3 (three) times daily.     denosumab  (PROLIA ) 60 MG/ML SOLN injection Inject 60 mg into the skin every 6 (six) months. Administer in upper  arm, thigh, or abdomen     diltiazem  (CARDIZEM ) 30 MG tablet Take 30 mg by mouth as needed (a-fib).     dorzolamide  (TRUSOPT ) 2 % ophthalmic solution Place 1 drop  into both eyes 3 (three) times daily.  3   latanoprost  (XALATAN ) 0.005 % ophthalmic solution Place 1 drop into both eyes at bedtime.     Loratadine 10 MG CAPS Take by mouth.     LUTEIN PO Take 1 capsule by mouth at bedtime.     Multiple Vitamins-Minerals (CENTRUM SILVER PO) Take 1 tablet by mouth daily.     Olopatadine  HCl (PATADAY  OP) Apply 1 drop to eye as directed.     oxyCODONE  (OXY IR/ROXICODONE ) 5 MG immediate release tablet Take 1 tablet (5 mg total) by mouth every 6 (six) hours as needed for severe pain (pain score 7-10). 5 tablet 0   pilocarpine  (PILOCAR) 4 % ophthalmic solution Place 1 drop into both eyes 3 (three) times daily.     rosuvastatin  (CRESTOR ) 20 MG tablet TAKE 1 TABLET (20 MG TOTAL) BY MOUTH 2 DAYS PER WEEK 25 tablet 2   No current facility-administered medications for this visit.    REVIEW OF SYSTEMS:   Constitutional: Denies fevers, chills or abnormal night sweats Eyes: Denies blurriness of vision, double vision or watery eyes Ears, nose, mouth, throat, and face: Denies mucositis or sore throat Respiratory: Denies cough, dyspnea or wheezes Cardiovascular: Denies palpitation, chest discomfort or lower extremity swelling Gastrointestinal:  Denies nausea, heartburn or change in bowel habits Skin: Denies abnormal skin rashes Lymphatics: Denies new lymphadenopathy or easy bruising Neurological:Denies numbness, tingling or new weaknesses Behavioral/Psych: Mood is stable, no new changes  Breast: Denies any palpable lumps or discharge All other systems were reviewed with the patient and are negative.  PHYSICAL EXAMINATION: ECOG PERFORMANCE STATUS: 0 - Asymptomatic  There were no vitals filed for this visit.  There were no vitals filed for this visit.   GENERAL:alert, no distress and comfortable  LABORATORY DATA:  I have reviewed the data as listed Lab Results  Component Value Date   WBC 5.5 11/21/2023   HGB 12.6 11/21/2023   HCT 38.9 11/21/2023   MCV 94.2  11/21/2023   PLT 275 11/21/2023   Lab Results  Component Value Date   NA 140 11/21/2023   K 4.3 11/21/2023   CL 108 11/21/2023   CO2 27 11/21/2023    RADIOGRAPHIC STUDIES: I have personally reviewed the radiological reports and agreed with the findings in the report.  ASSESSMENT AND PLAN:   Assessment and Plan Assessment & Plan Invasive ductal carcinoma of left breast, ER/PR positive, HER2 negative Intermediate grade, hormone receptor-positive, HER2 negative.  Completed radiation therapy. Initiated aromatase inhibitors  in a couple weeks.Discussed side effects including hot flashes, vaginal dryness, bone density loss and cardiovascular risk.  - Start aromatase inhibitor therapy on January 1st or 2nd. - Sent prescription to [facility] on [address]. - Schedule follow-up appointment in April for survivorship visit.  Radiation dermatitis of breast Developed mild itchy rash, likely radiation dermatitis. - Continue FU with radiation as prescribed.  Fatigue secondary to radiation therapy Increased fatigue likely due to radiation therapy, expected to persist for weeks. - Advised gradual return to normal activities as tolerated.  Osteoporosis on Prolia  Osteoporosis managed with Prolia . - Continue Prolia  for osteoporosis management.   All questions were answered. The patient knows to call the clinic with any problems, questions or concerns.    Amber Stalls, MD 02/20/24

## 2024-02-21 ENCOUNTER — Ambulatory Visit

## 2024-02-21 NOTE — Radiation Completion Notes (Addendum)
" °  Radiation Oncology         [phone] ________________________________  Name: Paige Klein MRN: [id number]  Date of Service: 02/20/2024  DOB: September 05, 1950  End of Treatment Note     Diagnosis:  Stage IA, pT1c, cN0M0, grade 2, ER/PR positive invasive ductal carcinoma with associated intermediate grade DCIS of the left breast.   Intent: Curative     ==========DELIVERED PLANS==========  First Treatment Date: 2024-01-23 Last Treatment Date: 2024-02-20   Plan Name: Breast_L_BH Site: Breast, Left Technique: 3D Mode: Photon Dose Per Fraction: 2.66 Gy Prescribed Dose (Delivered / Prescribed): 42.56 Gy / 42.56 Gy Prescribed Fxs (Delivered / Prescribed): 16 / 16   Plan Name: Brst_L_BH_Bst Site: Breast, Left Technique: 3D Mode: Photon Dose Per Fraction: 2 Gy Prescribed Dose (Delivered / Prescribed): 8 Gy / 8 Gy Prescribed Fxs (Delivered / Prescribed): 4 / 4     ==========ON TREATMENT VISIT DATES========== 2024-01-25, 2024-02-01, 2024-02-07, 2024-02-13      See weekly On Treatment Notes in Epic for details in the Media tab (listed as Progress notes on the On Treatment Visit Dates listed above).The patient tolerated radiation. She developed fatigue and anticipated skin changes in the treatment field.   The patient will receive a call in about one month from the radiation oncology department. She will continue follow up with Dr. Loretha as well.      Donald KYM Husband, PAC    "

## 2024-03-10 NOTE — Progress Notes (Signed)
" °  Radiation Oncology         [phone] ________________________________  Name: Paige Klein MRN: [id number]  Date of Service: 03/24/2024  DOB: March 20, 1951  Post Treatment Telephone Note  Diagnosis:  Malignant neoplasm of upper-inner quadrant of left female breast Malignant neoplasm of upper-inner quadrant of left breast in female, estrogen receptor positive  First Treatment Date: 2024-01-23 Last Treatment Date: 2024-02-20   Plan Name: Breast_L_BH Site: Breast, Left Technique: 3D Mode: Photon Dose Per Fraction: 2.66 Gy Prescribed Dose (Delivered / Prescribed): 42.56 Gy / 42.56 Gy Prescribed Fxs (Delivered / Prescribed): 16 / 16   Plan Name: Brst_L_BH_Bst Site: Breast, Left Technique: 3D Mode: Photon Dose Per Fraction: 2 Gy Prescribed Dose (Delivered / Prescribed): 8 Gy / 8 Gy Prescribed Fxs (Delivered / Prescribed): 4 / 4  The patient was available for call today.   Symptoms of fatigue have improved since completing therapy.  Symptoms of skin changes have improved since completing therapy.  The patient was encouraged to avoid sun exposure in the area of prior treatment for up to one year following radiation with either sunscreen or by the style of clothing worn in the sun.  The patient has scheduled follow up with her medical oncologist Dr. Loretha for ongoing surveillance, and was encouraged to call if she develops concerns or questions regarding radiation.   "

## 2024-03-20 ENCOUNTER — Encounter: Payer: Self-pay | Admitting: Family Medicine

## 2024-03-21 ENCOUNTER — Encounter: Payer: Self-pay | Admitting: Family Medicine

## 2024-03-21 ENCOUNTER — Telehealth: Payer: Self-pay

## 2024-03-21 NOTE — Telephone Encounter (Signed)
 Auth Submission: APPROVED Site of care: Site of care: [facility] Payer: [employer] medicare Medication & CPT/J Code(s) submitted: Jubbonti [id number] Diagnosis Code:  Route of submission (phone, fax, portal): portal - Availty Phone # Fax # Auth type: Buy/Bill PB Units/visits requested: 60mg  x 2 doses Reference number: [id number] Approval from: 03/21/24 to 03/19/25   Approval letter is in the media tab.

## 2024-03-24 ENCOUNTER — Ambulatory Visit
Admission: RE | Admit: 2024-03-24 | Discharge: 2024-03-24 | Disposition: A | Discharge status: Home / Self Care | Source: Ambulatory Visit | Attending: Radiation Oncology | Admitting: Radiation Oncology

## 2024-03-24 ENCOUNTER — Ambulatory Visit

## 2024-03-24 DIAGNOSIS — Z170 Estrogen receptor positive status [ER+]: Secondary | ICD-10-CM

## 2024-03-27 ENCOUNTER — Ambulatory Visit (INDEPENDENT_AMBULATORY_CARE_PROVIDER_SITE_OTHER)

## 2024-03-27 ENCOUNTER — Other Ambulatory Visit (HOSPITAL_COMMUNITY): Payer: Self-pay | Admitting: Family Medicine

## 2024-03-27 VITALS — BP 126/53 | HR 60 | Temp 98.6°F | Resp 14 | Ht 67.0 in | Wt 144.8 lb

## 2024-03-27 DIAGNOSIS — M810 Age-related osteoporosis without current pathological fracture: Secondary | ICD-10-CM

## 2024-03-27 MED ORDER — DENOSUMAB-BBDZ 60 MG/ML ~~LOC~~ SOSY
60.0000 mg | PREFILLED_SYRINGE | Freq: Once | SUBCUTANEOUS | Status: AC
  Administered 2024-03-27: 60 mg via SUBCUTANEOUS
  Filled 2024-03-27: qty 1

## 2024-03-27 MED ORDER — DENOSUMAB-BBDZ 60 MG/ML ~~LOC~~ SOSY
60.0000 mg | PREFILLED_SYRINGE | Freq: Once | SUBCUTANEOUS | Status: DC
  Filled 2024-03-27: qty 1

## 2024-03-27 NOTE — Progress Notes (Signed)
 Diagnosis:  Osteoporosis  Provider:  Mannam, Praveen MD  Procedure: Injection  Jubbonti , Dose: 60 mg, Site: subcutaneous, Number of injections: 1  Injection Site(s): Left arm  Post Care: Observation period completed  Discharge: Condition: Good, Destination: Home . AVS Provided and AVS Declined  Performed by:  Maximiano JONELLE Pouch, LPN

## 2024-04-17 ENCOUNTER — Encounter: Payer: Self-pay | Admitting: Cardiology

## 2024-06-05 ENCOUNTER — Inpatient Hospital Stay: Admitting: Adult Health

## 2024-06-05 ENCOUNTER — Inpatient Hospital Stay

## 2024-07-02 ENCOUNTER — Ambulatory Visit: Admitting: Student

## 2024-08-25 ENCOUNTER — Encounter: Payer: Self-pay | Admitting: Family Medicine

## 2024-09-25 ENCOUNTER — Ambulatory Visit
